# Patient Record
Sex: Female | Born: 1955 | Race: White | Hispanic: No | Smoking: Former smoker
Health system: Southern US, Community
[De-identification: ages and names within clinical notes are randomized; demographics above are authoritative.]

## PROBLEM LIST (undated history)

## (undated) DIAGNOSIS — H04129 Dry eye syndrome of unspecified lacrimal gland: Secondary | ICD-10-CM

## (undated) DIAGNOSIS — F32A Depression, unspecified: Secondary | ICD-10-CM

## (undated) DIAGNOSIS — M5432 Sciatica, left side: Secondary | ICD-10-CM

## (undated) DIAGNOSIS — I10 Essential (primary) hypertension: Secondary | ICD-10-CM

## (undated) DIAGNOSIS — R55 Syncope and collapse: Secondary | ICD-10-CM

## (undated) DIAGNOSIS — Z973 Presence of spectacles and contact lenses: Secondary | ICD-10-CM

## (undated) DIAGNOSIS — R350 Frequency of micturition: Secondary | ICD-10-CM

## (undated) DIAGNOSIS — Z9889 Other specified postprocedural states: Secondary | ICD-10-CM

## (undated) DIAGNOSIS — C439 Malignant melanoma of skin, unspecified: Secondary | ICD-10-CM

## (undated) DIAGNOSIS — I350 Nonrheumatic aortic (valve) stenosis: Secondary | ICD-10-CM

## (undated) DIAGNOSIS — Z8489 Family history of other specified conditions: Secondary | ICD-10-CM

## (undated) DIAGNOSIS — G5601 Carpal tunnel syndrome, right upper limb: Secondary | ICD-10-CM

## (undated) DIAGNOSIS — E039 Hypothyroidism, unspecified: Secondary | ICD-10-CM

## (undated) DIAGNOSIS — J449 Chronic obstructive pulmonary disease, unspecified: Secondary | ICD-10-CM

## (undated) DIAGNOSIS — G5603 Carpal tunnel syndrome, bilateral upper limbs: Secondary | ICD-10-CM

## (undated) DIAGNOSIS — F419 Anxiety disorder, unspecified: Secondary | ICD-10-CM

## (undated) DIAGNOSIS — B009 Herpesviral infection, unspecified: Secondary | ICD-10-CM

## (undated) DIAGNOSIS — R112 Nausea with vomiting, unspecified: Secondary | ICD-10-CM

## (undated) DIAGNOSIS — R011 Cardiac murmur, unspecified: Secondary | ICD-10-CM

## (undated) DIAGNOSIS — M1990 Unspecified osteoarthritis, unspecified site: Secondary | ICD-10-CM

## (undated) DIAGNOSIS — Z972 Presence of dental prosthetic device (complete) (partial): Secondary | ICD-10-CM

## (undated) DIAGNOSIS — F329 Major depressive disorder, single episode, unspecified: Secondary | ICD-10-CM

## (undated) DIAGNOSIS — C4491 Basal cell carcinoma of skin, unspecified: Secondary | ICD-10-CM

## (undated) DIAGNOSIS — K219 Gastro-esophageal reflux disease without esophagitis: Secondary | ICD-10-CM

## (undated) DIAGNOSIS — D069 Carcinoma in situ of cervix, unspecified: Secondary | ICD-10-CM

## (undated) DIAGNOSIS — M65311 Trigger thumb, right thumb: Secondary | ICD-10-CM

## (undated) DIAGNOSIS — I351 Nonrheumatic aortic (valve) insufficiency: Secondary | ICD-10-CM

## (undated) DIAGNOSIS — S12100A Unspecified displaced fracture of second cervical vertebra, initial encounter for closed fracture: Secondary | ICD-10-CM

## (undated) DIAGNOSIS — I639 Cerebral infarction, unspecified: Secondary | ICD-10-CM

## (undated) DIAGNOSIS — M797 Fibromyalgia: Secondary | ICD-10-CM

## (undated) HISTORY — DX: Depression, unspecified: F32.A

## (undated) HISTORY — DX: Cerebral infarction, unspecified: I63.9

## (undated) HISTORY — DX: Fibromyalgia: M79.7

## (undated) HISTORY — PX: TUBAL LIGATION: SHX77

## (undated) HISTORY — PX: CARPAL TUNNEL RELEASE: SHX101

## (undated) HISTORY — DX: Nonrheumatic aortic (valve) stenosis: I35.0

## (undated) HISTORY — DX: Herpesviral infection, unspecified: B00.9

## (undated) HISTORY — PX: SKIN SURGERY: SHX2413

## (undated) HISTORY — DX: Nonrheumatic aortic (valve) insufficiency: I35.1

## (undated) HISTORY — PX: OTHER SURGICAL HISTORY: SHX169

## (undated) HISTORY — DX: Major depressive disorder, single episode, unspecified: F32.9

## (undated) HISTORY — PX: MOUTH SURGERY: SHX715

## (undated) HISTORY — DX: Basal cell carcinoma of skin, unspecified: C44.91

## (undated) HISTORY — PX: FEMUR FRACTURE SURGERY: SHX633

## (undated) HISTORY — PX: WISDOM TOOTH EXTRACTION: SHX21

---

## 1982-11-25 DIAGNOSIS — I639 Cerebral infarction, unspecified: Secondary | ICD-10-CM

## 1982-11-25 DIAGNOSIS — S060XAA Concussion with loss of consciousness status unknown, initial encounter: Secondary | ICD-10-CM

## 1982-11-25 DIAGNOSIS — S329XXA Fracture of unspecified parts of lumbosacral spine and pelvis, initial encounter for closed fracture: Secondary | ICD-10-CM

## 1982-11-25 DIAGNOSIS — S12100A Unspecified displaced fracture of second cervical vertebra, initial encounter for closed fracture: Secondary | ICD-10-CM

## 1982-11-25 DIAGNOSIS — S060X9A Concussion with loss of consciousness of unspecified duration, initial encounter: Secondary | ICD-10-CM

## 1982-11-25 HISTORY — PX: MOUTH SURGERY: SHX715

## 1982-11-25 HISTORY — PX: FEMUR FRACTURE SURGERY: SHX633

## 1982-11-25 HISTORY — DX: Concussion with loss of consciousness of unspecified duration, initial encounter: S06.0X9A

## 1982-11-25 HISTORY — DX: Unspecified displaced fracture of second cervical vertebra, initial encounter for closed fracture: S12.100A

## 1982-11-25 HISTORY — DX: Cerebral infarction, unspecified: I63.9

## 1982-11-25 HISTORY — DX: Concussion with loss of consciousness status unknown, initial encounter: S06.0XAA

## 1982-11-25 HISTORY — DX: Fracture of unspecified parts of lumbosacral spine and pelvis, initial encounter for closed fracture: S32.9XXA

## 1982-11-25 HISTORY — PX: NECK SURGERY: SHX720

## 1998-10-13 ENCOUNTER — Other Ambulatory Visit: Admission: RE | Admit: 1998-10-13 | Discharge: 1998-10-13 | Payer: Self-pay | Admitting: Obstetrics and Gynecology

## 1998-10-16 ENCOUNTER — Other Ambulatory Visit: Admission: RE | Admit: 1998-10-16 | Discharge: 1998-10-16 | Payer: Self-pay | Admitting: Obstetrics and Gynecology

## 1998-11-02 ENCOUNTER — Encounter: Payer: Self-pay | Admitting: Obstetrics and Gynecology

## 1998-11-02 ENCOUNTER — Ambulatory Visit (HOSPITAL_COMMUNITY): Admission: RE | Admit: 1998-11-02 | Discharge: 1998-11-02 | Payer: Self-pay | Admitting: Obstetrics and Gynecology

## 1999-04-12 ENCOUNTER — Other Ambulatory Visit: Admission: RE | Admit: 1999-04-12 | Discharge: 1999-04-12 | Payer: Self-pay | Admitting: Obstetrics and Gynecology

## 1999-12-12 ENCOUNTER — Ambulatory Visit (HOSPITAL_COMMUNITY): Admission: RE | Admit: 1999-12-12 | Discharge: 1999-12-12 | Payer: Self-pay | Admitting: Obstetrics and Gynecology

## 1999-12-12 ENCOUNTER — Encounter: Payer: Self-pay | Admitting: Obstetrics and Gynecology

## 2000-05-30 ENCOUNTER — Ambulatory Visit (HOSPITAL_COMMUNITY): Admission: RE | Admit: 2000-05-30 | Discharge: 2000-05-30 | Payer: Self-pay | Admitting: Obstetrics and Gynecology

## 2000-12-16 ENCOUNTER — Encounter: Admission: RE | Admit: 2000-12-16 | Discharge: 2000-12-16 | Payer: Self-pay | Admitting: Obstetrics & Gynecology

## 2000-12-16 ENCOUNTER — Encounter: Payer: Self-pay | Admitting: Obstetrics & Gynecology

## 2000-12-22 ENCOUNTER — Other Ambulatory Visit: Admission: RE | Admit: 2000-12-22 | Discharge: 2000-12-22 | Payer: Self-pay | Admitting: Obstetrics & Gynecology

## 2001-05-05 ENCOUNTER — Other Ambulatory Visit: Admission: RE | Admit: 2001-05-05 | Discharge: 2001-05-05 | Payer: Self-pay | Admitting: Obstetrics & Gynecology

## 2001-08-27 ENCOUNTER — Encounter: Payer: Self-pay | Admitting: Occupational Medicine

## 2001-08-27 ENCOUNTER — Encounter: Admission: RE | Admit: 2001-08-27 | Discharge: 2001-08-27 | Payer: Self-pay | Admitting: Occupational Medicine

## 2001-10-27 ENCOUNTER — Other Ambulatory Visit: Admission: RE | Admit: 2001-10-27 | Discharge: 2001-10-27 | Payer: Self-pay | Admitting: Obstetrics & Gynecology

## 2001-11-02 ENCOUNTER — Encounter: Payer: Self-pay | Admitting: Family Medicine

## 2001-11-02 ENCOUNTER — Ambulatory Visit (HOSPITAL_COMMUNITY): Admission: RE | Admit: 2001-11-02 | Discharge: 2001-11-02 | Payer: Self-pay | Admitting: Family Medicine

## 2002-01-01 ENCOUNTER — Encounter: Admission: RE | Admit: 2002-01-01 | Discharge: 2002-01-01 | Payer: Self-pay | Admitting: Family Medicine

## 2002-01-01 ENCOUNTER — Encounter: Payer: Self-pay | Admitting: Family Medicine

## 2002-03-18 ENCOUNTER — Encounter: Payer: Self-pay | Admitting: Obstetrics & Gynecology

## 2002-03-18 ENCOUNTER — Encounter: Admission: RE | Admit: 2002-03-18 | Discharge: 2002-03-18 | Payer: Self-pay | Admitting: Obstetrics & Gynecology

## 2002-08-03 ENCOUNTER — Other Ambulatory Visit: Admission: RE | Admit: 2002-08-03 | Discharge: 2002-08-03 | Payer: Self-pay | Admitting: Obstetrics & Gynecology

## 2002-11-15 ENCOUNTER — Encounter: Payer: Self-pay | Admitting: Family Medicine

## 2002-11-15 ENCOUNTER — Encounter: Admission: RE | Admit: 2002-11-15 | Discharge: 2002-11-15 | Payer: Self-pay | Admitting: Family Medicine

## 2003-05-02 ENCOUNTER — Encounter: Payer: Self-pay | Admitting: Obstetrics & Gynecology

## 2003-05-02 ENCOUNTER — Encounter: Admission: RE | Admit: 2003-05-02 | Discharge: 2003-05-02 | Payer: Self-pay | Admitting: Obstetrics & Gynecology

## 2003-05-16 ENCOUNTER — Other Ambulatory Visit: Admission: RE | Admit: 2003-05-16 | Discharge: 2003-05-16 | Payer: Self-pay | Admitting: Obstetrics & Gynecology

## 2003-11-29 ENCOUNTER — Other Ambulatory Visit: Admission: RE | Admit: 2003-11-29 | Discharge: 2003-11-29 | Payer: Self-pay | Admitting: Obstetrics & Gynecology

## 2005-02-01 ENCOUNTER — Other Ambulatory Visit: Admission: RE | Admit: 2005-02-01 | Discharge: 2005-02-01 | Payer: Self-pay | Admitting: Obstetrics & Gynecology

## 2008-08-03 ENCOUNTER — Other Ambulatory Visit: Admission: RE | Admit: 2008-08-03 | Discharge: 2008-08-03 | Payer: Self-pay | Admitting: Obstetrics & Gynecology

## 2008-08-31 ENCOUNTER — Encounter: Admission: RE | Admit: 2008-08-31 | Discharge: 2008-08-31 | Payer: Self-pay | Admitting: Obstetrics & Gynecology

## 2009-11-06 ENCOUNTER — Ambulatory Visit (HOSPITAL_COMMUNITY): Admission: RE | Admit: 2009-11-06 | Discharge: 2009-11-06 | Payer: Self-pay | Admitting: Obstetrics & Gynecology

## 2010-01-05 ENCOUNTER — Ambulatory Visit: Payer: Self-pay | Admitting: Vascular Surgery

## 2010-11-25 HISTORY — PX: CARPAL TUNNEL RELEASE: SHX101

## 2010-12-16 ENCOUNTER — Encounter: Payer: Self-pay | Admitting: Obstetrics & Gynecology

## 2011-01-01 ENCOUNTER — Emergency Department: Payer: Self-pay | Admitting: Unknown Physician Specialty

## 2011-03-01 ENCOUNTER — Other Ambulatory Visit: Payer: Self-pay | Admitting: Obstetrics & Gynecology

## 2011-03-01 DIAGNOSIS — Z139 Encounter for screening, unspecified: Secondary | ICD-10-CM

## 2011-03-07 ENCOUNTER — Ambulatory Visit (HOSPITAL_COMMUNITY)
Admission: RE | Admit: 2011-03-07 | Discharge: 2011-03-07 | Disposition: A | Discharge status: Home / Self Care | Payer: Medicare Other | Source: Ambulatory Visit | Attending: Obstetrics & Gynecology | Admitting: Obstetrics & Gynecology

## 2011-03-07 DIAGNOSIS — Z1231 Encounter for screening mammogram for malignant neoplasm of breast: Secondary | ICD-10-CM | POA: Insufficient documentation

## 2011-03-07 DIAGNOSIS — Z139 Encounter for screening, unspecified: Secondary | ICD-10-CM

## 2011-04-09 NOTE — Procedures (Signed)
CAROTID DUPLEX EXAM   INDICATION:  Right carotid bruit.   HISTORY:  Diabetes:  No.  Cardiac:  No.  Hypertension:  No.  Smoking:  Yes.  Previous Surgery:  No.  CV History:  CVA in 1984.  Amaurosis Fugax No, Paresthesias No, Hemiparesis No                                       RIGHT             LEFT  Brachial systolic pressure:         132               138  Brachial Doppler waveforms:         WNL               WNL  Vertebral direction of flow:        Antegrade         Antegrade  DUPLEX VELOCITIES (cm/sec)  CCA peak systolic                   128               128  ECA peak systolic                   77                120  ICA peak systolic                   117               137  ICA end diastolic                   34                46  PLAQUE MORPHOLOGY:                  Heterogeneous     Heterogeneous  PLAQUE AMOUNT:                      Mild              Mild  PLAQUE LOCATION:                    ICA               ICA   IMPRESSION:  1. There appears to be 20%-39% stenosis at the right internal carotid      artery.  2. There appears to be 40%-59% stenosis at the left internal carotid      artery.  3. Antegrade flow in bilateral vertebrals.   ___________________________________________  Larina Earthly, M.D.   CB/MEDQ  D:  01/05/2010  T:  01/06/2010  Job:  [id number]

## 2011-04-12 NOTE — Op Note (Signed)
[facility]  Patient:    Jessica Ramsey, GERO                       MRN: [id number] Proc. Date: 05/30/00 Adm. Date:  [id number] Attending:  Jenean Lindau                           Operative Report  PREOPERATIVE DIAGNOSIS:  Desires permanent sterilization.  POSTOPERATIVE DIAGNOSIS:  Desires permanent sterilization, plus pelvic adhesions.  OPERATION:  Bilateral laparoscopic tubal cauterization with lysis of adhesions.  SURGEON:  Laqueta Linden, M.D.  ANESTHESIA:  General endotracheal.  ESTIMATED BLOOD LOSS:  Less than 10 cc.  SPECIMENS:  None.  COMPLICATIONS:  None.  INDICATIONS FOR OPERATION:  Jessica Ramsey is a 55 year old gravida 1, para 1 female on progesterone only contraceptive who recently had an IUD removed for heavy and crampy menses.  She desires tubal sterilization for permanent contraception.  She was then counseled as to the risks, benefits, alternatives and complications of the procedure and agrees to proceed.  She understands the procedure is considered to be permanent and irreversible.  She also understands there is an approximately 1 in 200 failure rate with an increased risk of ectopic pregnancy.  She has given full consent.  DESCRIPTION OF OPERATION:  The patient was taken to the operating room.  After proper identification, she was placed on the operating table in the supine position.  After the induction of general endotracheal anesthesia, she was placed in the stirrups and the abdomen, perineum and vagina were prepped and draped in the routine sterile fashion.  The uterus is anterior, mobile and normal size.  A halter tenaculum was placed on the anterior lip of the cervix. The bladder was emptied with a red rubber catheter preoperatively.  Attention was then turned abdominally.  A 2 cm infraumbilical incision was made.  The Veress needle was inserted into the peritoneal cavity. Intraperitoneal placement was confirmed by  hanging drop and saline instillation tests.  Three liters of CO2 were then infused to create a pneumoperitoneum.  The Veress needle was then removed.  The 10-11 disposable trocar was then inserted into the peritoneal cavity without difficulty. Incision of the laparoscope with attached video and continuous CO2 infusion was then done.  Systematic exploration of the upper abdomen revealed a liver edge with a descended gallbladder.  The appendix was visualized and totally normal in appearance.  In the pelvis there was noted to be dense adhesions of omentum to the anterior abdominal wall in the midline and to the anterior fundus of the uterus just above the bladder reflection (The patient had a prior cesarean section).  This actually limited uterine mobility and adequate visualization of the fallopian tubes.  For this reason, the monopolar cautery was attached with appropriate patient grounding and the monopolar cautery scissors were then used to cauterize and cut these adhesions to the edge of the abdominal wall and also to the uterine fundus.  Care was taken to leave a small amount of omentum on the fundus, such that the bladder flap was not disturbed. In order to facilitate this dissection, a 5 mm port was placed suprapubically where a Hasson suction irrigator was placed. There was a small amount of bleeding from the omental edge after the adhesions was freed up. This allowed much improved mobility of the uterus such that both fallopian tubes could be visualized.  Tubal sterilization was then performed using  the Clevenger forceps in a routine manner.  Tube was identified, grasped 2-3 cm from the cornua and successive distal cauterizations were performed until the AMP meter read zero.  There was a total cauterized length of 2-3 cm bilaterally and cautery was noted to extend into the medial salpinges on both sides of the tube.  An identical procedure was carried out on the opposite side.  At  this point, copious irrigation was performed.  The uterine and abdominal sites of the adhesiolysis were hemostatic.  The omental portion that had been freed up from the adhesions was located and had a small adherent clot with no bleeding at all. Several hundred cc of lactated ringers were left in the peritoneal cavity in an effort to decrease likelihood of recurrent adhesion formation.  Both tubal cautery sites were hemostatic.  There was no active bleeding noted within the pelvis.  The suprapubic port was removed and noted to be hemostatic.  The pneumoperitoneum was allowed to escape. Laparoscope was then withdrawn under direct vision with no obvious injury or active bleeding identified.  The incisions were closed with subcuticular sutures of 4-0 Dexon.  The incisions were injected with a total of 11 cc of 0.25% plain Marcaine.  Steri-Strips and pressure dressings were applied.  The patient was stable and transferred to the recovery room.  She will be observed and discharged per anesthesia.  She is to follow up in the office in 4-6 weeks time. She is to use backup contraception for the next month.  She has Darvocet and hydrocodone at home and can take Advil or Aleve as needed.  She is to call prior to her follow-up visit for excessive pain, fever, bleeding, nausea, vomiting or other concerns. DD:  05/30/00 TD:  05/30/00 Job: [id number]

## 2011-07-07 ENCOUNTER — Emergency Department: Payer: Self-pay | Admitting: Emergency Medicine

## 2011-12-18 ENCOUNTER — Other Ambulatory Visit: Payer: Self-pay | Admitting: Neurosurgery

## 2011-12-18 DIAGNOSIS — M25511 Pain in right shoulder: Secondary | ICD-10-CM

## 2011-12-27 ENCOUNTER — Other Ambulatory Visit: Payer: Medicare Other

## 2011-12-28 ENCOUNTER — Ambulatory Visit
Admission: RE | Admit: 2011-12-28 | Discharge: 2011-12-28 | Disposition: A | Discharge status: Home / Self Care | Payer: Medicare Other | Source: Ambulatory Visit | Attending: Neurosurgery | Admitting: Neurosurgery

## 2011-12-28 DIAGNOSIS — M25511 Pain in right shoulder: Secondary | ICD-10-CM

## 2012-06-10 ENCOUNTER — Other Ambulatory Visit: Payer: Self-pay | Admitting: Obstetrics & Gynecology

## 2012-06-10 DIAGNOSIS — Z139 Encounter for screening, unspecified: Secondary | ICD-10-CM

## 2012-06-30 ENCOUNTER — Ambulatory Visit (HOSPITAL_COMMUNITY): Payer: Medicare Other

## 2012-07-06 ENCOUNTER — Ambulatory Visit (HOSPITAL_COMMUNITY)
Admission: RE | Admit: 2012-07-06 | Discharge: 2012-07-06 | Disposition: A | Discharge status: Home / Self Care | Payer: Medicare Other | Source: Ambulatory Visit | Attending: Obstetrics & Gynecology | Admitting: Obstetrics & Gynecology

## 2012-07-06 DIAGNOSIS — Z1231 Encounter for screening mammogram for malignant neoplasm of breast: Secondary | ICD-10-CM | POA: Insufficient documentation

## 2012-07-06 DIAGNOSIS — Z139 Encounter for screening, unspecified: Secondary | ICD-10-CM

## 2012-07-26 DIAGNOSIS — C4491 Basal cell carcinoma of skin, unspecified: Secondary | ICD-10-CM

## 2012-07-26 HISTORY — DX: Basal cell carcinoma of skin, unspecified: C44.91

## 2012-09-03 ENCOUNTER — Emergency Department (HOSPITAL_COMMUNITY)
Admission: EM | Admit: 2012-09-03 | Discharge: 2012-09-03 | Disposition: A | Discharge status: Home / Self Care | Payer: Medicare Other | Attending: Emergency Medicine | Admitting: Emergency Medicine

## 2012-09-03 ENCOUNTER — Emergency Department (HOSPITAL_COMMUNITY): Payer: Medicare Other

## 2012-09-03 ENCOUNTER — Encounter (HOSPITAL_COMMUNITY): Payer: Self-pay | Admitting: *Deleted

## 2012-09-03 DIAGNOSIS — Z79899 Other long term (current) drug therapy: Secondary | ICD-10-CM | POA: Insufficient documentation

## 2012-09-03 DIAGNOSIS — Z96649 Presence of unspecified artificial hip joint: Secondary | ICD-10-CM | POA: Insufficient documentation

## 2012-09-03 DIAGNOSIS — Z888 Allergy status to other drugs, medicaments and biological substances status: Secondary | ICD-10-CM | POA: Insufficient documentation

## 2012-09-03 DIAGNOSIS — F17200 Nicotine dependence, unspecified, uncomplicated: Secondary | ICD-10-CM | POA: Insufficient documentation

## 2012-09-03 DIAGNOSIS — M542 Cervicalgia: Secondary | ICD-10-CM | POA: Insufficient documentation

## 2012-09-03 DIAGNOSIS — Z880 Allergy status to penicillin: Secondary | ICD-10-CM | POA: Insufficient documentation

## 2012-09-03 DIAGNOSIS — S161XXA Strain of muscle, fascia and tendon at neck level, initial encounter: Secondary | ICD-10-CM

## 2012-09-03 MED ORDER — HYDROCODONE-ACETAMINOPHEN 5-325 MG PO TABS: 1.0000 | ORAL_TABLET | Freq: Four times a day (QID) | ORAL | Status: DC | PRN

## 2012-09-03 MED ORDER — HYDROCODONE-ACETAMINOPHEN 5-325 MG PO TABS
1.0000 | ORAL_TABLET | Freq: Once | ORAL | Status: DC
  Filled 2012-09-03: qty 1

## 2012-09-03 MED ORDER — CYCLOBENZAPRINE HCL 10 MG PO TABS: 10.0000 mg | ORAL_TABLET | Freq: Three times a day (TID) | ORAL | Status: DC | PRN
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
## 2012-09-03 NOTE — ED Provider Notes (Signed)
Medical screening examination/treatment/procedure(s) were performed by non-physician practitioner and as supervising physician I was immediately available for consultation/collaboration.  Koralee Wedeking R. Elvina Bosch, MD 09/03/12 1615 

## 2013-03-23 ENCOUNTER — Encounter: Payer: Self-pay | Admitting: Obstetrics & Gynecology

## 2013-03-24 ENCOUNTER — Ambulatory Visit: Payer: Self-pay | Admitting: Obstetrics & Gynecology

## 2013-03-24 ENCOUNTER — Encounter: Payer: Self-pay | Admitting: Obstetrics & Gynecology

## 2013-03-24 ENCOUNTER — Ambulatory Visit (INDEPENDENT_AMBULATORY_CARE_PROVIDER_SITE_OTHER): Payer: Medicare Other | Admitting: Obstetrics & Gynecology

## 2013-03-24 VITALS — BP 142/80 | HR 72 | Resp 16

## 2013-03-24 DIAGNOSIS — R6889 Other general symptoms and signs: Secondary | ICD-10-CM

## 2013-03-24 DIAGNOSIS — IMO0002 Reserved for concepts with insufficient information to code with codable children: Secondary | ICD-10-CM

## 2013-03-24 DIAGNOSIS — R87619 Unspecified abnormal cytological findings in specimens from cervix uteri: Secondary | ICD-10-CM

## 2013-03-24 NOTE — Progress Notes (Signed)
56 yrs DWF G1P1001here for repeat pap smear due to ASCUS and AGUS Pap smear.  HR HPV was negative.  Colpo was done 09/08/13 showing chronic inflammation.  ECC showed atrphic endometrial glands.  No evidence of dysplasia or malignancy was seen. Denies problems or vaginal symptoms or STD concerns.  LMP:  2003    Contraception: sterilization  General appearance: Healthy female    Exam:Pelvic exam: VULVA: normal appearing vulva with no masses, tenderness or lesions, VAGINA: normal appearing vagina with normal color and discharge, no lesions, atrophic, CERVIX: normal appearing cervix without discharge or lesions, UTERUS: uterus is normal size, shape, consistency and nontender. Pap smear obtained.  Assessment:  ASCUS and AGUS Pap with neg HR HPV  Plan: Pap obtained.  Will notify patient of results and follow-up when results are finalized.  Marland Kitchen

## 2013-03-24 NOTE — Patient Instructions (Signed)
We will call with your results and recommended follow-up.

## 2013-03-26 ENCOUNTER — Ambulatory Visit: Payer: Self-pay | Admitting: Obstetrics & Gynecology

## 2013-07-22 ENCOUNTER — Telehealth: Payer: Self-pay | Admitting: Obstetrics & Gynecology

## 2013-07-22 ENCOUNTER — Other Ambulatory Visit: Payer: Self-pay | Admitting: Obstetrics & Gynecology

## 2013-07-22 ENCOUNTER — Other Ambulatory Visit (HOSPITAL_COMMUNITY): Payer: Self-pay | Admitting: Emergency Medicine

## 2013-07-22 DIAGNOSIS — Z139 Encounter for screening, unspecified: Secondary | ICD-10-CM

## 2013-07-22 DIAGNOSIS — Z780 Asymptomatic menopausal state: Secondary | ICD-10-CM

## 2013-07-22 NOTE — Telephone Encounter (Signed)
Patient needs a order for a Bone Density Test  Before she sees Dr. Hyacinth Meeker in Nov . Per Dr. Hyacinth Meeker.   [facility] [phone]

## 2013-07-23 NOTE — Telephone Encounter (Signed)
Order entered

## 2013-07-23 NOTE — Telephone Encounter (Addendum)
Spoke with tech at [employer] [employer]. They are a part of [facility] and are on Epic EMR, so order for BMD can be sent electronically. OK for BMD? Chart on your desk.

## 2013-08-31 ENCOUNTER — Ambulatory Visit (HOSPITAL_COMMUNITY)
Admission: RE | Admit: 2013-08-31 | Discharge: 2013-08-31 | Disposition: A | Discharge status: Home / Self Care | Payer: Medicare Other | Source: Ambulatory Visit | Attending: Obstetrics & Gynecology | Admitting: Obstetrics & Gynecology

## 2013-08-31 DIAGNOSIS — Z1231 Encounter for screening mammogram for malignant neoplasm of breast: Secondary | ICD-10-CM | POA: Insufficient documentation

## 2013-08-31 DIAGNOSIS — Z780 Asymptomatic menopausal state: Secondary | ICD-10-CM | POA: Insufficient documentation

## 2013-08-31 DIAGNOSIS — Z139 Encounter for screening, unspecified: Secondary | ICD-10-CM

## 2013-09-02 ENCOUNTER — Telehealth: Payer: Self-pay

## 2013-09-02 NOTE — Telephone Encounter (Signed)
Lmtcb//kn 

## 2013-09-02 NOTE — Telephone Encounter (Signed)
Message copied by Elisha Headland on Thu Sep 02, 2013  3:31 PM ------      Message from: Jerene Bears      Created: Wed Sep 01, 2013 10:32 AM       Inform BMD totally normal.  Repeat 3-5 yrs. ------

## 2013-09-07 NOTE — Telephone Encounter (Signed)
Patient notified of BMD results. 

## 2013-09-10 ENCOUNTER — Other Ambulatory Visit: Payer: Self-pay | Admitting: Orthopedic Surgery

## 2013-09-10 DIAGNOSIS — F17200 Nicotine dependence, unspecified, uncomplicated: Secondary | ICD-10-CM

## 2013-09-10 DIAGNOSIS — R079 Chest pain, unspecified: Secondary | ICD-10-CM

## 2013-09-10 DIAGNOSIS — M5030 Other cervical disc degeneration, unspecified cervical region: Secondary | ICD-10-CM

## 2013-09-20 ENCOUNTER — Ambulatory Visit
Admission: RE | Admit: 2013-09-20 | Discharge: 2013-09-20 | Disposition: A | Discharge status: Home / Self Care | Payer: Medicare Other | Source: Ambulatory Visit | Attending: Orthopedic Surgery | Admitting: Orthopedic Surgery

## 2013-09-20 DIAGNOSIS — R079 Chest pain, unspecified: Secondary | ICD-10-CM

## 2013-09-20 DIAGNOSIS — F17200 Nicotine dependence, unspecified, uncomplicated: Secondary | ICD-10-CM

## 2013-09-20 DIAGNOSIS — M5030 Other cervical disc degeneration, unspecified cervical region: Secondary | ICD-10-CM

## 2013-09-30 ENCOUNTER — Ambulatory Visit (INDEPENDENT_AMBULATORY_CARE_PROVIDER_SITE_OTHER): Payer: Medicare Other | Admitting: Obstetrics & Gynecology

## 2013-09-30 ENCOUNTER — Encounter: Payer: Self-pay | Admitting: Obstetrics & Gynecology

## 2013-09-30 VITALS — BP 146/72 | HR 64 | Resp 16 | Ht 61.25 in | Wt 125.0 lb

## 2013-09-30 DIAGNOSIS — IMO0002 Reserved for concepts with insufficient information to code with codable children: Secondary | ICD-10-CM

## 2013-09-30 DIAGNOSIS — Z124 Encounter for screening for malignant neoplasm of cervix: Secondary | ICD-10-CM

## 2013-09-30 DIAGNOSIS — R87619 Unspecified abnormal cytological findings in specimens from cervix uteri: Secondary | ICD-10-CM

## 2013-09-30 DIAGNOSIS — Z01419 Encounter for gynecological examination (general) (routine) without abnormal findings: Secondary | ICD-10-CM
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
## 2013-09-30 NOTE — Patient Instructions (Addendum)

## 2013-10-04 LAB — IPS PAP TEST WITH HPV

## 2013-10-07 ENCOUNTER — Telehealth: Payer: Self-pay

## 2013-10-07 NOTE — Telephone Encounter (Signed)
lmtcb

## 2013-10-07 NOTE — Telephone Encounter (Signed)
Message copied by Elisha Headland on Thu Oct 07, 2013  2:40 PM ------      Message from: Jerene Bears      Created: Wed Oct 06, 2013  5:43 AM       Inform pap neg and HR HPV neg.  H/O AGUS pap with negative w/u last year.  Repeat Pap and HR HPV 1 year.  Out of recall.  Enter 08 recall for one year.   Thanks. ------

## 2013-10-08 NOTE — Telephone Encounter (Signed)
Pt returning call. Says its ok to leave a detailed message on vm if need to.

## 2013-10-08 NOTE — Telephone Encounter (Signed)
Patient notified of all results. 

## 2013-10-12 NOTE — Telephone Encounter (Signed)
Patient notified

## 2014-08-02 ENCOUNTER — Other Ambulatory Visit: Payer: Self-pay

## 2014-08-02 DIAGNOSIS — Z1231 Encounter for screening mammogram for malignant neoplasm of breast: Secondary | ICD-10-CM

## 2014-09-26 ENCOUNTER — Encounter: Payer: Self-pay | Admitting: Obstetrics & Gynecology

## 2014-10-04 ENCOUNTER — Ambulatory Visit
Admission: RE | Admit: 2014-10-04 | Discharge: 2014-10-04 | Disposition: A | Discharge status: Home / Self Care | Payer: Medicare HMO | Source: Ambulatory Visit

## 2014-10-04 ENCOUNTER — Encounter (INDEPENDENT_AMBULATORY_CARE_PROVIDER_SITE_OTHER): Payer: Self-pay

## 2014-10-04 DIAGNOSIS — Z1231 Encounter for screening mammogram for malignant neoplasm of breast: Secondary | ICD-10-CM

## 2014-11-01 ENCOUNTER — Ambulatory Visit: Payer: Medicare Other | Admitting: Obstetrics & Gynecology

## 2014-11-30 ENCOUNTER — Telehealth: Payer: Self-pay | Admitting: *Deleted

## 2014-11-30 NOTE — Telephone Encounter (Signed)
08 Recall for:  Previous history of AGUS and ASCUS pap 08/10/12  Prior history of: 09/30/13, Negative with neg HR HPV, repeat in one year 03/24/13, negative with neg HR HPV, repeat in 6 months 09/08/12, colpo showing chronic inflammation. ECC showed atrophic endometrial glands  Pt cancelled 11/01/14 AEX with Dr. Sabra Heck.  Please call patient to schedule AEX appt.

## 2014-11-30 NOTE — Telephone Encounter (Signed)
Left Message To Call Back  

## 2014-12-02 NOTE — Telephone Encounter (Signed)
Called patient she is completely booked for Doctor's appointment for this month, scheduled her for AEX 01/12/14 with Dr. Sabra Heck.

## 2014-12-02 NOTE — Telephone Encounter (Signed)
Pt has AEX on 01/12/15.  Recall changed to 01/22/15.  Routing encounter to provider for final review.  Closing encounter.

## 2015-01-12 ENCOUNTER — Encounter: Payer: Self-pay | Admitting: Obstetrics & Gynecology

## 2015-01-12 ENCOUNTER — Ambulatory Visit (INDEPENDENT_AMBULATORY_CARE_PROVIDER_SITE_OTHER): Payer: Commercial Managed Care - HMO | Admitting: Obstetrics & Gynecology

## 2015-01-12 VITALS — BP 158/68 | HR 68 | Resp 12 | Ht 59.75 in | Wt 117.2 lb

## 2015-01-12 DIAGNOSIS — Z01419 Encounter for gynecological examination (general) (routine) without abnormal findings: Secondary | ICD-10-CM | POA: Diagnosis not present

## 2015-01-12 DIAGNOSIS — E038 Other specified hypothyroidism: Secondary | ICD-10-CM | POA: Diagnosis not present

## 2015-01-12 DIAGNOSIS — Z124 Encounter for screening for malignant neoplasm of cervix: Secondary | ICD-10-CM | POA: Diagnosis not present

## 2015-01-12 DIAGNOSIS — R87619 Unspecified abnormal cytological findings in specimens from cervix uteri: Secondary | ICD-10-CM | POA: Diagnosis not present

## 2015-01-12 LAB — TSH: TSH: 1.609 u[IU]/mL (ref 0.350–4.500)

## 2015-01-12 MED ORDER — ACYCLOVIR 400 MG PO TABS: 400.0000 mg | ORAL_TABLET | ORAL | Status: DC | PRN
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
## 2015-01-13 LAB — IPS PAP TEST WITH REFLEX TO HPV

## 2015-06-26 DIAGNOSIS — M797 Fibromyalgia: Secondary | ICD-10-CM

## 2015-06-26 HISTORY — DX: Fibromyalgia: M79.7

## 2015-09-29 ENCOUNTER — Other Ambulatory Visit: Payer: Self-pay

## 2015-09-29 DIAGNOSIS — Z1231 Encounter for screening mammogram for malignant neoplasm of breast: Secondary | ICD-10-CM

## 2015-10-16 ENCOUNTER — Ambulatory Visit
Admission: RE | Admit: 2015-10-16 | Discharge: 2015-10-16 | Disposition: A | Discharge status: Home / Self Care | Payer: Medicare HMO | Source: Ambulatory Visit

## 2015-10-16 DIAGNOSIS — Z1231 Encounter for screening mammogram for malignant neoplasm of breast: Secondary | ICD-10-CM

## 2015-12-12 ENCOUNTER — Emergency Department (HOSPITAL_COMMUNITY)
Admission: EM | Admit: 2015-12-12 | Discharge: 2015-12-13 | Disposition: A | Discharge status: Home / Self Care | Payer: Medicare HMO | Attending: Emergency Medicine | Admitting: Emergency Medicine

## 2015-12-12 ENCOUNTER — Encounter (HOSPITAL_COMMUNITY): Payer: Self-pay | Admitting: Emergency Medicine

## 2015-12-12 DIAGNOSIS — S8992XA Unspecified injury of left lower leg, initial encounter: Secondary | ICD-10-CM | POA: Diagnosis not present

## 2015-12-12 DIAGNOSIS — Y998 Other external cause status: Secondary | ICD-10-CM | POA: Diagnosis not present

## 2015-12-12 DIAGNOSIS — X501XXA Overexertion from prolonged static or awkward postures, initial encounter: Secondary | ICD-10-CM | POA: Diagnosis not present

## 2015-12-12 DIAGNOSIS — Y9289 Other specified places as the place of occurrence of the external cause: Secondary | ICD-10-CM | POA: Diagnosis not present

## 2015-12-12 DIAGNOSIS — M25562 Pain in left knee: Secondary | ICD-10-CM

## 2015-12-12 DIAGNOSIS — Z79899 Other long term (current) drug therapy: Secondary | ICD-10-CM | POA: Insufficient documentation

## 2015-12-12 DIAGNOSIS — Z8619 Personal history of other infectious and parasitic diseases: Secondary | ICD-10-CM | POA: Insufficient documentation

## 2015-12-12 DIAGNOSIS — Z85828 Personal history of other malignant neoplasm of skin: Secondary | ICD-10-CM | POA: Insufficient documentation

## 2015-12-12 DIAGNOSIS — Y9389 Activity, other specified: Secondary | ICD-10-CM | POA: Insufficient documentation

## 2015-12-12 DIAGNOSIS — Z880 Allergy status to penicillin: Secondary | ICD-10-CM | POA: Insufficient documentation

## 2015-12-12 DIAGNOSIS — Z8673 Personal history of transient ischemic attack (TIA), and cerebral infarction without residual deficits: Secondary | ICD-10-CM | POA: Insufficient documentation

## 2015-12-12 DIAGNOSIS — F17210 Nicotine dependence, cigarettes, uncomplicated: Secondary | ICD-10-CM | POA: Insufficient documentation

## 2015-12-12 DIAGNOSIS — F329 Major depressive disorder, single episode, unspecified: Secondary | ICD-10-CM | POA: Insufficient documentation

## 2015-12-12 DIAGNOSIS — M1712 Unilateral primary osteoarthritis, left knee: Secondary | ICD-10-CM | POA: Diagnosis not present

## 2015-12-12 MED ORDER — DEXAMETHASONE 4 MG PO TABS: 4.0000 mg | ORAL_TABLET | Freq: Two times a day (BID) | ORAL | Status: DC

## 2015-12-12 MED ORDER — DEXAMETHASONE SODIUM PHOSPHATE 4 MG/ML IJ SOLN
8.0000 mg | Freq: Once | INTRAMUSCULAR | Status: AC
  Administered 2015-12-12: 8 mg via INTRAMUSCULAR
  Filled 2015-12-12: qty 2

## 2015-12-12 MED ORDER — METHOCARBAMOL 500 MG PO TABS
1000.0000 mg | ORAL_TABLET | Freq: Once | ORAL | Status: AC
  Administered 2015-12-12: 1000 mg via ORAL
  Filled 2015-12-12: qty 2

## 2015-12-12 MED ORDER — HYDROCODONE-ACETAMINOPHEN 5-325 MG PO TABS
2.0000 | ORAL_TABLET | Freq: Once | ORAL | Status: AC
  Administered 2015-12-12: 1 via ORAL
  Filled 2015-12-12: qty 2

## 2015-12-12 MED ORDER — PROMETHAZINE HCL 12.5 MG PO TABS
25.0000 mg | ORAL_TABLET | Freq: Once | ORAL | Status: AC
  Administered 2015-12-12: 25 mg via ORAL
  Filled 2015-12-12: qty 2

## 2015-12-12 MED ORDER — CARISOPRODOL 350 MG PO TABS: ORAL_TABLET | ORAL | Status: DC

## 2015-12-12 NOTE — ED Notes (Signed)
Patient only wanted to take 1 norco, other norco pill returned to pyxis.
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
## 2015-12-12 NOTE — Discharge Instructions (Signed)
Heating pad to your left lower extremity may be helpful. Please continue your current pain medications. Add Soma and Decadron, until seen by Dr. Deatra Ina, or member of his team. Please use the knee immobilizer for the next 3 days, and then began resuming knee exercises. Heat Therapy Heat therapy can help ease sore, stiff, injured, and tight muscles and joints. Heat relaxes your muscles, which may help ease your pain. Heat therapy should only be used on old, pre-existing, or long-lasting (chronic) injuries. Do not use heat therapy unless told by your doctor. HOW TO USE HEAT THERAPY There are several different kinds of heat therapy, including:  Moist heat pack.  Warm water bath.  Hot water bottle.  Electric heating pad.  Heated gel pack.  Heated wrap.  Electric heating pad. GENERAL HEAT THERAPY RECOMMENDATIONS   Do not sleep while using heat therapy. Only use heat therapy while you are awake.  Your skin may turn pink while using heat therapy. Do not use heat therapy if your skin turns red.  Do not use heat therapy if you have new pain.  High heat or long exposure to heat can cause burns. Be careful when using heat therapy to avoid burning your skin.  Do not use heat therapy on areas of your skin that are already irritated, such as with a rash or sunburn. GET HELP IF:   You have blisters, redness, swelling (puffiness), or numbness.  You have new pain.  Your pain is worse. MAKE SURE YOU:  Understand these instructions.  Will watch your condition.  Will get help right away if you are not doing well or get worse.   This information is not intended to replace advice given to you by your health care provider. Make sure you discuss any questions you have with your health care provider.   Document Released: 02/03/2012 Document Revised: 12/02/2014 Document Reviewed: 01/04/2014 Elsevier Interactive Patient Education 2016 Elsevier Inc.  Osteoarthritis Osteoarthritis is a disease  that causes soreness and inflammation of a joint. It occurs when the cartilage at the affected joint wears down. Cartilage acts as a cushion, covering the ends of bones where they meet to form a joint. Osteoarthritis is the most common form of arthritis. It often occurs in older people. The joints affected most often by this condition include those in the:  Ends of the fingers.  Thumbs.  Neck.  Lower back.  Knees.  Hips. CAUSES  Over time, the cartilage that covers the ends of bones begins to wear away. This causes bone to rub on bone, producing pain and stiffness in the affected joints.  RISK FACTORS Certain factors can increase your chances of having osteoarthritis, including:  Older age.  Excessive body weight.  Overuse of joints.  Previous joint injury. SIGNS AND SYMPTOMS   Pain, swelling, and stiffness in the joint.  Over time, the joint may lose its normal shape.  Small deposits of bone (osteophytes) may grow on the edges of the joint.  Bits of bone or cartilage can break off and float inside the joint space. This may cause more pain and damage. DIAGNOSIS  Your health care provider will do a physical exam and ask about your symptoms. Various tests may be ordered, such as:  X-rays of the affected joint.  Blood tests to rule out other types of arthritis. Additional tests may be used to diagnose your condition. TREATMENT  Goals of treatment are to control pain and improve joint function. Treatment plans may include:  A prescribed  exercise program that allows for rest and joint relief.  A weight control plan.  Pain relief techniques, such as:  Properly applied heat and cold.  Electric pulses delivered to nerve endings under the skin (transcutaneous electrical nerve stimulation [TENS]).  Massage.  Certain nutritional supplements.  Medicines to control pain, such as:  Acetaminophen.  Nonsteroidal anti-inflammatory drugs (NSAIDs), such as  naproxen.  Narcotic or central-acting agents, such as tramadol.  Corticosteroids. These can be given orally or as an injection.  Surgery to reposition the bones and relieve pain (osteotomy) or to remove loose pieces of bone and cartilage. Joint replacement may be needed in advanced states of osteoarthritis. HOME CARE INSTRUCTIONS   Take medicines only as directed by your health care provider.  Maintain a healthy weight. Follow your health care provider's instructions for weight control. This may include dietary instructions.  Exercise as directed. Your health care provider can recommend specific types of exercise. These may include:  Strengthening exercises. These are done to strengthen the muscles that support joints affected by arthritis. They can be performed with weights or with exercise bands to add resistance.  Aerobic activities. These are exercises, such as brisk walking or low-impact aerobics, that get your heart pumping.  Range-of-motion activities. These keep your joints limber.  Balance and agility exercises. These help you maintain daily living skills.  Rest your affected joints as directed by your health care provider.  Keep all follow-up visits as directed by your health care provider. SEEK MEDICAL CARE IF:   Your skin turns red.  You develop a rash in addition to your joint pain.  You have worsening joint pain.  You have a fever along with joint or muscle aches. SEEK IMMEDIATE MEDICAL CARE IF:  You have a significant loss of weight or appetite.  You have night sweats. [place] of Arthritis and Musculoskeletal and Skin Diseases: www.niams.SouthExposed.es  [employer] on Aging: http://kim-miller.com/  American College of Rheumatology: www.rheumatology.org   This information is not intended to replace advice given to you by your health care provider. Make sure you discuss any questions you have with your health care provider.    Document Released: 11/11/2005 Document Revised: 12/02/2014 Document Reviewed: 07/19/2013 Elsevier Interactive Patient Education [employer].

## 2016-01-15 ENCOUNTER — Telehealth: Payer: Self-pay | Admitting: Obstetrics & Gynecology

## 2016-01-15 ENCOUNTER — Ambulatory Visit: Payer: Commercial Managed Care - HMO | Admitting: Obstetrics & Gynecology

## 2016-01-15 NOTE — Telephone Encounter (Signed)
Patient arrived to 1:00pm aex appointment and canceled this appointment. Patient did not wish to reschedule. Staff message sent to [facility].

## 2016-01-24 DIAGNOSIS — I10 Essential (primary) hypertension: Secondary | ICD-10-CM | POA: Insufficient documentation

## 2016-01-24 DIAGNOSIS — G549 Nerve root and plexus disorder, unspecified: Secondary | ICD-10-CM | POA: Insufficient documentation

## 2016-01-24 DIAGNOSIS — A609 Anogenital herpesviral infection, unspecified: Secondary | ICD-10-CM | POA: Insufficient documentation

## 2016-01-24 DIAGNOSIS — M48061 Spinal stenosis, lumbar region without neurogenic claudication: Secondary | ICD-10-CM | POA: Insufficient documentation

## 2016-01-24 DIAGNOSIS — F329 Major depressive disorder, single episode, unspecified: Secondary | ICD-10-CM | POA: Insufficient documentation

## 2016-01-24 DIAGNOSIS — K219 Gastro-esophageal reflux disease without esophagitis: Secondary | ICD-10-CM | POA: Insufficient documentation

## 2016-01-24 DIAGNOSIS — E039 Hypothyroidism, unspecified: Secondary | ICD-10-CM | POA: Insufficient documentation

## 2016-01-24 DIAGNOSIS — M1990 Unspecified osteoarthritis, unspecified site: Secondary | ICD-10-CM | POA: Insufficient documentation

## 2016-01-24 DIAGNOSIS — F419 Anxiety disorder, unspecified: Secondary | ICD-10-CM | POA: Insufficient documentation

## 2016-01-24 DIAGNOSIS — M797 Fibromyalgia: Secondary | ICD-10-CM | POA: Insufficient documentation

## 2016-01-24 DIAGNOSIS — R52 Pain, unspecified: Secondary | ICD-10-CM | POA: Insufficient documentation

## 2016-01-24 DIAGNOSIS — B009 Herpesviral infection, unspecified: Secondary | ICD-10-CM | POA: Insufficient documentation

## 2016-01-24 DIAGNOSIS — F32A Depression, unspecified: Secondary | ICD-10-CM | POA: Insufficient documentation

## 2016-01-24 HISTORY — PX: KNEE ARTHROSCOPY: SUR90

## 2016-02-05 ENCOUNTER — Encounter: Payer: Self-pay | Admitting: Obstetrics & Gynecology

## 2016-02-05 ENCOUNTER — Ambulatory Visit (INDEPENDENT_AMBULATORY_CARE_PROVIDER_SITE_OTHER): Payer: Medicare HMO | Admitting: Obstetrics & Gynecology

## 2016-02-05 VITALS — BP 110/58 | HR 72 | Resp 16 | Ht 60.0 in | Wt 110.8 lb

## 2016-02-05 DIAGNOSIS — Z205 Contact with and (suspected) exposure to viral hepatitis: Secondary | ICD-10-CM

## 2016-02-05 DIAGNOSIS — R87619 Unspecified abnormal cytological findings in specimens from cervix uteri: Secondary | ICD-10-CM | POA: Diagnosis not present

## 2016-02-05 DIAGNOSIS — Z01419 Encounter for gynecological examination (general) (routine) without abnormal findings: Secondary | ICD-10-CM | POA: Diagnosis not present

## 2016-02-05 LAB — POCT URINALYSIS DIPSTICK
Bilirubin, UA: NEGATIVE
Blood, UA: NEGATIVE
COLOR UA: NEGATIVE
Clarity, UA: NEGATIVE
Glucose, UA: NEGATIVE
KETONES UA: NEGATIVE
LEUKOCYTES UA: NEGATIVE
NITRITE UA: NEGATIVE
PH UA: 5
PROTEIN UA: NEGATIVE
UROBILINOGEN UA: NEGATIVE

## 2016-02-05 MED ORDER — ACYCLOVIR 400 MG PO TABS: 400.0000 mg | ORAL_TABLET | Freq: Three times a day (TID) | ORAL | Status: DC
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
## 2016-02-06 LAB — HEPATITIS C ANTIBODY: HCV Ab: NEGATIVE

## 2016-02-07 LAB — IPS PAP SMEAR ONLY

## 2016-03-19 ENCOUNTER — Ambulatory Visit: Payer: Commercial Managed Care - HMO | Admitting: Obstetrics & Gynecology

## 2016-09-13 ENCOUNTER — Other Ambulatory Visit: Payer: Self-pay | Admitting: Obstetrics & Gynecology

## 2016-09-13 DIAGNOSIS — Z1231 Encounter for screening mammogram for malignant neoplasm of breast: Secondary | ICD-10-CM

## 2016-10-28 ENCOUNTER — Ambulatory Visit (HOSPITAL_COMMUNITY)
Admission: RE | Admit: 2016-10-28 | Discharge: 2016-10-28 | Disposition: A | Discharge status: Home / Self Care | Payer: Medicare HMO | Source: Ambulatory Visit | Attending: Obstetrics & Gynecology | Admitting: Obstetrics & Gynecology

## 2016-10-28 DIAGNOSIS — Z1231 Encounter for screening mammogram for malignant neoplasm of breast: Secondary | ICD-10-CM

## 2016-11-27 DIAGNOSIS — J3089 Other allergic rhinitis: Secondary | ICD-10-CM | POA: Diagnosis not present

## 2016-12-18 ENCOUNTER — Encounter (HOSPITAL_COMMUNITY): Payer: Self-pay | Admitting: *Deleted

## 2016-12-25 DIAGNOSIS — M791 Myalgia: Secondary | ICD-10-CM | POA: Diagnosis not present

## 2016-12-25 DIAGNOSIS — M2550 Pain in unspecified joint: Secondary | ICD-10-CM | POA: Diagnosis not present

## 2016-12-25 DIAGNOSIS — M3500 Sicca syndrome, unspecified: Secondary | ICD-10-CM | POA: Diagnosis not present

## 2016-12-25 DIAGNOSIS — M797 Fibromyalgia: Secondary | ICD-10-CM | POA: Diagnosis not present

## 2017-01-01 ENCOUNTER — Encounter (HOSPITAL_COMMUNITY): Payer: Self-pay

## 2017-01-01 ENCOUNTER — Other Ambulatory Visit (HOSPITAL_COMMUNITY): Payer: Self-pay | Admitting: Emergency Medicine

## 2017-01-01 NOTE — Progress Notes (Signed)
EKG 12-10-16 chart  CXR 09-01-16 chart LOV 11-27-16 Ezzie Dural PA-C chart

## 2017-01-01 NOTE — Patient Instructions (Signed)
Jessica Ramsey  01/01/2017   Your procedure is scheduled on: 01-08-17  Report to [facility]  Entrance take [facility]  elevators to 3rd floor to  [place] at 630AM.  Call this number if you have problems the morning of surgery [phone]   Remember: ONLY 1 PERSON MAY GO WITH YOU TO SHORT STAY TO GET  READY MORNING OF [place].  Do not eat food or drink liquids :After Midnight.     Take these medicines the morning of surgery with A SIP OF WATER: TYLENOL, FLEXERIL, RESTASIS, DULOXETINE(CYMBALTA), FLONASE              You may not have any metal on your body including hair pins and              piercings  Do not wear jewelry, make-up, lotions, powders or perfumes, deodorant             Do not wear nail polish.  Do not shave  48 hours prior to surgery.              Men may shave face and neck.   Do not bring valuables to the hospital. [place].  Contacts, dentures or bridgework may not be worn into surgery.  Leave suitcase in the car. After surgery it may be brought to your room.               Please read over the following fact sheets you were given: _____________________________________________________________________             [facility] - Preparing for Surgery Before surgery, you can play an important role.  Because skin is not sterile, your skin needs to be as free of germs as possible.  You can reduce the number of germs on your skin by washing with CHG (chlorahexidine gluconate) soap before surgery.  CHG is an antiseptic cleaner which kills germs and bonds with the skin to continue killing germs even after washing. Please DO NOT use if you have an allergy to CHG or antibacterial soaps.  If your skin becomes reddened/irritated stop using the CHG and inform your nurse when you arrive at Short Stay. Do not shave (including legs and underarms) for at least 48 hours prior to the first CHG  shower.  You may shave your face/neck. Please follow these instructions carefully:  1.  Shower with CHG Soap the night before surgery and the  morning of Surgery.  2.  If you choose to wash your hair, wash your hair first as usual with your  normal  shampoo.  3.  After you shampoo, rinse your hair and body thoroughly to remove the  shampoo.                           4.  Use CHG as you would any other liquid soap.  You can apply chg directly  to the skin and wash                       Gently with a scrungie or clean washcloth.  5.  Apply the CHG Soap to your body ONLY FROM THE NECK DOWN.   Do not use on face/ open  Wound or open sores. Avoid contact with eyes, ears mouth and genitals (private parts).                       Wash face,  Genitals (private parts) with your normal soap.             6.  Wash thoroughly, paying special attention to the area where your surgery  will be performed.  7.  Thoroughly rinse your body with warm water from the neck down.  8.  DO NOT shower/wash with your normal soap after using and rinsing off  the CHG Soap.                9.  Pat yourself dry with a clean towel.            10.  Wear clean pajamas.            11.  Place clean sheets on your bed the night of your first shower and do not  sleep with pets. Day of Surgery : Do not apply any lotions/deodorants the morning of surgery.  Please wear clean clothes to the hospital/surgery center.  FAILURE TO FOLLOW THESE INSTRUCTIONS MAY RESULT IN THE CANCELLATION OF YOUR SURGERY PATIENT SIGNATURE_________________________________  NURSE SIGNATURE__________________________________  ________________________________________________________________________   Adam Phenix  An incentive spirometer is a tool that can help keep your lungs clear and active. This tool measures how well you are filling your lungs with each breath. Taking long deep breaths may help reverse or decrease the chance  of developing breathing (pulmonary) problems (especially infection) following:  A long period of time when you are unable to move or be active. BEFORE THE PROCEDURE   If the spirometer includes an indicator to show your best effort, your nurse or respiratory therapist will set it to a desired goal.  If possible, sit up straight or lean slightly forward. Try not to slouch.  Hold the incentive spirometer in an upright position. INSTRUCTIONS FOR USE  1. Sit on the edge of your bed if possible, or sit up as far as you can in bed or on a chair. 2. Hold the incentive spirometer in an upright position. 3. Breathe out normally. 4. Place the mouthpiece in your mouth and seal your lips tightly around it. 5. Breathe in slowly and as deeply as possible, raising the piston or the ball toward the top of the column. 6. Hold your breath for 3-5 seconds or for as long as possible. Allow the piston or ball to fall to the bottom of the column. 7. Remove the mouthpiece from your mouth and breathe out normally. 8. Rest for a few seconds and repeat Steps 1 through 7 at least 10 times every 1-2 hours when you are awake. Take your time and take a few normal breaths between deep breaths. 9. The spirometer may include an indicator to show your best effort. Use the indicator as a goal to work toward during each repetition. 10. After each set of 10 deep breaths, practice coughing to be sure your lungs are clear. If you have an incision (the cut made at the time of surgery), support your incision when coughing by placing a pillow or rolled up towels firmly against it. Once you are able to get out of bed, walk around indoors and cough well. You may stop using the incentive spirometer when instructed by your caregiver.  RISKS AND COMPLICATIONS  Take your time so you do not get  dizzy or light-headed.  If you are in pain, you may need to take or ask for pain medication before doing incentive spirometry. It is harder to  take a deep breath if you are having pain. AFTER USE  Rest and breathe slowly and easily.  It can be helpful to keep track of a log of your progress. Your caregiver can provide you with a simple table to help with this. If you are using the spirometer at home, follow these instructions: [place] IF:   You are having difficultly using the spirometer.  You have trouble using the spirometer as often as instructed.  Your pain medication is not giving enough relief while using the spirometer.  You develop fever of 100.5 F (38.1 C) or higher. SEEK IMMEDIATE MEDICAL CARE IF:   You cough up bloody sputum that had not been present before.  You develop fever of 102 F (38.9 C) or greater.  You develop worsening pain at or near the incision site. MAKE SURE YOU:   Understand these instructions.  Will watch your condition.  Will get help right away if you are not doing well or get worse. Document Released: 03/24/2007 Document Revised: 02/03/2012 Document Reviewed: 05/25/2007 ExitCare Patient Information 2014 ExitCare, [place].   ________________________________________________________________________  WHAT IS A BLOOD TRANSFUSION? Blood Transfusion Information  A transfusion is the replacement of blood or some of its parts. Blood is made up of multiple cells which provide different functions.  Red blood cells carry oxygen and are used for blood loss replacement.  White blood cells fight against infection.  Platelets control bleeding.  Plasma helps clot blood.  Other blood products are available for specialized needs, such as hemophilia or other clotting disorders. BEFORE THE TRANSFUSION  Who gives blood for transfusions?   Healthy volunteers who are fully evaluated to make sure their blood is safe. This is blood bank blood. Transfusion therapy is the safest it has ever been in the practice of medicine. Before blood is taken from a donor, a complete history is taken to  make sure that person has no history of diseases nor engages in risky social behavior (examples are intravenous drug use or sexual activity with multiple partners). The donor's travel history is screened to minimize risk of transmitting infections, such as malaria. The donated blood is tested for signs of infectious diseases, such as HIV and hepatitis. The blood is then tested to be sure it is compatible with you in order to minimize the chance of a transfusion reaction. If you or a relative donates blood, this is often done in anticipation of surgery and is not appropriate for emergency situations. It takes many days to process the donated blood. RISKS AND COMPLICATIONS Although transfusion therapy is very safe and saves many lives, the main dangers of transfusion include:   Getting an infectious disease.  Developing a transfusion reaction. This is an allergic reaction to something in the blood you were given. Every precaution is taken to prevent this. The decision to have a blood transfusion has been considered carefully by your caregiver before blood is given. Blood is not given unless the benefits outweigh the risks. AFTER THE TRANSFUSION  Right after receiving a blood transfusion, you will usually feel much better and more energetic. This is especially true if your red blood cells have gotten low (anemic). The transfusion raises the level of the red blood cells which carry oxygen, and this usually causes an energy increase.  The nurse administering the transfusion will  monitor you carefully for complications. HOME CARE INSTRUCTIONS  No special instructions are needed after a transfusion. You may find your energy is better. Speak with your caregiver about any limitations on activity for underlying diseases you may have. SEEK MEDICAL CARE IF:   Your condition is not improving after your transfusion.  You develop redness or irritation at the intravenous (IV) site. SEEK IMMEDIATE MEDICAL CARE  IF:  Any of the following symptoms occur over the next 12 hours:  Shaking chills.  You have a temperature by mouth above 102 F (38.9 C), not controlled by medicine.  Chest, back, or muscle pain.  People around you feel you are not acting correctly or are confused.  Shortness of breath or difficulty breathing.  Dizziness and fainting.  You get a rash or develop hives.  You have a decrease in urine output.  Your urine turns a dark color or changes to pink, red, or brown. Any of the following symptoms occur over the next 10 days:  You have a temperature by mouth above 102 F (38.9 C), not controlled by medicine.  Shortness of breath.  Weakness after normal activity.  The white part of the eye turns yellow (jaundice).  You have a decrease in the amount of urine or are urinating less often.  Your urine turns a dark color or changes to pink, red, or brown. Document Released: 11/08/2000 Document Revised: 02/03/2012 Document Reviewed: 06/27/2008 [facility] Patient Information 2014 [place], [place].  _______________________________________________________________________
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
## 2017-01-03 ENCOUNTER — Encounter (HOSPITAL_COMMUNITY): Payer: Self-pay

## 2017-01-03 ENCOUNTER — Encounter (HOSPITAL_COMMUNITY)
Admission: RE | Admit: 2017-01-03 | Discharge: 2017-01-03 | Disposition: A | Discharge status: Home / Self Care | Payer: PPO | Source: Ambulatory Visit | Attending: Orthopedic Surgery | Admitting: Orthopedic Surgery

## 2017-01-03 DIAGNOSIS — M1712 Unilateral primary osteoarthritis, left knee: Secondary | ICD-10-CM | POA: Diagnosis not present

## 2017-01-03 DIAGNOSIS — Z01818 Encounter for other preprocedural examination: Secondary | ICD-10-CM | POA: Diagnosis not present

## 2017-01-03 HISTORY — DX: Sciatica, left side: M54.32

## 2017-01-03 HISTORY — DX: Gastro-esophageal reflux disease without esophagitis: K21.9

## 2017-01-03 HISTORY — DX: Essential (primary) hypertension: I10

## 2017-01-03 HISTORY — DX: Unspecified osteoarthritis, unspecified site: M19.90

## 2017-01-03 HISTORY — DX: Anxiety disorder, unspecified: F41.9

## 2017-01-03 HISTORY — DX: Hypothyroidism, unspecified: E03.9

## 2017-01-03 HISTORY — DX: Carpal tunnel syndrome, bilateral upper limbs: G56.03

## 2017-01-03 LAB — COMPREHENSIVE METABOLIC PANEL
ALT: 21 U/L (ref 14–54)
AST: 21 U/L (ref 15–41)
Albumin: 4.2 g/dL (ref 3.5–5.0)
Alkaline Phosphatase: 58 U/L (ref 38–126)
Anion gap: 8 (ref 5–15)
BUN: 17 mg/dL (ref 6–20)
CO2: 29 mmol/L (ref 22–32)
Calcium: 9.7 mg/dL (ref 8.9–10.3)
Chloride: 103 mmol/L (ref 101–111)
Creatinine, Ser: 0.85 mg/dL (ref 0.44–1.00)
GFR calc Af Amer: >60 mL/min (ref >60)
GFR calc non Af Amer: >60 mL/min (ref >60)
Glucose, Bld: 92 mg/dL (ref 65–99)
Potassium: 4.2 mmol/L (ref 3.5–5.1)
Sodium: 140 mmol/L (ref 135–145)
Total Bilirubin: 0.6 mg/dL (ref 0.3–1.2)
Total Protein: 7.8 g/dL (ref 6.5–8.1)

## 2017-01-03 LAB — CBC WITH DIFFERENTIAL/PLATELET
Basophils Absolute: 0 10*3/uL (ref 0.0–0.1)
Basophils Relative: 0 %
Eosinophils Absolute: 0.4 10*3/uL (ref 0.0–0.7)
Eosinophils Relative: 4 %
HCT: 40.1 % (ref 36.0–46.0)
Hemoglobin: 13.3 g/dL (ref 12.0–15.0)
Lymphocytes Relative: 32 %
Lymphs Abs: 3.5 10*3/uL (ref 0.7–4.0)
MCH: 31.2 pg (ref 26.0–34.0)
MCHC: 33.2 g/dL (ref 30.0–36.0)
MCV: 94.1 fL (ref 78.0–100.0)
Monocytes Absolute: 0.5 10*3/uL (ref 0.1–1.0)
Monocytes Relative: 4 %
Neutro Abs: 6.6 10*3/uL (ref 1.7–7.7)
Neutrophils Relative %: 60 %
Platelets: 264 10*3/uL (ref 150–400)
RBC: 4.26 MIL/uL (ref 3.87–5.11)
RDW: 13.1 % (ref 11.5–15.5)
WBC: 10.9 10*3/uL — ABNORMAL HIGH (ref 4.0–10.5)

## 2017-01-03 LAB — URINALYSIS, ROUTINE W REFLEX MICROSCOPIC
Bilirubin Urine: NEGATIVE
Glucose, UA: NEGATIVE mg/dL
Hgb urine dipstick: NEGATIVE
Ketones, ur: NEGATIVE mg/dL
Leukocytes, UA: NEGATIVE
Nitrite: NEGATIVE
Protein, ur: NEGATIVE mg/dL
Specific Gravity, Urine: 1.009 (ref 1.005–1.030)
pH: 7 (ref 5.0–8.0)

## 2017-01-03 LAB — SURGICAL PCR SCREEN
MRSA, PCR: NEGATIVE
STAPHYLOCOCCUS AUREUS: NEGATIVE

## 2017-01-03 LAB — ABO/RH: ABO/RH(D): A POS

## 2017-01-03 LAB — PROTIME-INR
INR: 0.92
Prothrombin Time: 12.4 seconds (ref 11.4–15.2)

## 2017-01-03 LAB — APTT: aPTT: 29 seconds (ref 24–36)

## 2017-01-07 MED ORDER — BUPIVACAINE LIPOSOME 1.3 % IJ SUSP
20.0000 mL | Freq: Once | INTRAMUSCULAR | Status: DC
  Filled 2017-01-07: qty 20

## 2017-01-07 MED ORDER — TRANEXAMIC ACID 1000 MG/10ML IV SOLN
1000.0000 mg | INTRAVENOUS | Status: AC
  Administered 2017-01-08: 1000 mg via INTRAVENOUS
  Filled 2017-01-07: qty 1100

## 2017-01-08 ENCOUNTER — Encounter (HOSPITAL_COMMUNITY)
Admission: RE | Disposition: A | Discharge status: Home Health | Payer: Self-pay | Source: Ambulatory Visit | Attending: Orthopedic Surgery

## 2017-01-08 ENCOUNTER — Inpatient Hospital Stay (HOSPITAL_COMMUNITY)
Admission: RE | Admit: 2017-01-08 | Discharge: 2017-01-10 | DRG: 470 | Disposition: A | Discharge status: Home Health | Payer: PPO | Source: Ambulatory Visit | Attending: Orthopedic Surgery | Admitting: Orthopedic Surgery

## 2017-01-08 ENCOUNTER — Inpatient Hospital Stay (HOSPITAL_COMMUNITY): Payer: PPO | Admitting: Anesthesiology

## 2017-01-08 ENCOUNTER — Encounter (HOSPITAL_COMMUNITY): Payer: Self-pay | Admitting: *Deleted

## 2017-01-08 DIAGNOSIS — Z79899 Other long term (current) drug therapy: Secondary | ICD-10-CM

## 2017-01-08 DIAGNOSIS — M797 Fibromyalgia: Secondary | ICD-10-CM | POA: Diagnosis not present

## 2017-01-08 DIAGNOSIS — F419 Anxiety disorder, unspecified: Secondary | ICD-10-CM | POA: Diagnosis present

## 2017-01-08 DIAGNOSIS — M1712 Unilateral primary osteoarthritis, left knee: Principal | ICD-10-CM | POA: Diagnosis present

## 2017-01-08 DIAGNOSIS — F418 Other specified anxiety disorders: Secondary | ICD-10-CM | POA: Diagnosis not present

## 2017-01-08 DIAGNOSIS — E039 Hypothyroidism, unspecified: Secondary | ICD-10-CM | POA: Diagnosis not present

## 2017-01-08 DIAGNOSIS — F17210 Nicotine dependence, cigarettes, uncomplicated: Secondary | ICD-10-CM | POA: Diagnosis present

## 2017-01-08 DIAGNOSIS — Z880 Allergy status to penicillin: Secondary | ICD-10-CM | POA: Diagnosis not present

## 2017-01-08 DIAGNOSIS — Z881 Allergy status to other antibiotic agents status: Secondary | ICD-10-CM

## 2017-01-08 DIAGNOSIS — F329 Major depressive disorder, single episode, unspecified: Secondary | ICD-10-CM | POA: Diagnosis present

## 2017-01-08 DIAGNOSIS — Z888 Allergy status to other drugs, medicaments and biological substances status: Secondary | ICD-10-CM

## 2017-01-08 DIAGNOSIS — Z96652 Presence of left artificial knee joint: Secondary | ICD-10-CM

## 2017-01-08 DIAGNOSIS — Z885 Allergy status to narcotic agent status: Secondary | ICD-10-CM

## 2017-01-08 DIAGNOSIS — I10 Essential (primary) hypertension: Secondary | ICD-10-CM | POA: Diagnosis not present

## 2017-01-08 DIAGNOSIS — Z85828 Personal history of other malignant neoplasm of skin: Secondary | ICD-10-CM | POA: Diagnosis not present

## 2017-01-08 DIAGNOSIS — K219 Gastro-esophageal reflux disease without esophagitis: Secondary | ICD-10-CM | POA: Diagnosis present

## 2017-01-08 DIAGNOSIS — G8918 Other acute postprocedural pain: Secondary | ICD-10-CM | POA: Diagnosis not present

## 2017-01-08 HISTORY — PX: TOTAL KNEE ARTHROPLASTY: SHX125

## 2017-01-08 LAB — TYPE AND SCREEN
ABO/RH(D): A POS
Antibody Screen: NEGATIVE

## 2017-01-08 SURGERY — ARTHROPLASTY, KNEE, TOTAL
Anesthesia: Monitor Anesthesia Care | Site: Knee | Laterality: Left

## 2017-01-08 MED ORDER — POLYETHYLENE GLYCOL 3350 17 G PO PACK
17.0000 g | PACK | Freq: Every day | ORAL | Status: DC | PRN
  Administered 2017-01-09 (×2): 17 g via ORAL
  Filled 2017-01-08 (×2): qty 1

## 2017-01-08 MED ORDER — PROPOFOL 500 MG/50ML IV EMUL
INTRAVENOUS | Status: DC | PRN
  Administered 2017-01-08: 25 ug/kg/min via INTRAVENOUS

## 2017-01-08 MED ORDER — RIVAROXABAN 10 MG PO TABS
10.0000 mg | ORAL_TABLET | Freq: Every day | ORAL | Status: DC
  Administered 2017-01-09 – 2017-01-10 (×2): 10 mg via ORAL
  Filled 2017-01-08 (×2): qty 1

## 2017-01-08 MED ORDER — MIDAZOLAM HCL 2 MG/2ML IJ SOLN
INTRAMUSCULAR | Status: AC
  Filled 2017-01-08: qty 2

## 2017-01-08 MED ORDER — PROPOFOL 10 MG/ML IV BOLUS
INTRAVENOUS | Status: DC | PRN
  Administered 2017-01-08 (×2): 10 mg via INTRAVENOUS

## 2017-01-08 MED ORDER — HYDROMORPHONE HCL 2 MG PO TABS
2.0000 mg | ORAL_TABLET | ORAL | Status: DC | PRN
  Administered 2017-01-08 – 2017-01-09 (×7): 2 mg via ORAL
  Filled 2017-01-08 (×7): qty 1

## 2017-01-08 MED ORDER — DULOXETINE HCL 30 MG PO CPEP
30.0000 mg | ORAL_CAPSULE | Freq: Every day | ORAL | Status: DC
  Administered 2017-01-09 – 2017-01-10 (×2): 30 mg via ORAL
  Filled 2017-01-08 (×2): qty 1

## 2017-01-08 MED ORDER — DEXAMETHASONE SODIUM PHOSPHATE 4 MG/ML IJ SOLN
INTRAMUSCULAR | Status: DC | PRN
  Administered 2017-01-08: 5 mg via INTRAVENOUS

## 2017-01-08 MED ORDER — ONDANSETRON HCL 4 MG PO TABS: 4.0000 mg | ORAL_TABLET | Freq: Four times a day (QID) | ORAL | Status: DC | PRN

## 2017-01-08 MED ORDER — LISINOPRIL 20 MG PO TABS
20.0000 mg | ORAL_TABLET | Freq: Every day | ORAL | Status: DC
  Administered 2017-01-08 – 2017-01-09 (×2): 20 mg via ORAL
  Filled 2017-01-08 (×2): qty 1

## 2017-01-08 MED ORDER — ACETAMINOPHEN 325 MG PO TABS
650.0000 mg | ORAL_TABLET | Freq: Four times a day (QID) | ORAL | Status: DC | PRN
  Administered 2017-01-09 – 2017-01-10 (×4): 650 mg via ORAL
  Filled 2017-01-08 (×4): qty 2

## 2017-01-08 MED ORDER — BUPIVACAINE HCL (PF) 0.25 % IJ SOLN
INTRAMUSCULAR | Status: AC
  Filled 2017-01-08: qty 30

## 2017-01-08 MED ORDER — PHENYLEPHRINE HCL 10 MG/ML IJ SOLN
INTRAMUSCULAR | Status: AC
  Filled 2017-01-08: qty 1

## 2017-01-08 MED ORDER — CYCLOSPORINE 0.05 % OP EMUL
1.0000 [drp] | Freq: Two times a day (BID) | OPHTHALMIC | Status: DC
  Administered 2017-01-08 – 2017-01-10 (×4): 1 [drp] via OPHTHALMIC
  Filled 2017-01-08 (×4): qty 1

## 2017-01-08 MED ORDER — METHOCARBAMOL 500 MG PO TABS
500.0000 mg | ORAL_TABLET | Freq: Four times a day (QID) | ORAL | Status: DC | PRN
  Administered 2017-01-08 – 2017-01-10 (×6): 500 mg via ORAL
  Filled 2017-01-08 (×6): qty 1

## 2017-01-08 MED ORDER — FERROUS SULFATE 325 (65 FE) MG PO TABS
325.0000 mg | ORAL_TABLET | Freq: Three times a day (TID) | ORAL | Status: DC
  Administered 2017-01-08 – 2017-01-10 (×5): 325 mg via ORAL
  Filled 2017-01-08 (×5): qty 1

## 2017-01-08 MED ORDER — BUPIVACAINE LIPOSOME 1.3 % IJ SUSP
INTRAMUSCULAR | Status: DC | PRN
  Administered 2017-01-08: 20 mL

## 2017-01-08 MED ORDER — BUPIVACAINE IN DEXTROSE 0.75-8.25 % IT SOLN
INTRATHECAL | Status: DC | PRN
  Administered 2017-01-08: 2 mL via INTRATHECAL

## 2017-01-08 MED ORDER — LACTATED RINGERS IV SOLN
INTRAVENOUS | Status: DC
  Administered 2017-01-08 (×2): via INTRAVENOUS

## 2017-01-08 MED ORDER — VANCOMYCIN HCL IN DEXTROSE 1-5 GM/200ML-% IV SOLN
1000.0000 mg | INTRAVENOUS | Status: AC
  Administered 2017-01-08: 1000 mg via INTRAVENOUS
  Filled 2017-01-08: qty 200

## 2017-01-08 MED ORDER — AMITRIPTYLINE HCL 25 MG PO TABS
25.0000 mg | ORAL_TABLET | Freq: Every day | ORAL | Status: DC
  Administered 2017-01-08 – 2017-01-09 (×2): 25 mg via ORAL
  Filled 2017-01-08 (×2): qty 1

## 2017-01-08 MED ORDER — CHLORHEXIDINE GLUCONATE 4 % EX LIQD: 60.0000 mL | Freq: Once | CUTANEOUS | Status: DC

## 2017-01-08 MED ORDER — MENTHOL 3 MG MT LOZG: 1.0000 | LOZENGE | OROMUCOSAL | Status: DC | PRN

## 2017-01-08 MED ORDER — SODIUM CHLORIDE 0.9 % IJ SOLN
INTRAMUSCULAR | Status: DC | PRN
  Administered 2017-01-08: 20 mL

## 2017-01-08 MED ORDER — SODIUM CHLORIDE 0.9 % IJ SOLN
INTRAMUSCULAR | Status: AC
  Filled 2017-01-08: qty 50

## 2017-01-08 MED ORDER — NICOTINE 14 MG/24HR TD PT24
14.0000 mg | MEDICATED_PATCH | Freq: Every day | TRANSDERMAL | Status: DC
  Administered 2017-01-08 – 2017-01-09 (×2): 14 mg via TRANSDERMAL
  Filled 2017-01-08 (×3): qty 1

## 2017-01-08 MED ORDER — LIDOCAINE HCL (CARDIAC) 20 MG/ML IV SOLN
INTRAVENOUS | Status: DC | PRN
  Administered 2017-01-08: 60 mg via INTRAVENOUS

## 2017-01-08 MED ORDER — FENTANYL CITRATE (PF) 100 MCG/2ML IJ SOLN: 25.0000 ug | INTRAMUSCULAR | Status: DC | PRN

## 2017-01-08 MED ORDER — ONDANSETRON HCL 4 MG/2ML IJ SOLN
INTRAMUSCULAR | Status: DC | PRN
  Administered 2017-01-08: 4 mg via INTRAVENOUS

## 2017-01-08 MED ORDER — METHOCARBAMOL 1000 MG/10ML IJ SOLN
500.0000 mg | Freq: Four times a day (QID) | INTRAVENOUS | Status: DC | PRN
  Administered 2017-01-08: 13:00:00 500 mg via INTRAVENOUS
  Filled 2017-01-08: qty 5
  Filled 2017-01-08: qty 550

## 2017-01-08 MED ORDER — EPHEDRINE 5 MG/ML INJ
INTRAVENOUS | Status: AC
  Filled 2017-01-08: qty 10

## 2017-01-08 MED ORDER — ONDANSETRON HCL 4 MG/2ML IJ SOLN
INTRAMUSCULAR | Status: AC
  Filled 2017-01-08: qty 2

## 2017-01-08 MED ORDER — FENTANYL CITRATE (PF) 100 MCG/2ML IJ SOLN
INTRAMUSCULAR | Status: DC | PRN
  Administered 2017-01-08: 50 ug via INTRAVENOUS
  Administered 2017-01-08 (×2): 25 ug via INTRAVENOUS

## 2017-01-08 MED ORDER — ALUM & MAG HYDROXIDE-SIMETH 200-200-20 MG/5ML PO SUSP: 30.0000 mL | ORAL | Status: DC | PRN

## 2017-01-08 MED ORDER — MIDAZOLAM HCL 5 MG/5ML IJ SOLN
INTRAMUSCULAR | Status: DC | PRN
  Administered 2017-01-08: 1 mg via INTRAVENOUS
  Administered 2017-01-08 (×2): 0.5 mg via INTRAVENOUS
  Administered 2017-01-08: 1 mg via INTRAVENOUS
  Administered 2017-01-08 (×2): 0.5 mg via INTRAVENOUS

## 2017-01-08 MED ORDER — LACTATED RINGERS IV SOLN
INTRAVENOUS | Status: DC
  Administered 2017-01-08 – 2017-01-09 (×2): via INTRAVENOUS

## 2017-01-08 MED ORDER — EPHEDRINE SULFATE 50 MG/ML IJ SOLN
INTRAMUSCULAR | Status: DC | PRN
  Administered 2017-01-08 (×4): 10 mg via INTRAVENOUS

## 2017-01-08 MED ORDER — DEXAMETHASONE SODIUM PHOSPHATE 10 MG/ML IJ SOLN
INTRAMUSCULAR | Status: AC
  Filled 2017-01-08: qty 1

## 2017-01-08 MED ORDER — FLUTICASONE PROPIONATE 50 MCG/ACT NA SUSP
1.0000 | Freq: Every day | NASAL | Status: DC | PRN
  Administered 2017-01-08: 18:00:00 1 via NASAL
  Filled 2017-01-08 (×2): qty 16

## 2017-01-08 MED ORDER — SODIUM CHLORIDE 0.9 % IR SOLN
Status: DC | PRN
  Administered 2017-01-08: 500 mL

## 2017-01-08 MED ORDER — LIDOCAINE 2% (20 MG/ML) 5 ML SYRINGE
INTRAMUSCULAR | Status: AC
  Filled 2017-01-08: qty 5

## 2017-01-08 MED ORDER — BISACODYL 5 MG PO TBEC: 5.0000 mg | DELAYED_RELEASE_TABLET | Freq: Every day | ORAL | Status: DC | PRN

## 2017-01-08 MED ORDER — FLEET ENEMA 7-19 GM/118ML RE ENEM: 1.0000 | ENEMA | Freq: Once | RECTAL | Status: DC | PRN

## 2017-01-08 MED ORDER — DOCUSATE SODIUM 100 MG PO CAPS
100.0000 mg | ORAL_CAPSULE | Freq: Two times a day (BID) | ORAL | Status: DC
  Administered 2017-01-08 – 2017-01-10 (×4): 100 mg via ORAL
  Filled 2017-01-08 (×5): qty 1

## 2017-01-08 MED ORDER — PROPOFOL 10 MG/ML IV BOLUS
INTRAVENOUS | Status: AC
  Filled 2017-01-08: qty 40

## 2017-01-08 MED ORDER — BUPIVACAINE LIPOSOME 1.3 % IJ SUSP
INTRAMUSCULAR | Status: AC
  Filled 2017-01-08: qty 20

## 2017-01-08 MED ORDER — ONDANSETRON HCL 4 MG/2ML IJ SOLN
4.0000 mg | Freq: Four times a day (QID) | INTRAMUSCULAR | Status: DC | PRN
  Administered 2017-01-09: 05:00:00 4 mg via INTRAVENOUS
  Filled 2017-01-08: qty 2

## 2017-01-08 MED ORDER — FENTANYL CITRATE (PF) 100 MCG/2ML IJ SOLN
INTRAMUSCULAR | Status: AC
  Filled 2017-01-08: qty 2

## 2017-01-08 MED ORDER — HYDROMORPHONE HCL 1 MG/ML IJ SOLN
1.0000 mg | INTRAMUSCULAR | Status: DC | PRN
  Administered 2017-01-09: 15:00:00 0.5 mg via INTRAVENOUS
  Filled 2017-01-08: qty 1

## 2017-01-08 MED ORDER — ACETAMINOPHEN 650 MG RE SUPP: 650.0000 mg | Freq: Four times a day (QID) | RECTAL | Status: DC | PRN

## 2017-01-08 MED ORDER — PHENYLEPHRINE HCL 10 MG/ML IJ SOLN
INTRAMUSCULAR | Status: DC | PRN
  Administered 2017-01-08 (×2): 100 ug via INTRAVENOUS

## 2017-01-08 MED ORDER — VANCOMYCIN HCL IN DEXTROSE 1-5 GM/200ML-% IV SOLN
1000.0000 mg | Freq: Two times a day (BID) | INTRAVENOUS | Status: AC
  Administered 2017-01-08: 20:00:00 1000 mg via INTRAVENOUS
  Filled 2017-01-08: qty 200

## 2017-01-08 MED ORDER — PHENOL 1.4 % MT LIQD
1.0000 | OROMUCOSAL | Status: DC | PRN
  Filled 2017-01-08: qty 177

## 2017-01-08 MED ORDER — SODIUM CHLORIDE 0.9 % IR SOLN
Status: AC
  Filled 2017-01-08 (×2): qty 500000

## 2017-01-08 MED ORDER — GLYCOPYRROLATE 0.2 MG/ML IJ SOLN: INTRAMUSCULAR | Status: DC | PRN

## 2017-01-08 MED ORDER — ONDANSETRON HCL 4 MG/2ML IJ SOLN: 4.0000 mg | Freq: Once | INTRAMUSCULAR | Status: DC | PRN

## 2017-01-08 MED ORDER — MONTELUKAST SODIUM 10 MG PO TABS
10.0000 mg | ORAL_TABLET | Freq: Every day | ORAL | Status: DC
Start: 2017-01-09 — End: 2017-01-10
  Administered 2017-01-09 – 2017-01-10 (×2): 10 mg via ORAL
  Filled 2017-01-08 (×2): qty 1

## 2017-01-08 SURGICAL SUPPLY — 63 items
BAG DECANTER FOR FLEXI CONT (MISCELLANEOUS) IMPLANT
BAG ZIPLOCK 12X15 (MISCELLANEOUS) IMPLANT
BANDAGE ACE 4X5 VEL STRL LF (GAUZE/BANDAGES/DRESSINGS) ×3 IMPLANT
BANDAGE ACE 6X5 VEL STRL LF (GAUZE/BANDAGES/DRESSINGS) ×3 IMPLANT
BLADE SAG 18X100X1.27 (BLADE) ×3 IMPLANT
BLADE SAW SGTL 11.0X1.19X90.0M (BLADE) ×3 IMPLANT
BNDG COHESIVE 4X5 TAN NS LF (GAUZE/BANDAGES/DRESSINGS) ×9 IMPLANT
BONE CEMENT GENTAMICIN (Cement) ×6 IMPLANT
CAP KNEE TOTAL 3 SIGMA ×3 IMPLANT
CEMENT BONE GENTAMICIN 40 (Cement) ×2 IMPLANT
CLOSURE WOUND 1/2 X4 (GAUZE/BANDAGES/DRESSINGS) ×2
CLOTH BEACON ORANGE TIMEOUT ST (SAFETY) ×3 IMPLANT
CUFF TOURN SGL QUICK 34 (TOURNIQUET CUFF) ×2
CUFF TRNQT CYL 34X4X40X1 (TOURNIQUET CUFF) ×1 IMPLANT
DECANTER SPIKE VIAL GLASS SM (MISCELLANEOUS) ×3 IMPLANT
DRAPE INCISE IOBAN 66X45 STRL (DRAPES) IMPLANT
DRAPE U-SHAPE 47X51 STRL (DRAPES) ×3 IMPLANT
DRSG ADAPTIC 3X8 NADH LF (GAUZE/BANDAGES/DRESSINGS) ×3 IMPLANT
DRSG PAD ABDOMINAL 8X10 ST (GAUZE/BANDAGES/DRESSINGS) ×3 IMPLANT
DURAPREP 26ML APPLICATOR (WOUND CARE) ×3 IMPLANT
ELECT REM PT RETURN 9FT ADLT (ELECTROSURGICAL) ×3
ELECTRODE REM PT RTRN 9FT ADLT (ELECTROSURGICAL) ×1 IMPLANT
EVACUATOR 1/8 PVC DRAIN (DRAIN) ×3 IMPLANT
FACESHIELD WRAPAROUND (MASK) ×12 IMPLANT
GAUZE SPONGE 4X4 12PLY STRL (GAUZE/BANDAGES/DRESSINGS) ×3 IMPLANT
GLOVE BIOGEL PI IND STRL 6.5 (GLOVE) ×1 IMPLANT
GLOVE BIOGEL PI IND STRL 8.5 (GLOVE) ×1 IMPLANT
GLOVE BIOGEL PI INDICATOR 6.5 (GLOVE) ×2
GLOVE BIOGEL PI INDICATOR 8.5 (GLOVE) ×2
GLOVE ECLIPSE 8.0 STRL XLNG CF (GLOVE) ×6 IMPLANT
GLOVE SURG SS PI 6.5 STRL IVOR (GLOVE) ×3 IMPLANT
GOWN STRL REUS W/TWL LRG LVL3 (GOWN DISPOSABLE) ×3 IMPLANT
GOWN STRL REUS W/TWL XL LVL3 (GOWN DISPOSABLE) ×3 IMPLANT
HANDPIECE INTERPULSE COAX TIP (DISPOSABLE) ×2
HEMOSTAT SPONGE AVITENE ULTRA (HEMOSTASIS) ×3 IMPLANT
IMMOBILIZER KNEE 20 (SOFTGOODS) ×3
IMMOBILIZER KNEE 20 THIGH 36 (SOFTGOODS) ×1 IMPLANT
MANIFOLD NEPTUNE II (INSTRUMENTS) ×3 IMPLANT
NEEDLE HYPO 21X1.5 SAFETY (NEEDLE) ×3 IMPLANT
NEEDLE HYPO 22GX1.5 SAFETY (NEEDLE) ×3 IMPLANT
NS IRRIG 1000ML POUR BTL (IV SOLUTION) IMPLANT
PACK TOTAL KNEE CUSTOM (KITS) ×3 IMPLANT
PADDING CAST COTTON 6X4 STRL (CAST SUPPLIES) ×3 IMPLANT
PENCIL SMOKE EVAC W/HOLSTER (ELECTROSURGICAL) ×3 IMPLANT
POSITIONER SURGICAL ARM (MISCELLANEOUS) ×3 IMPLANT
SET HNDPC FAN SPRY TIP SCT (DISPOSABLE) ×1 IMPLANT
SET PAD KNEE POSITIONER (MISCELLANEOUS) ×3 IMPLANT
SPONGE LAP 18X18 X RAY DECT (DISPOSABLE) IMPLANT
STRIP CLOSURE SKIN 1/2X4 (GAUZE/BANDAGES/DRESSINGS) ×4 IMPLANT
SUT BONE WAX W31G (SUTURE) IMPLANT
SUT MNCRL AB 4-0 PS2 18 (SUTURE) ×3 IMPLANT
SUT VIC AB 1 CT1 27 (SUTURE) ×6
SUT VIC AB 1 CT1 27XBRD ANTBC (SUTURE) ×3 IMPLANT
SUT VIC AB 2-0 CT1 27 (SUTURE) ×6
SUT VIC AB 2-0 CT1 TAPERPNT 27 (SUTURE) ×3 IMPLANT
SUT VLOC 180 0 24IN GS25 (SUTURE) ×3 IMPLANT
SYR 20CC LL (SYRINGE) ×6 IMPLANT
TOWER CARTRIDGE SMART MIX (DISPOSABLE) ×3 IMPLANT
TRAY FOLEY CATH 14FRSI W/METER (CATHETERS) ×3 IMPLANT
TRAY FOLEY W/METER SILVER 16FR (SET/KITS/TRAYS/PACK) IMPLANT
WATER STERILE IRR 1500ML POUR (IV SOLUTION) ×3 IMPLANT
WRAP KNEE MAXI GEL POST OP (GAUZE/BANDAGES/DRESSINGS) ×3 IMPLANT
YANKAUER SUCT BULB TIP 10FT TU (MISCELLANEOUS) ×3 IMPLANT

## 2017-01-08 NOTE — Anesthesia Postprocedure Evaluation (Signed)
Anesthesia Post Note  Patient: Jessica Ramsey  Procedure(s) Performed: Procedure(s) (LRB): LEFT TOTAL KNEE ARTHROPLASTY (Left)  Patient location during evaluation: PACU Anesthesia Type: Spinal Level of consciousness: oriented and awake and alert Pain management: pain level controlled Vital Signs Assessment: post-procedure vital signs reviewed and stable Respiratory status: spontaneous breathing, respiratory function stable and patient connected to nasal cannula oxygen Cardiovascular status: blood pressure returned to baseline and stable Postop Assessment: no headache, no backache, spinal receding, no signs of nausea or vomiting, patient able to bend at knees and adequate PO intake Anesthetic complications: no       Last Vitals:  Vitals:   01/08/17 1500 01/08/17 1843  BP: (!) 118/56 (!) 122/56  Pulse: 98 (!) 101  Resp: 14 15  Temp: 36.8 C 36.5 C    Last Pain:  Vitals:   01/08/17 2010  TempSrc:   PainSc: [place]

## 2017-01-08 NOTE — Progress Notes (Signed)
Patient stood at bedside and walked to the sink with SBA. No complaints of dizziness and she tolerated activity.

## 2017-01-08 NOTE — Op Note (Signed)
NAMEPRANAYA, Jessica Ramsey              ACCOUNT NO.:  [id number]  MEDICAL RECORD NO.:  [id number]  LOCATION:                                 FACILITY:  PHYSICIAN:  Kipp Brood. Nicki Furlan, M.D.DATE OF BIRTH:  17-Jul-1956  DATE OF PROCEDURE:  01/08/2017 DATE OF DISCHARGE:                              OPERATIVE REPORT   SURGEON:  Kipp Brood. Gladstone Lighter, M.D.  OPERATIVE ASSISTANT:  Ardeen Jourdain, PA.  PREOPERATIVE DIAGNOSIS:  Bone-on-bone primary osteoarthritis of the left knee.  POSTOPERATIVE DIAGNOSIS:  Bone-on-bone primary osteoarthritis of the left knee.  OPERATION:  Left total knee arthroplasty utilizing the DePuy system.  We cemented all 3 components.  Gentamicin was used in the cement.  The sizes used were as follows:  We had a left size 2 posterior cruciate sacrificing type femoral component.  The tibial tray was a size 2.  The insert was a size 2, 10 mm thickness rotating platform polyethylene liner.  The patella was a size 35 with 3 pegs.  DESCRIPTION OF PROCEDURE:  Under spinal anesthesia, routine orthopedic prep and draping of the left lower extremity was carried out.  Prior to surgery, she had an adductor block in the left leg.  Also, I did the appropriate time-out and also marked the appropriate left leg in the holding area before the block.  At that particular time we exsanguinated the leg with an Esmarch after sterile prep and drape was carried out. We then elevated the tourniquet to 325 mmHg.  She had 1 g of vancomycin. The leg then was placed in a DeMayo knee holder and flexed.  Anterior approach to the knee was carried out.  I carried out a median parapatellar incision and reflected patella laterally.  I did lateral medial meniscectomies and excised the anterior and posterior cruciate ligaments.  We then made our initial drill hole in the intercondylar notch.  The guide rod was inserted up the canal.  We removed that and thoroughly irrigated out the canal.  We then removed  11 mm thickness off the distal femur at this time.  Following that we then measured the femur to be a size 2.  We did our anterior and posterior chamfering cuts for a size 2 left femoral component.  We then directed attention to the tibia in the usual fashion.  We removed approximately 6 mm thickness off the tibia.  We had a nice even cut.  We then inserted our lamina spreaders and continued to debride the menisci.  There were no spurs present on the femoral condyle.  We then measured the femur gap to be about a size 10.  Following that, we then continued to cut our keel cut out of the proximal tibia metaphyseal region.  We then did our notch cut out distal femur.  We inserted our trial components and had an excellent fit with a size 10 mm thickness insert.  We then did a resurfacing procedure on the patella for a size 35 patella.  Three drill holes were made in the articular surface of the patella.  We then thoroughly removed all the components, water picked the knee, dried the knee out, cemented all 3 components and simultaneously with  gentamicin and cement. After the cement was hardened, we made sure we removed all loose pieces of cement.  We irrigated out the knee.  We then inspected the knee, made sure there were no other loose pieces of cement.  We then inserted our permanent 10 mm thickness size 2 rotating platform, reduced the knee and had a nice reduction and good stability, and good flexion and extension.  We then irrigated the knee out again with antibiotic solution and then inserted a Hemovac drain and closed in layers over the Hemovac drain.  Sterile dressings were applied.          ______________________________ Kipp Brood Gladstone Lighter, M.D.     RAG/MEDQ  D:  01/08/2017  T:  01/08/2017  Job:  [id number]

## 2017-01-08 NOTE — H&P (Addendum)
CSW consulted for SNF placement. PN reviewed. H&P indicates pt is planning to return home with [facility] services. CSW will review PT recommendations, once available,  and assist with SNF if needed.  Werner Lean LCSW [phone]

## 2017-01-08 NOTE — Transfer of Care (Signed)
Immediate Anesthesia Transfer of Care Note  Patient: Jessica Ramsey  Procedure(s) Performed: Procedure(s) (LRB): LEFT TOTAL KNEE ARTHROPLASTY (Left)  Patient Location: PACU  Anesthesia Type: MAC / SAB  Level of Consciousness: awake, sedated, patient cooperative and responds to stimulation  Airway & Oxygen Therapy: Patient Spontanous Breathing and Patient connected to face mask oxygen  Post-op Assessment: Report given to PACU RN, Post -op Vital signs reviewed and stable and Patient moving all extremities  Post vital signs: Reviewed and stable  Complications: No apparent anesthesia complications

## 2017-01-08 NOTE — Interval H&P Note (Signed)
History and Physical Interval Note:  01/08/2017 8:20 AM  Jessica Ramsey  has presented today for surgery, with the diagnosis of Left knee osteoarthritis   The various methods of treatment have been discussed with the patient and family. After consideration of risks, benefits and other options for treatment, the patient has consented to  Procedure(s) with comments: LEFT TOTAL KNEE ARTHROPLASTY (Left) - requests 2hrs as a surgical intervention .  The patient's history has been reviewed, patient examined, no change in status, stable for surgery.  I have reviewed the patient's chart and labs.  Questions were answered to the patient's satisfaction.     Himani Corona A

## 2017-01-08 NOTE — Transfer of Care (Deleted)
Immediate Anesthesia Transfer of Care Note  Patient: Jessica Ramsey  Procedure(s) Performed: Procedure(s) (LRB): LEFT TOTAL KNEE ARTHROPLASTY (Left)  Patient Location: PACU  Anesthesia Type: General  Level of Consciousness: awake, sedated, patient cooperative and responds to stimulation  Airway & Oxygen Therapy: Patient Spontanous Breathing and Patient connected to face mask oxygen  Post-op Assessment: Report given to PACU RN, Post -op Vital signs reviewed and stable and Patient moving all extremities  Post vital signs: Reviewed and stable  Complications: No apparent anesthesia complications

## 2017-01-08 NOTE — Anesthesia Procedure Notes (Signed)
Procedure Name: MAC Date/Time: 01/08/2017 8:39 AM Performed by: Justice Rocher Pre-anesthesia Checklist: Patient identified, Emergency Drugs available, Suction available, Patient being monitored and Timeout performed Patient Re-evaluated:Patient Re-evaluated prior to inductionOxygen Delivery Method: Simple face mask Preoxygenation: Pre-oxygenation with 100% oxygen Placement Confirmation: positive ETCO2 and breath sounds checked- equal and bilateral

## 2017-01-08 NOTE — Anesthesia Procedure Notes (Signed)
Spinal  Patient location during procedure: OR Start time: 01/08/2017 8:38 AM End time: 01/08/2017 8:40 AM Staffing Anesthesiologist: Catalina Gravel Performed: anesthesiologist  Preanesthetic Checklist Completed: patient identified, surgical consent, pre-op evaluation, timeout performed, IV checked, risks and benefits discussed and monitors and equipment checked Spinal Block Patient position: sitting Prep: site prepped and draped and DuraPrep Patient monitoring: continuous pulse ox and blood pressure Approach: midline Location: L3-4 Needle Needle type: Pencan  Assessment Sensory level: T8 Additional Notes Functioning IV was confirmed and monitors were applied. Sterile prep and drape, including hand hygiene, mask and sterile gloves were used. The patient was positioned and the spine was prepped. The skin was anesthetized with lidocaine.  Free flow of clear CSF was obtained prior to injecting local anesthetic into the CSF.  The spinal needle aspirated freely following injection.  The needle was carefully withdrawn.  The patient tolerated the procedure well. Consent was obtained prior to procedure with all questions answered and concerns addressed. Risks including but not limited to bleeding, infection, nerve damage, paralysis, failed block, inadequate analgesia, allergic reaction, high spinal, itching and headache were discussed and the patient wished to proceed.   Hoy Morn, MD

## 2017-01-08 NOTE — Anesthesia Procedure Notes (Signed)
Procedure Name: MAC Date/Time: 01/08/2017 8:20 AM Performed by: Justice Rocher Pre-anesthesia Checklist: Patient identified, Emergency Drugs available, Suction available, Patient being monitored and Timeout performed Patient Re-evaluated:Patient Re-evaluated prior to inductionOxygen Delivery Method: Nasal cannula Preoxygenation: Pre-oxygenation with 100% oxygen Intubation Type: IV induction Placement Confirmation: breath sounds checked- equal and bilateral

## 2017-01-08 NOTE — Anesthesia Preprocedure Evaluation (Addendum)
Anesthesia Evaluation  Patient identified by MRN, date of birth, ID band Patient awake    Reviewed: Allergy & Precautions, NPO status , Patient's Chart, lab work & pertinent test results  Airway Mallampati: II  TM Distance: >3 FB Neck ROM: Full    Dental  (+) Teeth Intact, Dental Advisory Given, Missing, Partial Lower, Partial Upper, Chipped,    Pulmonary COPD,  COPD inhaler, Current Smoker,    Pulmonary exam normal breath sounds clear to auscultation       Cardiovascular hypertension, Pt. on medications (-) angina(-) CAD, (-) Past MI and (-) CHF Normal cardiovascular exam Rhythm:Regular Rate:Normal     Neuro/Psych PSYCHIATRIC DISORDERS Anxiety Depression CVA, No Residual Symptoms    GI/Hepatic Neg liver ROS, GERD  ,  Endo/Other  Hypothyroidism   Renal/GU negative Renal ROS     Musculoskeletal  (+) Arthritis , Osteoarthritis,  Fibromyalgia -  Abdominal   Peds  Hematology negative hematology ROS (+) Plt 264k   Anesthesia Other Findings Day of surgery medications reviewed with the patient.  Reproductive/Obstetrics                            Anesthesia Physical Anesthesia Plan  ASA: II  Anesthesia Plan: Spinal and MAC   Post-op Pain Management:  Regional for Post-op pain   Induction: Intravenous  Airway Management Planned: Simple Face Mask  Additional Equipment:   Intra-op Plan:   Post-operative Plan:   Informed Consent: I have reviewed the patients History and Physical, chart, labs and discussed the procedure including the risks, benefits and alternatives for the proposed anesthesia with the patient or authorized representative who has indicated his/her understanding and acceptance.   Dental advisory given  Plan Discussed with: CRNA, Anesthesiologist and Surgeon  Anesthesia Plan Comments: (Discussed risks and benefits of and differences between spinal and general. Discussed  risks of spinal including headache, backache, failure, bleeding, infection, and nerve damage. Patient consents to spinal. Questions answered. Coagulation studies and platelet count acceptable.)        Anesthesia Quick Evaluation

## 2017-01-08 NOTE — Anesthesia Procedure Notes (Signed)
Anesthesia Regional Block:  Adductor canal block  Pre-Anesthetic Checklist: ,, timeout performed, Correct Patient, Correct Site, Correct Laterality, Correct Procedure, Correct Position, site marked, Risks and benefits discussed,  Surgical consent,  Pre-op evaluation,  At surgeon's request and post-op pain management  Laterality: Left  Prep: chloraprep       Needles:  Injection technique: Single-shot  Needle Type: Echogenic Needle     Needle Length: 9cm 9 cm Needle Gauge: 21 and 21 G    Additional Needles:  Procedures: ultrasound guided (picture in chart) Adductor canal block Narrative:  Start time: 01/08/2017 8:18 AM End time: 01/08/2017 8:21 AM Injection made incrementally with aspirations every 5 mL.  Performed by: Personally  Anesthesiologist: Catalina Gravel  Additional Notes: No pain on injection. No increased resistance to injection. Injection made in 5cc increments.  Good needle visualization.  Patient tolerated procedure well.

## 2017-01-08 NOTE — Brief Op Note (Signed)
01/08/2017  10:05 AM  PATIENT:  Jessica Ramsey  61 y.o. female  PRE-OPERATIVE DIAGNOSIS:  Left knee Primary  osteoarthritis   POST-OPERATIVE DIAGNOSIS:  Left knee Primary  osteoarthritis   PROCEDURE:  Procedure(s) with comments: LEFT TOTAL KNEE ARTHROPLASTY (Left) - requests 2hrs  SURGEON:  Surgeon(s) and Role:    * Latanya Maudlin, MD - Primary  PHYSICIAN ASSISTANT: Ardeen Jourdain PA  ASSISTANTS: Ardeen Jourdain PA ANESTHESIA:   spinal and Adductor Block  EBL:  Total I/O In: 1300 [I.V.:1300] Out: -   BLOOD ADMINISTERED:none  DRAINS: (One) Hemovact drain(s) in the left Knee with  Suction Open   LOCAL MEDICATIONS USED:  OTHER 20cc of Exparel with 20cc of Normal Saline.  SPECIMEN:  No Specimen  DISPOSITION OF SPECIMEN:  N/A  COUNTS:  YES  TOURNIQUET:  * Missing tourniquet times found for documented tourniquets in log:  [id number] *  DICTATION: .Other Dictation: Dictation Number [id number]  PLAN OF CARE: Admit to inpatient   PATIENT DISPOSITION:  Stable in OR   Delay start of Pharmacological VTE agent (>24hrs) due to surgical blood loss or risk of bleeding: yes

## 2017-01-09 LAB — CBC
HCT: 28.2 % — ABNORMAL LOW (ref 36.0–46.0)
Hemoglobin: 9.6 g/dL — ABNORMAL LOW (ref 12.0–15.0)
MCH: 31.7 pg (ref 26.0–34.0)
MCHC: 34 g/dL (ref 30.0–36.0)
MCV: 93.1 fL (ref 78.0–100.0)
PLATELETS: 186 10*3/uL (ref 150–400)
RBC: 3.03 MIL/uL — ABNORMAL LOW (ref 3.87–5.11)
RDW: 12.7 % (ref 11.5–15.5)
WBC: 13.8 10*3/uL — AB (ref 4.0–10.5)

## 2017-01-09 LAB — BASIC METABOLIC PANEL
ANION GAP: 6 (ref 5–15)
BUN: 11 mg/dL (ref 6–20)
CO2: 27 mmol/L (ref 22–32)
Calcium: 8.6 mg/dL — ABNORMAL LOW (ref 8.9–10.3)
Chloride: 107 mmol/L (ref 101–111)
Creatinine, Ser: 0.73 mg/dL (ref 0.44–1.00)
GFR calc Af Amer: >60 mL/min (ref >60)
Glucose, Bld: 111 mg/dL — ABNORMAL HIGH (ref 65–99)
Potassium: 3.6 mmol/L (ref 3.5–5.1)
SODIUM: 140 mmol/L (ref 135–145)

## 2017-01-09 MED ORDER — HYDROMORPHONE HCL 2 MG PO TABS
2.0000 mg | ORAL_TABLET | ORAL | Status: DC | PRN
  Administered 2017-01-09 – 2017-01-10 (×5): 4 mg via ORAL
  Filled 2017-01-09 (×5): qty 2

## 2017-01-09 MED ORDER — METHOCARBAMOL 500 MG PO TABS: 500.0000 mg | ORAL_TABLET | Freq: Four times a day (QID) | ORAL | 1 refills | Status: DC | PRN

## 2017-01-09 NOTE — Progress Notes (Signed)
Subjective: 1 Day Post-Op Procedure(s) (LRB): LEFT TOTAL KNEE ARTHROPLASTY (Left) Patient reports pain as 3 on 0-10 scale.Doing very well. Hemovac DCd.    Objective: Vital signs in last 24 hours: Temp:  [97.5 F (36.4 C)-99.2 F (37.3 C)] 97.7 F (36.5 C) (02/15 0438) Pulse Rate:  [65-103] 65 (02/15 0438) Resp:  [9-17] 15 (02/15 0438) BP: (92-139)/(43-64) 108/56 (02/15 0438) SpO2:  [69 %-100 %] 96 % (02/15 0438)  Intake/Output from previous day: 02/14 0701 - 02/15 0700 In: 6006.6 [P.O.:2080; I.V.:3671.6; IV Piggyback:255] Out: 3025 [Urine:2975; Drains:30; Blood:20] Intake/Output this shift: No intake/output data recorded.   Recent Labs  01/09/17 0416  HGB 9.6*    Recent Labs  01/09/17 0416  WBC 13.8*  RBC 3.03*  HCT 28.2*  PLT 186    Recent Labs  01/09/17 0416  NA 140  K 3.6  CL 107  CO2 27  BUN 11  CREATININE 0.73  GLUCOSE 111*  CALCIUM 8.6*   No results for input(s): LABPT, INR in the last 72 hours.  Dorsiflexion/Plantar flexion intact No cellulitis present Compartment soft  Assessment/Plan: 1 Day Post-Op Procedure(s) (LRB): LEFT TOTAL KNEE ARTHROPLASTY (Left) Up with therapy  Jessica Ramsey A 01/09/2017, 7:23 AM

## 2017-01-09 NOTE — Evaluation (Signed)
Physical Therapy Evaluation Patient Details Name: Jessica Ramsey MRN: [id number] DOB: [birth year] Today's Date: 01/09/2017   History of Present Illness  Pt s/p L TKR and with hx of CVA and fibromyalgia  Clinical Impression  Pt s/p L TKR and presents with decreased L LE strength/ROM and post op pain limiting functional mobility.  Pt should progress to dc home with HHPT and initial 24/7 assist of family/friends.    Follow Up Recommendations Home health PT    Equipment Recommendations  None recommended by PT    Recommendations for Other Services OT consult     Precautions / Restrictions Precautions Precautions: Knee;Fall Required Braces or Orthoses: Knee Immobilizer - Left Knee Immobilizer - Left: Discontinue once straight leg raise with < 10 degree lag (Pt performed IND SLR this am) Restrictions Weight Bearing Restrictions: No Other Position/Activity Restrictions: WBAt      Mobility  Bed Mobility Overal bed mobility: Needs Assistance Bed Mobility: Supine to Sit     Supine to sit: Min assist     General bed mobility comments: cues for sequence and use of R LE to self assist  Transfers Overall transfer level: Needs assistance Equipment used: Rolling walker (2 wheeled) Transfers: Sit to/from Stand Sit to Stand: Min assist         General transfer comment: cues for LE management and use of UEs to self assist  Ambulation/Gait Ambulation/Gait assistance: Min assist Ambulation Distance (Feet): 50 Feet Assistive device: Rolling walker (2 wheeled) Gait Pattern/deviations: Step-to pattern;Decreased step length - right;Decreased step length - left;Shuffle;Trunk flexed Gait velocity: decr Gait velocity interpretation: Below normal speed for age/gender General Gait Details: cues for sequence, posture and position from [employer]            Wheelchair Mobility    Modified Rankin (Stroke Patients Only)       Balance Overall balance assessment: No apparent  balance deficits (not formally assessed)                                           Pertinent Vitals/Pain Pain Assessment: 0-10 Pain Score: 5  Pain Location: L knee Pain Descriptors / Indicators: Aching;Sore Pain Intervention(s): Limited activity within patient's tolerance;Monitored during session;Premedicated before session;Ice applied    Home Living Family/patient expects to be discharged to:: Private residence Living Arrangements: Alone Available Help at Discharge: Family;Friend(s) Type of Home: Apartment Home Access: Level entry;Elevator     Home Layout: One level Home Equipment: Walker - 4 wheels Additional Comments: Pt used stool into high bed    Prior Function Level of Independence: Independent               Hand Dominance        Extremity/Trunk Assessment   Upper Extremity Assessment Upper Extremity Assessment: Overall WFL for tasks assessed    Lower Extremity Assessment Lower Extremity Assessment: LLE deficits/detail LLE Deficits / Details: 3/5 quads with IND SLR and AAROM at knee -10-  40       Communication   Communication: No difficulties  Cognition Arousal/Alertness: Awake/alert Behavior During Therapy: WFL for tasks assessed/performed Overall Cognitive Status: Within Functional Limits for tasks assessed                      General Comments      Exercises Total Joint Exercises Ankle Circles/Pumps: AROM;Both;15 reps;Supine Quad Sets: AROM;Both;10 reps;Supine Heel  Slides: AAROM;Left;10 reps;Supine Straight Leg Raises: AAROM;AROM;Left;15 reps;Supine   Assessment/Plan    PT Assessment Patient needs continued PT services  PT Problem List Decreased strength;Decreased range of motion;Decreased activity tolerance;Decreased mobility;Decreased knowledge of use of DME;Pain          PT Treatment Interventions DME instruction;Gait training;Stair training;Functional mobility training;Therapeutic activities;Therapeutic  exercise;Patient/family education    PT Goals (Current goals can be found in the Care Plan section)  Acute Rehab PT Goals Patient Stated Goal: Regain IND PT Goal Formulation: With patient Time For Goal Achievement: 01/12/17 Potential to Achieve Goals: Good    Frequency 7X/week   Barriers to discharge        Co-evaluation               End of Session Equipment Utilized During Treatment: Gait belt Activity Tolerance: Patient tolerated treatment well Patient left: in chair;with call bell/phone within reach Nurse Communication: Mobility status         Time: 0820-0850 PT Time Calculation (min) (ACUTE ONLY): 30 min   Charges:   PT Evaluation $PT Eval Low Complexity: 1 Procedure PT Treatments $Therapeutic Exercise: 8-22 mins   PT G Codes:        Fidelis Loth 02-05-17, 9:34 AM

## 2017-01-09 NOTE — Evaluation (Signed)
Occupational Therapy Evaluation Patient Details Name: Jessica Ramsey MRN: [id number] DOB: Jan 15, 1956 Today's Date: 01/09/2017    History of Present Illness Pt s/p L TKR and with hx of CVA and fibromyalgia   Clinical Impression   Pt is s/p TKA resulting in the deficits listed below (see OT Problem List). Pt will benefit from skilled OT to increase their safety and independence with ADL and functional mobility for ADL to facilitate discharge to venue listed below.        Follow Up Recommendations  No OT follow up    Equipment Recommendations  None recommended by OT       Precautions / Restrictions Precautions Precautions: Knee;Fall Required Braces or Orthoses: Knee Immobilizer - Left Knee Immobilizer - Left: Discontinue once straight leg raise with < 10 degree lag (Pt performed IND SLR this am) Restrictions Weight Bearing Restrictions: No Other Position/Activity Restrictions: WBAt      Mobility Bed Mobility Overal bed mobility: Needs Assistance Bed Mobility: Sit to Supine     Supine to sit: Min assist Sit to supine: Min assist   General bed mobility comments: cues for sequence and use of R LE to self assist  Transfers Overall transfer level: Needs assistance Equipment used: Rolling walker (2 wheeled) Transfers: Sit to/from Stand Sit to Stand: Min assist         General transfer comment: cues for LE management and use of UEs to self assist    Balance Overall balance assessment: No apparent balance deficits (not formally assessed)                                          ADL Overall ADL's : Needs assistance/impaired Eating/Feeding: Set up;Sitting   Grooming: Set up;Sitting   Upper Body Bathing: Set up;Sitting   Lower Body Bathing: Moderate assistance;Sit to/from stand;Cueing for safety;Cueing for sequencing;Cueing for compensatory techniques   Upper Body Dressing : Set up;Sitting   Lower Body Dressing: Moderate assistance;Sit  to/from stand;Cueing for safety;Cueing for sequencing;Cueing for compensatory techniques   Toilet Transfer: Minimal assistance;RW;Ambulation;Cueing for sequencing;Cueing for safety   Toileting- Clothing Manipulation and Hygiene: Minimal assistance;Sit to/from stand;Cueing for safety;Cueing for sequencing         General ADL Comments: Pts pain increased and she wanted to go back to bed.                 Pertinent Vitals/Pain Pain Assessment: 0-10 Pain Score: 6  Pain Location: L knee Pain Descriptors / Indicators: Aching;Sore Pain Intervention(s): Monitored during session;Repositioned;Ice applied;Patient requesting pain meds-RN notified     Hand Dominance     Extremity/Trunk Assessment Upper Extremity Assessment Upper Extremity Assessment: Overall WFL for tasks assessed   Lower Extremity Assessment Lower Extremity Assessment: LLE deficits/detail LLE Deficits / Details: 3/5 quads with IND SLR and AAROM at knee -10-  40       Communication Communication Communication: No difficulties   Cognition Arousal/Alertness: Awake/alert Behavior During Therapy: WFL for tasks assessed/performed Overall Cognitive Status: Within Functional Limits for tasks assessed                                Home Living Family/patient expects to be discharged to:: Private residence Living Arrangements: Alone Available Help at Discharge: Family;Friend(s) Type of Home: Apartment Home Access: Level entry;Elevator     Home Layout:  One level               Home Equipment: Walker - 4 wheels   Additional Comments: Pt used stool into high bed      Prior Functioning/Environment Level of Independence: Independent                 OT Problem List: Decreased strength;Decreased activity tolerance;Pain   OT Treatment/Interventions: Self-care/ADL training;Patient/family education;DME and/or AE instruction    OT Goals(Current goals can be found in the care plan section) Acute  Rehab OT Goals Patient Stated Goal: Regain IND Time For Goal Achievement: 01/16/17 Potential to Achieve Goals: Good  OT Frequency: Min 2X/week   Barriers to D/C:               End of Session Equipment Utilized During Treatment: Rolling walker Nurse Communication: Mobility status  Activity Tolerance: Patient tolerated treatment well Patient left: in bed;with call bell/phone within reach   Time: 0945-1002 OT Time Calculation (min): 17 min Charges:  OT General Charges $OT Visit: 1 Procedure OT Evaluation $OT Eval Moderate Complexity: 1 Procedure G-Codes:    Payton Mccallum D 01-24-17, 11:36 AM

## 2017-01-09 NOTE — Progress Notes (Signed)
Physical Therapy Treatment Patient Details Name: Jessica Ramsey MRN: [id number] DOB: 08/06/1956 Today's Date: January 26, 2017    History of Present Illness Pt s/p L TKR and with hx of CVA and fibromyalgia    PT Comments    Pt initially agreeable to ambulate and requested using bathroom first.  After using bathroom, pt reported too much pain to tolerate hallway ambulation and requested back to bed.   Applied ice packs from freezer.  Pt reports RN checking with MD about adjusting pain meds.   Follow Up Recommendations  Home health PT     Equipment Recommendations  None recommended by PT    Recommendations for Other Services       Precautions / Restrictions Precautions Precautions: Knee;Fall Restrictions Other Position/Activity Restrictions: WBAT    Mobility  Bed Mobility Overal bed mobility: Needs Assistance Bed Mobility: Supine to Sit;Sit to Supine     Supine to sit: Min assist Sit to supine: Min assist   General bed mobility comments: assist for L LE due to increased pain  Transfers Overall transfer level: Needs assistance Equipment used: Rolling walker (2 wheeled) Transfers: Sit to/from Stand Sit to Stand: Min assist         General transfer comment: pt able to recall safe technique from previous session, assist to rise  Ambulation/Gait Ambulation/Gait assistance: Min guard Ambulation Distance (Feet): 15 Feet Assistive device: Rolling walker (2 wheeled) Gait Pattern/deviations: Step-to pattern;Decreased stance time - left;Antalgic Gait velocity: decr   General Gait Details: cues for sequence, posture and position from [employer]            Wheelchair Mobility    Modified Rankin (Stroke Patients Only)       Balance                                    Cognition Arousal/Alertness: Awake/alert Behavior During Therapy: WFL for tasks assessed/performed Overall Cognitive Status: Within Functional Limits for tasks assessed                       Exercises      General Comments        Pertinent Vitals/Pain Pain Assessment: 0-10 Pain Score: 10-Worst pain ever Pain Location: L knee Pain Descriptors / Indicators: Aching;Sore Pain Intervention(s): Limited activity within patient's tolerance;Premedicated before session;Monitored during session;Repositioned;Ice applied    Home Living                      Prior Function            PT Goals (current goals can now be found in the care plan section) Progress towards PT goals: Progressing toward goals    Frequency    7X/week      PT Plan Current plan remains appropriate    Co-evaluation             End of Session Equipment Utilized During Treatment: Gait belt Activity Tolerance: Patient limited by pain Patient left: in bed;with call bell/phone within reach     Time: 1447-1455 PT Time Calculation (min) (ACUTE ONLY): 8 min  Charges:  $Gait Training: 8-22 mins                    G Codes:      Marcella Charlson,KATHrine E Jan 26, 2017, 4:01 PM Carmelia Bake, PT, DPT 2017-01-26 Pager: [phone]

## 2017-01-09 NOTE — Care Management Note (Signed)
Case Management Note  Patient Details  Name: Jessica Ramsey MRN: [id number] Date of Birth: [birth year]  Subjective/Objective:                  LEFT TOTAL KNEE ARTHROPLASTY (Left) Action/Plan: Discharge planning Expected Discharge Date:  01/10/17               Expected Discharge Plan:  [place]  In-House Referral:     Discharge planning Services  CM Consult  Post Acute Care Choice:  Home Health Choice offered to:  Patient  DME Arranged:  N/A DME Agency:  NA  HH Arranged:  PT [place] Agency:  [facility] (formerly [facility])  Status of Service:  Completed, signed off  If discussed at [employer], dates discussed:    Additional Comments: CM met with pt in room to offer choice of home health agency. Pt chooses [facility] to render HHPT. Referral called to [facility] rep, Tim. Pt states she has all DME needed at home. NO other CM needs were communicated. Dellie Catholic, RN 01/09/2017, 1:36 PM

## 2017-01-10 LAB — BASIC METABOLIC PANEL
ANION GAP: 8 (ref 5–15)
BUN: 9 mg/dL (ref 6–20)
CALCIUM: 8.6 mg/dL — AB (ref 8.9–10.3)
CO2: 29 mmol/L (ref 22–32)
Chloride: 95 mmol/L — ABNORMAL LOW (ref 101–111)
Creatinine, Ser: 0.81 mg/dL (ref 0.44–1.00)
GLUCOSE: 145 mg/dL — AB (ref 65–99)
Potassium: 3.7 mmol/L (ref 3.5–5.1)
Sodium: 132 mmol/L — ABNORMAL LOW (ref 135–145)

## 2017-01-10 LAB — CBC
HEMATOCRIT: 30.8 % — AB (ref 36.0–46.0)
Hemoglobin: 10.4 g/dL — ABNORMAL LOW (ref 12.0–15.0)
MCH: 31.1 pg (ref 26.0–34.0)
MCHC: 33.8 g/dL (ref 30.0–36.0)
MCV: 92.2 fL (ref 78.0–100.0)
PLATELETS: 202 10*3/uL (ref 150–400)
RBC: 3.34 MIL/uL — ABNORMAL LOW (ref 3.87–5.11)
RDW: 12.8 % (ref 11.5–15.5)
WBC: 13.2 10*3/uL — AB (ref 4.0–10.5)

## 2017-01-10 MED ORDER — HYDROMORPHONE HCL 2 MG PO TABS: 2.0000 mg | ORAL_TABLET | ORAL | 0 refills | Status: DC | PRN

## 2017-01-10 MED ORDER — ASPIRIN EC 325 MG PO TBEC: 325.0000 mg | DELAYED_RELEASE_TABLET | Freq: Two times a day (BID) | ORAL | 0 refills | Status: AC

## 2017-01-10 NOTE — Progress Notes (Signed)
Subjective: 2 Days Post-Op Procedure(s) (LRB): LEFT TOTAL KNEE ARTHROPLASTY (Left) Patient reports pain as 3 on 0-10 scale. Doing very well. Wound looks fine.  Objective: Vital signs in last 24 hours: Temp:  [97.9 F (36.6 C)-99.6 F (37.6 C)] 99.6 F (37.6 C) (02/16 0845) Pulse Rate:  [65-102] 102 (02/16 0845) Resp:  [14-16] 15 (02/16 0845) BP: (104-161)/(51-75) 161/61 (02/16 0845) SpO2:  [99 %-100 %] 99 % (02/16 0845)  Intake/Output from previous day: 02/15 0701 - 02/16 0700 In: 2300 [P.O.:1300; I.V.:1000] Out: 675 [Urine:675] Intake/Output this shift: Total I/O In: 120 [P.O.:120] Out: 350 [Urine:350]   Recent Labs  01/09/17 0416 01/10/17 0414  HGB 9.6* 10.4*    Recent Labs  01/09/17 0416 01/10/17 0414  WBC 13.8* 13.2*  RBC 3.03* 3.34*  HCT 28.2* 30.8*  PLT 186 202    Recent Labs  01/09/17 0416 01/10/17 0414  NA 140 132*  K 3.6 3.7  CL 107 95*  CO2 27 29  BUN 11 9  CREATININE 0.73 0.81  GLUCOSE 111* 145*  CALCIUM 8.6* 8.6*   No results for input(s): LABPT, INR in the last 72 hours.  Neurologically intact Dorsiflexion/Plantar flexion intact No cellulitis present  Assessment/Plan: 2 Days Post-Op Procedure(s) (LRB): LEFT TOTAL KNEE ARTHROPLASTY (Left) Up with therapy Discharge home with home health  Raelle Chambers A 01/10/2017, 9:19 AM

## 2017-01-10 NOTE — Progress Notes (Signed)
Occupational Therapy Treatment Patient Details Name: Jessica Ramsey MRN: [id number] DOB: 1956-06-28 Today's Date: 01/10/2017    History of present illness Pt s/p L TKR and with hx of CVA and fibromyalgia   OT comments  Pt much improved this day  Follow Up Recommendations  No OT follow up    Equipment Recommendations  None recommended by OT       Precautions / Restrictions Precautions Precautions: Knee;Fall Restrictions Weight Bearing Restrictions: No Other Position/Activity Restrictions: WBAT       Mobility Bed Mobility               General bed mobility comments: pt in chair  Transfers Overall transfer level: Needs assistance Equipment used: Rolling walker (2 wheeled) Transfers: Sit to/from [employer] Transfers Sit to Stand: Supervision Stand pivot transfers: Supervision            Balance Overall balance assessment: No apparent balance deficits (not formally assessed)                                 ADL Overall ADL's : Needs assistance/impaired                 Upper Body Dressing : Set up;Sitting   Lower Body Dressing: Min guard;Sit to/from stand;Cueing for safety;Cueing for sequencing   Toilet Transfer: RW;Ambulation;Cueing for sequencing;Cueing for safety;Supervision/safety   Toileting- Clothing Manipulation and Hygiene: Sit to/from stand;Cueing for safety;Cueing for sequencing;Supervision/safety     Tub/Shower Transfer Details (indicate cue type and reason): educated pt on safe technique Functional mobility during ADLs: Supervision/safety General ADL Comments: pt much improved this day!                Cognition   Behavior During Therapy: WFL for tasks assessed/performed Overall Cognitive Status: Within Functional Limits for tasks assessed                       Extremity/Trunk Assessment                          Pertinent Vitals/ Pain       Pain Assessment: 0-10 Pain Score: 5  Pain  Location: L knee Pain Descriptors / Indicators: Aching;Sore Pain Intervention(s): Monitored during session         Frequency  Min 2X/week        Progress Toward Goals  OT Goals(current goals can now be found in the care plan section)  Progress towards OT goals: Progressing toward goals     Plan Discharge plan remains appropriate       End of Session Equipment Utilized During Treatment: Rolling walker   Activity Tolerance Patient tolerated treatment well   Patient Left in bed;with call bell/phone within reach   Nurse Communication Mobility status        Time: [id number] OT Time Calculation (min): 16 min  Charges: OT General Charges $OT Visit: 1 Procedure OT Treatments $Self Care/Home Management : 8-22 mins  Chip Canepa D 01/10/2017, 11:36 AM

## 2017-01-10 NOTE — Progress Notes (Signed)
Physical Therapy Treatment Patient Details Name: Jessica Ramsey MRN: [id number] DOB: 1956/06/07 Today's Date: 01/10/2017    History of Present Illness Pt s/p L TKR and with hx of CVA and fibromyalgia    PT Comments    POD # 2  Assisted with amb in hallway then to bathroom then performed all supine TKR TE's following HEP handout.  Instructed on proper tech and freq as well as use of ICE.   Follow Up Recommendations  Home health PT     Equipment Recommendations  None recommended by PT    Recommendations for Other Services       Precautions / Restrictions Precautions Precautions: Knee;Fall Precaution Comments: instructed on KI use  Required Braces or Orthoses: Knee Immobilizer - Left Knee Immobilizer - Left: Discontinue once straight leg raise with < 10 degree lag Restrictions Weight Bearing Restrictions: No Other Position/Activity Restrictions: WBAT    Mobility  Bed Mobility               General bed mobility comments: OOB in recllner  Transfers Overall transfer level: Needs assistance Equipment used: Rolling walker (2 wheeled) Transfers: Sit to/from Stand Sit to Stand: Modified independent (Device/Increase time) Stand pivot transfers: Supervision       General transfer comment: pt able to recall safe technique from previous session, assist to rise  Ambulation/Gait Ambulation/Gait assistance: Min guard;Supervision Ambulation Distance (Feet): 45 Feet Assistive device: Rolling walker (2 wheeled) Gait Pattern/deviations: Step-to pattern;Decreased stance time - left;Antalgic     General Gait Details: cues for sequence, posture and position from RW   Stairs            Wheelchair Mobility    Modified Rankin (Stroke Patients Only)       Balance Overall balance assessment: No apparent balance deficits (not formally assessed)                                  Cognition Arousal/Alertness: Awake/alert Behavior During Therapy: WFL  for tasks assessed/performed Overall Cognitive Status: Within Functional Limits for tasks assessed                      Exercises   Total Knee Replacement TE's 10 reps B LE ankle pumps 10 reps towel squeezes 10 reps knee presses 10 reps heel slides  10 reps SAQ's 10 reps SLR's 10 reps ABD Followed by ICE     General Comments        Pertinent Vitals/Pain Pain Assessment: 0-10 Pain Score: 6  Pain Location: L knee Pain Descriptors / Indicators: Aching;Sore Pain Intervention(s): Monitored during session;Repositioned;Ice applied    Home Living                      Prior Function            PT Goals (current goals can now be found in the care plan section) Progress towards PT goals: Progressing toward goals    Frequency    7X/week      PT Plan Current plan remains appropriate    Co-evaluation             End of Session Equipment Utilized During Treatment: Gait belt Activity Tolerance: Patient tolerated treatment well Patient left: with call bell/phone within reach;in chair     Time: [id number] PT Time Calculation (min) (ACUTE ONLY): 40 min  Charges:  $Gait Training: 8-22 mins $Therapeutic Exercise:  8-22 mins $Therapeutic Activity: 8-22 mins                    G Codes:      Rica Koyanagi  PTA [facility]  Acute  Rehab Pager      [phone]

## 2017-01-12 DIAGNOSIS — I10 Essential (primary) hypertension: Secondary | ICD-10-CM | POA: Diagnosis not present

## 2017-01-12 DIAGNOSIS — G549 Nerve root and plexus disorder, unspecified: Secondary | ICD-10-CM | POA: Diagnosis not present

## 2017-01-12 DIAGNOSIS — Z471 Aftercare following joint replacement surgery: Secondary | ICD-10-CM | POA: Diagnosis not present

## 2017-01-12 DIAGNOSIS — Z79891 Long term (current) use of opiate analgesic: Secondary | ICD-10-CM | POA: Diagnosis not present

## 2017-01-12 DIAGNOSIS — F17210 Nicotine dependence, cigarettes, uncomplicated: Secondary | ICD-10-CM | POA: Diagnosis not present

## 2017-01-12 DIAGNOSIS — F419 Anxiety disorder, unspecified: Secondary | ICD-10-CM | POA: Diagnosis not present

## 2017-01-12 DIAGNOSIS — Z96652 Presence of left artificial knee joint: Secondary | ICD-10-CM | POA: Diagnosis not present

## 2017-01-12 DIAGNOSIS — M797 Fibromyalgia: Secondary | ICD-10-CM | POA: Diagnosis not present

## 2017-01-12 DIAGNOSIS — F329 Major depressive disorder, single episode, unspecified: Secondary | ICD-10-CM | POA: Diagnosis not present

## 2017-01-12 DIAGNOSIS — Z7982 Long term (current) use of aspirin: Secondary | ICD-10-CM | POA: Diagnosis not present

## 2017-01-12 DIAGNOSIS — Z8673 Personal history of transient ischemic attack (TIA), and cerebral infarction without residual deficits: Secondary | ICD-10-CM | POA: Diagnosis not present
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
## 2017-01-20 DIAGNOSIS — Z471 Aftercare following joint replacement surgery: Secondary | ICD-10-CM | POA: Diagnosis not present

## 2017-01-20 DIAGNOSIS — I10 Essential (primary) hypertension: Secondary | ICD-10-CM | POA: Diagnosis not present

## 2017-01-20 DIAGNOSIS — Z8673 Personal history of transient ischemic attack (TIA), and cerebral infarction without residual deficits: Secondary | ICD-10-CM | POA: Diagnosis not present

## 2017-01-20 DIAGNOSIS — Z79891 Long term (current) use of opiate analgesic: Secondary | ICD-10-CM | POA: Diagnosis not present

## 2017-01-20 DIAGNOSIS — F329 Major depressive disorder, single episode, unspecified: Secondary | ICD-10-CM | POA: Diagnosis not present

## 2017-01-20 DIAGNOSIS — Z96652 Presence of left artificial knee joint: Secondary | ICD-10-CM | POA: Diagnosis not present

## 2017-01-20 DIAGNOSIS — M797 Fibromyalgia: Secondary | ICD-10-CM | POA: Diagnosis not present

## 2017-01-20 DIAGNOSIS — Z7982 Long term (current) use of aspirin: Secondary | ICD-10-CM | POA: Diagnosis not present

## 2017-01-20 DIAGNOSIS — F419 Anxiety disorder, unspecified: Secondary | ICD-10-CM | POA: Diagnosis not present

## 2017-01-20 DIAGNOSIS — G549 Nerve root and plexus disorder, unspecified: Secondary | ICD-10-CM | POA: Diagnosis not present

## 2017-01-20 DIAGNOSIS — F17210 Nicotine dependence, cigarettes, uncomplicated: Secondary | ICD-10-CM | POA: Diagnosis not present

## 2017-01-21 DIAGNOSIS — M1712 Unilateral primary osteoarthritis, left knee: Secondary | ICD-10-CM | POA: Diagnosis not present

## 2017-01-27 ENCOUNTER — Ambulatory Visit: Payer: PPO | Attending: Orthopedic Surgery | Admitting: Physical Therapy

## 2017-01-27 DIAGNOSIS — R600 Localized edema: Secondary | ICD-10-CM | POA: Insufficient documentation

## 2017-01-27 DIAGNOSIS — M25662 Stiffness of left knee, not elsewhere classified: Secondary | ICD-10-CM | POA: Diagnosis not present

## 2017-01-27 DIAGNOSIS — M25562 Pain in left knee: Secondary | ICD-10-CM | POA: Diagnosis not present
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
## 2017-01-29 ENCOUNTER — Ambulatory Visit: Payer: PPO | Admitting: Physical Therapy

## 2017-01-29 ENCOUNTER — Encounter: Payer: Self-pay | Admitting: Physical Therapy

## 2017-01-29 DIAGNOSIS — R600 Localized edema: Secondary | ICD-10-CM

## 2017-01-29 DIAGNOSIS — M25662 Stiffness of left knee, not elsewhere classified: Secondary | ICD-10-CM

## 2017-01-29 DIAGNOSIS — M25562 Pain in left knee: Secondary | ICD-10-CM | POA: Diagnosis not present
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
## 2017-01-29 NOTE — Patient Instructions (Addendum)
Strengthening: Straight Leg Raise (Phase 1)    Tighten muscles on front of left thigh, then lift leg _5___ inches from surface, keeping knee locked.  Repeat _10___ times per set. Do __2__ sets per session. Do __2-3__ sessions per day.  http://orth.exer.us/614   Copyright  VHI. All rights reserved.  Strengthening: Terminal Knee Extension (Supine)    With left knee over bolster, straighten knee by tightening muscles on top of thigh. Keep bottom of knee on bolster. Hold for 5 seconds. Repeat __10__ times per set. Do __2__ sets per session. Do _2-3___ sessions per day.  http://orth.exer.us/626   Copyright  VHI. All rights reserved.  Stretching: Hamstring (Standing)    Place left foot on stool. Slowly lean forward, keeping back straight, until stretch is felt in back of thigh. Hold __30__ seconds. Repeat _3___ times per set. Do ____ sets per session. Do __2-3__ sessions per day.  http://orth.exer.us/658   Copyright  VHI. All rights reserved.

## 2017-01-30 DIAGNOSIS — M545 Low back pain: Secondary | ICD-10-CM | POA: Diagnosis not present

## 2017-01-30 DIAGNOSIS — S129XXA Fracture of neck, unspecified, initial encounter: Secondary | ICD-10-CM | POA: Diagnosis not present

## 2017-02-05 ENCOUNTER — Ambulatory Visit: Payer: PPO | Admitting: Physical Therapy

## 2017-02-05 ENCOUNTER — Encounter: Payer: Self-pay | Admitting: Physical Therapy

## 2017-02-05 DIAGNOSIS — M25562 Pain in left knee: Secondary | ICD-10-CM | POA: Diagnosis not present

## 2017-02-05 DIAGNOSIS — R600 Localized edema: Secondary | ICD-10-CM

## 2017-02-05 DIAGNOSIS — M25662 Stiffness of left knee, not elsewhere classified: Secondary | ICD-10-CM
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
## 2017-02-05 NOTE — Patient Instructions (Addendum)
Strengthening: Terminal Knee Extension (Supine)    With left knee over bolster, straighten knee by tightening muscles on top of thigh. Keep bottom of knee on bolster. Repeat __10__ times per set. Do __2__ sets per session. Do __2-3__ sessions per day.  http://orth.exer.us/626   Copyright  VHI. All rights reserved.

## 2017-02-12 ENCOUNTER — Encounter: Payer: Self-pay | Admitting: Physical Therapy

## 2017-02-12 ENCOUNTER — Ambulatory Visit: Payer: PPO | Admitting: Physical Therapy

## 2017-02-12 DIAGNOSIS — M25662 Stiffness of left knee, not elsewhere classified: Secondary | ICD-10-CM

## 2017-02-12 DIAGNOSIS — M25562 Pain in left knee: Secondary | ICD-10-CM | POA: Diagnosis not present

## 2017-02-12 DIAGNOSIS — R600 Localized edema: Secondary | ICD-10-CM
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
## 2017-02-13 DIAGNOSIS — M25552 Pain in left hip: Secondary | ICD-10-CM | POA: Diagnosis not present

## 2017-02-19 ENCOUNTER — Ambulatory Visit: Payer: PPO | Admitting: Physical Therapy

## 2017-02-19 ENCOUNTER — Encounter: Payer: Self-pay | Admitting: Physical Therapy

## 2017-02-19 DIAGNOSIS — M25562 Pain in left knee: Secondary | ICD-10-CM | POA: Diagnosis not present

## 2017-02-19 DIAGNOSIS — M25662 Stiffness of left knee, not elsewhere classified: Secondary | ICD-10-CM

## 2017-02-19 DIAGNOSIS — R600 Localized edema: Secondary | ICD-10-CM
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
## 2017-02-19 NOTE — Patient Instructions (Addendum)
Heel Raise: Bilateral (Standing)    Rise on balls of feet. Repeat _10___ times per set. Do __2__ sets per session. Do __2-3__ sessions per day.  http://orth.exer.us/38   Copyright  VHI. All rights reserved.  Toe Raise (Standing)    Rock back on heels. Repeat __10__ times per set. Do __2__ sets per session. Do _2-3___ sessions per day.  http://orth.exer.us/42   Copyright  VHI. All rights reserved.  Strengthening: Straight Leg Raise (Phase 1)    Tighten muscles on front of right thigh, then lift leg __6__ inches from surface, keeping knee locked.  Repeat __10__ times per set. Do __2-3__ sets per session. Do __2-3__ sessions per day.  http://orth.exer.us/614   Copyright  VHI. All rights reserved.  Strengthening: Knee Flexion (Standing)    With support, bend right knee as far as possible. Repeat _10___ times per set. Do __2__ sets per session. Do _2-3___ sessions per day.  http://orth.exer.us/628   Copyright  VHI. All rights reserved.  Straight Leg Raise: With External Leg Rotation    Lie on back with right leg straight, opposite leg bent. Rotate straight leg out with toes pointed to the side and lift __6__ inches. Repeat __10__ times per set. Do _2-3___ sets per session. Do _2-3___ sessions per day.  http://orth.exer.us/728   Copyright  VHI. All rights reserved.  Functional Quadriceps: Sit to Stand    Sit on edge of chair, feet flat on floor. Stand upright, extending knees fully. Repeat __10__ times per set. Do __2__ sets per session. Do __2-3__ sessions per day.  http://orth.exer.us/734   Copyright  VHI. All rights reserved.  Knee Extension (Sitting)    Sit up straight and straighten your left knee fully, lower slowly. NO RESISTANCE! Hold for 5 seconds when fully straight. Repeat __10__ times per set. Do _2___ sets per session. Do __2-3__ sessions per day.  http://orth.exer.us/732   Copyright  VHI. All rights reserved.  Strengthening: Hip  Abduction - Resisted    While standing at counter, bring your left hip straight out to the side but slowly. NO TUBING OR RESISTANCE! Repeat _10___ times per set. Do _2___ sets per session. Do __2-3__ sessions per day.  http://orth.exer.us/634   Copyright  VHI. All rights reserved.  Knee Extension Mobilization: Towel Prop    With rolled towel under right ankle, let your knee relax into extension or press back of knee into table. Hold _5___ minutes. Repeat _10___ times per set. Do _2___ sets per session. Do __2-3__ sessions per day.  http://orth.exer.us/720   Copyright  VHI. All rights reserved.

## 2017-03-05 ENCOUNTER — Ambulatory Visit: Payer: PPO | Attending: Orthopedic Surgery | Admitting: Physical Therapy

## 2017-03-05 DIAGNOSIS — M25662 Stiffness of left knee, not elsewhere classified: Secondary | ICD-10-CM

## 2017-03-05 DIAGNOSIS — R600 Localized edema: Secondary | ICD-10-CM | POA: Insufficient documentation

## 2017-03-05 DIAGNOSIS — M25562 Pain in left knee: Secondary | ICD-10-CM | POA: Diagnosis not present
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
## 2017-03-12 DIAGNOSIS — L821 Other seborrheic keratosis: Secondary | ICD-10-CM | POA: Diagnosis not present

## 2017-03-12 DIAGNOSIS — Z85828 Personal history of other malignant neoplasm of skin: Secondary | ICD-10-CM | POA: Diagnosis not present

## 2017-03-12 DIAGNOSIS — L570 Actinic keratosis: Secondary | ICD-10-CM | POA: Diagnosis not present

## 2017-03-12 DIAGNOSIS — L814 Other melanin hyperpigmentation: Secondary | ICD-10-CM | POA: Diagnosis not present

## 2017-03-12 DIAGNOSIS — D1801 Hemangioma of skin and subcutaneous tissue: Secondary | ICD-10-CM | POA: Diagnosis not present

## 2017-03-17 DIAGNOSIS — M1712 Unilateral primary osteoarthritis, left knee: Secondary | ICD-10-CM | POA: Diagnosis not present

## 2017-03-19 ENCOUNTER — Encounter: Payer: PPO | Admitting: Physical Therapy

## 2017-03-26 DIAGNOSIS — E039 Hypothyroidism, unspecified: Secondary | ICD-10-CM | POA: Diagnosis not present

## 2017-03-26 DIAGNOSIS — M797 Fibromyalgia: Secondary | ICD-10-CM | POA: Diagnosis not present

## 2017-03-26 DIAGNOSIS — I10 Essential (primary) hypertension: Secondary | ICD-10-CM | POA: Diagnosis not present

## 2017-04-04 ENCOUNTER — Other Ambulatory Visit: Payer: Self-pay | Admitting: Obstetrics & Gynecology

## 2017-04-04 NOTE — Telephone Encounter (Signed)
Patient calling to check status of refill request electronically sent by pharmacy.

## 2017-04-04 NOTE — Telephone Encounter (Signed)
Medication refill request: Acyclovir Last AEX:  02/05/16 SM Next AEX: 05/23/17 SM Last MMG (if hormonal medication request): 10/28/16 Laurine Blazer Penn Refill authorized: 02/05/16 #15 2R. Please advise. Thank you.

## 2017-04-28 DIAGNOSIS — I10 Essential (primary) hypertension: Secondary | ICD-10-CM | POA: Diagnosis not present

## 2017-04-28 DIAGNOSIS — M797 Fibromyalgia: Secondary | ICD-10-CM | POA: Diagnosis not present

## 2017-04-28 DIAGNOSIS — Z9109 Other allergy status, other than to drugs and biological substances: Secondary | ICD-10-CM | POA: Diagnosis not present

## 2017-05-08 DIAGNOSIS — M1990 Unspecified osteoarthritis, unspecified site: Secondary | ICD-10-CM | POA: Diagnosis not present

## 2017-05-08 DIAGNOSIS — M791 Myalgia: Secondary | ICD-10-CM | POA: Diagnosis not present

## 2017-05-08 DIAGNOSIS — M797 Fibromyalgia: Secondary | ICD-10-CM | POA: Diagnosis not present

## 2017-05-23 ENCOUNTER — Ambulatory Visit (INDEPENDENT_AMBULATORY_CARE_PROVIDER_SITE_OTHER): Payer: PPO | Admitting: Obstetrics & Gynecology

## 2017-05-23 ENCOUNTER — Other Ambulatory Visit (HOSPITAL_COMMUNITY)
Admission: RE | Admit: 2017-05-23 | Discharge: 2017-05-23 | Disposition: A | Discharge status: Home / Self Care | Payer: PPO | Source: Ambulatory Visit | Attending: Obstetrics & Gynecology | Admitting: Obstetrics & Gynecology

## 2017-05-23 ENCOUNTER — Encounter: Payer: Self-pay | Admitting: Obstetrics & Gynecology

## 2017-05-23 VITALS — BP 132/66 | HR 82 | Resp 14 | Ht 59.25 in | Wt 110.0 lb

## 2017-05-23 DIAGNOSIS — Z124 Encounter for screening for malignant neoplasm of cervix: Secondary | ICD-10-CM | POA: Insufficient documentation

## 2017-05-23 DIAGNOSIS — Z23 Encounter for immunization: Secondary | ICD-10-CM | POA: Diagnosis not present

## 2017-05-23 DIAGNOSIS — Z01419 Encounter for gynecological examination (general) (routine) without abnormal findings: Secondary | ICD-10-CM

## 2017-05-23 DIAGNOSIS — Z1211 Encounter for screening for malignant neoplasm of colon: Secondary | ICD-10-CM | POA: Diagnosis not present

## 2017-05-23 DIAGNOSIS — R87619 Unspecified abnormal cytological findings in specimens from cervix uteri: Secondary | ICD-10-CM | POA: Diagnosis not present

## 2017-05-23 MED ORDER — ACYCLOVIR 400 MG PO TABS: ORAL_TABLET | ORAL | 2 refills | Status: DC
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
## 2017-05-29 LAB — CYTOLOGY - PAP: HPV (WINDOPATH): DETECTED — AB

## 2017-06-02 ENCOUNTER — Telehealth: Payer: Self-pay

## 2017-06-02 DIAGNOSIS — B977 Papillomavirus as the cause of diseases classified elsewhere: Secondary | ICD-10-CM

## 2017-06-02 DIAGNOSIS — R87612 Low grade squamous intraepithelial lesion on cytologic smear of cervix (LGSIL): Secondary | ICD-10-CM

## 2017-06-02 NOTE — Telephone Encounter (Signed)
-----   Message from Megan Salon, MD sent at 05/30/2017  1:12 PM EDT ----- Please let pt know pap is abnormal.  Needs colposcopy.  Thanks.

## 2017-06-02 NOTE — Telephone Encounter (Signed)
Spoke with Jessica Ramsey. Advised of results as seen below from [place]. Jessica Ramsey verbalizes understanding. Jessica Ramsey is postmenopausal. Colposcopy scheduled for 06/16/2017 at 3:30 pm with Dr.Miller.  Instructions given. Motrin 800 mg po x , one hour before appointment with food. Make sure to eat a meal before appointment and drink plenty of fluids. Jessica Ramsey agreeable and verbalized understanding of all instructions. Order placed.  Routing to provider for final review. Jessica Ramsey agreeable to disposition. Will close encounter.

## 2017-06-06 ENCOUNTER — Telehealth: Payer: Self-pay | Admitting: Obstetrics & Gynecology

## 2017-06-06 DIAGNOSIS — S129XXA Fracture of neck, unspecified, initial encounter: Secondary | ICD-10-CM | POA: Diagnosis not present

## 2017-06-06 DIAGNOSIS — M67919 Unspecified disorder of synovium and tendon, unspecified shoulder: Secondary | ICD-10-CM | POA: Diagnosis not present

## 2017-06-06 DIAGNOSIS — M545 Low back pain: Secondary | ICD-10-CM | POA: Diagnosis not present

## 2017-06-06 DIAGNOSIS — S39012A Strain of muscle, fascia and tendon of lower back, initial encounter: Secondary | ICD-10-CM | POA: Diagnosis not present

## 2017-06-06 NOTE — Telephone Encounter (Signed)
Spoke with patient. She is aware and agreeable to appointment information.  Routing to provider. Will close encounter.

## 2017-06-06 NOTE — Telephone Encounter (Signed)
Left voicemail regarding referral appointment. The information is listed below. Should the patient need to cancel or reschedule this appointment, please advise them to call the office they've been referred to in order to reschedule.  [facility]  Phone: [phone]  Dr Collene Mares 06-12-17 @ 1:30pm. Please arrive 15 minutes early and bring your insurance card, photo id and list of medications.

## 2017-06-12 DIAGNOSIS — D509 Iron deficiency anemia, unspecified: Secondary | ICD-10-CM | POA: Diagnosis not present

## 2017-06-12 DIAGNOSIS — Z800 Family history of malignant neoplasm of digestive organs: Secondary | ICD-10-CM | POA: Diagnosis not present

## 2017-06-12 DIAGNOSIS — K219 Gastro-esophageal reflux disease without esophagitis: Secondary | ICD-10-CM | POA: Diagnosis not present

## 2017-06-12 DIAGNOSIS — Z1211 Encounter for screening for malignant neoplasm of colon: Secondary | ICD-10-CM | POA: Diagnosis not present

## 2017-06-12 DIAGNOSIS — K5904 Chronic idiopathic constipation: Secondary | ICD-10-CM | POA: Diagnosis not present

## 2017-06-16 ENCOUNTER — Ambulatory Visit (INDEPENDENT_AMBULATORY_CARE_PROVIDER_SITE_OTHER): Payer: PPO | Admitting: Obstetrics & Gynecology

## 2017-06-16 ENCOUNTER — Encounter: Payer: Self-pay | Admitting: Obstetrics & Gynecology

## 2017-06-16 DIAGNOSIS — R87612 Low grade squamous intraepithelial lesion on cytologic smear of cervix (LGSIL): Secondary | ICD-10-CM

## 2017-06-16 DIAGNOSIS — N870 Mild cervical dysplasia: Secondary | ICD-10-CM | POA: Diagnosis not present

## 2017-06-16 DIAGNOSIS — B977 Papillomavirus as the cause of diseases classified elsewhere: Secondary | ICD-10-CM

## 2017-06-16 MED ORDER — VALACYCLOVIR HCL 500 MG PO TABS: 500.0000 mg | ORAL_TABLET | Freq: Every day | ORAL | 12 refills | Status: DC

## 2017-06-16 NOTE — Progress Notes (Signed)
61 y.o. G1P1 Divorced WF here for colposcopy due LGSIL pap with +HR HPV.  Pt has remote history of abnormal pap smears that was treated with some sort of cream many years ago. Pt cannot recall name and no medication name I mention seems familiar to her.    Did have a new sexual partner this past year.  Patient's last menstrual period was 11/25/2001 (exact date).          Sexually active: Yes.    The current method of family planning is post menopausal status.     Patient has been counseled about results and procedure.  Risks and benefits have bene reviewed including immediate and/or delayed bleeding, infection, cervical scaring from procedure, possibility of needing additional follow up as well as treatment.  rare risks of missing a lesion discussed as well.  All questions answered.  Pt ready to proceed.  BP 122/64 (BP Location: Right Arm, Patient Position: Sitting, Cuff Size: Normal)   Pulse 84   Resp 14   Ht 5' (1.524 m)   Wt 112 lb (50.8 kg)   LMP 11/25/2001 (Exact Date)   BMI 21.87 kg/m   Physical Exam  Constitutional: She appears well-developed and well-nourished.  Genitourinary: Vagina normal. There is no rash, tenderness, lesion or injury on the right labia. There is no rash, tenderness, lesion or injury on the left labia.    Lymphadenopathy:       Right: No inguinal adenopathy present.       Left: No inguinal adenopathy present.   Speculum placed.  3% acetic acid applied to cervix for >45 seconds.  Cervix visualized with both 7.5X and 15X magnification.  Transition zone is deep within endocervical canal and difficult to visualized.   Green filter also used.  Lugols solution was used.  Findings:  No AWE.  Biopsy:  none.  ECC:  was performed.  Monsel's was not needed.  Excellent hemostasis was present.  Pt tolerated procedure well and all instruments were removed.  Findings noted above on picture of cervix.  Assessment:  LGSIL pap smear with +HR HPV Remote hx of abnormal pap  smears  Plan:  Pathology results will be called to patient and follow-up planned pending results.

## 2017-06-16 NOTE — Patient Instructions (Signed)
Valtrex 500mg  daily for suppression.  If you have an outbreak, take twice daily for three days.  Do this for

## 2017-06-20 ENCOUNTER — Telehealth: Payer: Self-pay | Admitting: Obstetrics & Gynecology

## 2017-06-20 MED ORDER — VALACYCLOVIR HCL 500 MG PO TABS: 500.0000 mg | ORAL_TABLET | Freq: Every day | ORAL | 3 refills | Status: DC

## 2017-06-20 NOTE — Telephone Encounter (Signed)
Patient requesting 90 day supply of Valtrex be called into her mail order pharmacy.

## 2017-06-20 NOTE — Telephone Encounter (Signed)
Spoke with patient, request Valtrex RX to go to [facility] for 90 day supply. Advised patient will call [facility] to cancel current RX and place new order -f/u with pharmacy for filling.  New RX placed for Valtrex #90/3RF.  Call to [facility], spoke with Suanne Marker, cancelled RX for Valtrex.  Routing to provider for final review. Patient is agreeable to disposition. Will close encounter.

## 2017-06-23 ENCOUNTER — Telehealth: Payer: Self-pay | Admitting: *Deleted

## 2017-06-23 NOTE — Telephone Encounter (Signed)
Notes recorded by Burnice Logan, RN on 06/23/2017 at 1:24 PM EDT Left message to call Sharee Pimple at [phone].  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 Needs to proceed with LEEP. Ok to schedule.

## 2017-07-01 NOTE — Telephone Encounter (Signed)
Spoke with patient, advised as seen below per Dr. Sabra Heck. Patient scheduled for LEEP on 07/17/17 at 10am with Dr. Sabra Heck. Brief explanation of procedure provided, questions answered. Contraception, postmenopausal. Advised patient to take Motrin 800 mg with food and water one hour before procedure. Recommended patient have someone accompany her to appointment. Patient verbalizes understanding and is agreeable.  Routing to provider for final review. Patient is agreeable to disposition. Will close encounter.  Cc: Theresia Lo

## 2017-07-01 NOTE — Telephone Encounter (Signed)
Left message to call Akelia Husted at [phone].  

## 2017-07-04 DIAGNOSIS — Z111 Encounter for screening for respiratory tuberculosis: Secondary | ICD-10-CM | POA: Diagnosis not present

## 2017-07-16 ENCOUNTER — Other Ambulatory Visit: Payer: Self-pay | Admitting: *Deleted

## 2017-07-16 DIAGNOSIS — R87612 Low grade squamous intraepithelial lesion on cytologic smear of cervix (LGSIL): Secondary | ICD-10-CM

## 2017-07-16 DIAGNOSIS — B977 Papillomavirus as the cause of diseases classified elsewhere: Secondary | ICD-10-CM

## 2017-07-17 ENCOUNTER — Encounter: Payer: Self-pay | Admitting: Obstetrics & Gynecology

## 2017-07-17 ENCOUNTER — Ambulatory Visit (INDEPENDENT_AMBULATORY_CARE_PROVIDER_SITE_OTHER): Payer: PPO | Admitting: Obstetrics & Gynecology

## 2017-07-17 VITALS — BP 108/52 | HR 82 | Resp 14 | Ht 59.25 in | Wt 114.0 lb

## 2017-07-17 DIAGNOSIS — B977 Papillomavirus as the cause of diseases classified elsewhere: Secondary | ICD-10-CM

## 2017-07-17 DIAGNOSIS — R87612 Low grade squamous intraepithelial lesion on cytologic smear of cervix (LGSIL): Secondary | ICD-10-CM

## 2017-07-17 DIAGNOSIS — N870 Mild cervical dysplasia: Secondary | ICD-10-CM | POA: Diagnosis not present

## 2017-07-17 NOTE — Patient Instructions (Signed)
LEEP Post-procedure Instructions . Cramping is common.  You may take Ibuprofen, Aleve, or Tylenol for the cramping.  This should resolve within the next two to three days.   . You may have bright red spotting or blackish discharge for several days after your procedure.  The discharge occurs because of a topical solution used to stop bleeding at the biopsy site(s).  The dark discharge will lighten and then turn clear before completely resolving.  You will need to wear a mini pad during this time. . . Refrain from putting anything in the vagina for four weeks.. . You need to call the office if you have any pelvic pain, fever, heavy bleeding, foul smelling vaginal discharge, or if you are concerned. . Shower or bathe as normal . You will be notified within one week of your biopsy results or we will discuss your results at your follow-up appointment if needed. . You will need to return for surveillance Pap smear(s) as advised by your physician.

## 2017-07-17 NOTE — Progress Notes (Signed)
61 y.o. 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  .      Patient's last menstrual period was 11/25/2001 (exact date).          Sexually active: Yes.    The current method of family planning is post menopausal status.     Pre-procedure vitals: Blood pressure (!) 108/52, pulse 82, resp. rate 14, height 4' 11.25" (1.505 m), weight 114 lb (51.7 kg), last menstrual period 11/25/2001.   Procedure explained and patient's questions were invited and answered.   Consent form signed.  Pre-procedure medication:  About 1 hour before procedure, 800mg  x 1.  Procedure Set-up: Grounding pad located right thigh.  Cautery settings: 50 cut/50 coagulation.  Suction applied to coated speculum.  Procedure:  Speculum placed with good visualization of the cervix.  Colposcopy performed showing:  no visible lesions.  Cervix anesthetized using 2% Xylocaine with 1:100,000units Epinephrine.  Lot:  [id number].  Exp:  06/198 cc's used.  Entire transition zone excised with 10 x 12 loop in 4 passes.  Main portion removed with additional portion at 3 and 9 o'clock positions with deep specimen.  Specimen(s) placed on cork and labeled for pathology.  Hemostasis obtained with ball cautery and Monsel's solution.  EBL:  Minimal  Complications:  none  Patient tolerated procedure well and left the office in satisfactory condition.   Plan:  Pathology will be called to pt.  Follow up results will be called to pt.

## 2017-07-21 ENCOUNTER — Telehealth: Payer: Self-pay

## 2017-07-21 NOTE — Telephone Encounter (Signed)
Spoke with patient. Advised of results as seen below. Verbalizes understanding. Has 1 month follow up scheduled for 08/29/2017 at 11:15 am with Dr.Miller. 6 month follow up for pap scheduled for 01/26/2018 at 2 pm with Dr.Miller. 06 recall placed.

## 2017-07-21 NOTE — Telephone Encounter (Signed)
-----   Message from Megan Salon, MD sent at 07/18/2017  1:50 PM EDT ----- Please let pt know her pathology showed low grade findings only.  They could not clearly say margins were clear.  Will plan to repeat pap in six months.  Ok to go ahead and schedule.  06 recall.  She should also have a recheck in one month.  Thanks.

## 2017-08-01 DIAGNOSIS — D127 Benign neoplasm of rectosigmoid junction: Secondary | ICD-10-CM | POA: Diagnosis not present

## 2017-08-01 DIAGNOSIS — K635 Polyp of colon: Secondary | ICD-10-CM | POA: Diagnosis not present

## 2017-08-01 DIAGNOSIS — K621 Rectal polyp: Secondary | ICD-10-CM | POA: Diagnosis not present

## 2017-08-01 DIAGNOSIS — K5730 Diverticulosis of large intestine without perforation or abscess without bleeding: Secondary | ICD-10-CM | POA: Diagnosis not present

## 2017-08-01 DIAGNOSIS — Z1211 Encounter for screening for malignant neoplasm of colon: Secondary | ICD-10-CM | POA: Diagnosis not present

## 2017-08-01 HISTORY — PX: COLONOSCOPY: SHX174

## 2017-08-07 DIAGNOSIS — Z23 Encounter for immunization: Secondary | ICD-10-CM | POA: Diagnosis not present

## 2017-08-29 ENCOUNTER — Ambulatory Visit (INDEPENDENT_AMBULATORY_CARE_PROVIDER_SITE_OTHER): Payer: PPO | Admitting: Obstetrics & Gynecology

## 2017-08-29 ENCOUNTER — Encounter: Payer: Self-pay | Admitting: Obstetrics & Gynecology

## 2017-08-29 VITALS — BP 110/66 | HR 84 | Resp 16 | Ht 59.25 in | Wt 113.0 lb

## 2017-08-29 DIAGNOSIS — N870 Mild cervical dysplasia: Secondary | ICD-10-CM
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
## 2017-09-05 DIAGNOSIS — M797 Fibromyalgia: Secondary | ICD-10-CM | POA: Diagnosis not present

## 2017-09-05 DIAGNOSIS — M791 Myalgia, unspecified site: Secondary | ICD-10-CM | POA: Diagnosis not present

## 2017-09-05 DIAGNOSIS — M79641 Pain in right hand: Secondary | ICD-10-CM | POA: Diagnosis not present

## 2017-09-05 DIAGNOSIS — M19042 Primary osteoarthritis, left hand: Secondary | ICD-10-CM | POA: Diagnosis not present

## 2017-09-05 DIAGNOSIS — M19041 Primary osteoarthritis, right hand: Secondary | ICD-10-CM | POA: Diagnosis not present

## 2017-09-05 DIAGNOSIS — M79642 Pain in left hand: Secondary | ICD-10-CM | POA: Diagnosis not present

## 2017-09-05 DIAGNOSIS — M79643 Pain in unspecified hand: Secondary | ICD-10-CM | POA: Diagnosis not present

## 2017-09-05 DIAGNOSIS — M1990 Unspecified osteoarthritis, unspecified site: Secondary | ICD-10-CM | POA: Diagnosis not present

## 2017-09-29 DIAGNOSIS — Z87311 Personal history of (healed) other pathological fracture: Secondary | ICD-10-CM | POA: Diagnosis not present

## 2017-09-29 DIAGNOSIS — M4312 Spondylolisthesis, cervical region: Secondary | ICD-10-CM | POA: Diagnosis not present

## 2017-09-29 DIAGNOSIS — M545 Low back pain: Secondary | ICD-10-CM | POA: Diagnosis not present

## 2017-09-29 DIAGNOSIS — M47812 Spondylosis without myelopathy or radiculopathy, cervical region: Secondary | ICD-10-CM | POA: Diagnosis not present

## 2017-09-29 DIAGNOSIS — M542 Cervicalgia: Secondary | ICD-10-CM | POA: Diagnosis not present

## 2017-09-29 DIAGNOSIS — M5430 Sciatica, unspecified side: Secondary | ICD-10-CM | POA: Diagnosis not present

## 2017-09-29 DIAGNOSIS — M4802 Spinal stenosis, cervical region: Secondary | ICD-10-CM | POA: Diagnosis not present

## 2017-10-02 ENCOUNTER — Other Ambulatory Visit: Payer: Self-pay | Admitting: Obstetrics & Gynecology

## 2017-10-02 DIAGNOSIS — Z1231 Encounter for screening mammogram for malignant neoplasm of breast: Secondary | ICD-10-CM

## 2017-10-06 DIAGNOSIS — M542 Cervicalgia: Secondary | ICD-10-CM | POA: Diagnosis not present

## 2017-10-06 DIAGNOSIS — M797 Fibromyalgia: Secondary | ICD-10-CM | POA: Diagnosis not present

## 2017-10-06 DIAGNOSIS — M25531 Pain in right wrist: Secondary | ICD-10-CM | POA: Diagnosis not present

## 2017-10-06 DIAGNOSIS — M25541 Pain in joints of right hand: Secondary | ICD-10-CM | POA: Diagnosis not present

## 2017-10-06 DIAGNOSIS — M6281 Muscle weakness (generalized): Secondary | ICD-10-CM | POA: Diagnosis not present

## 2017-10-06 DIAGNOSIS — M25532 Pain in left wrist: Secondary | ICD-10-CM | POA: Diagnosis not present

## 2017-10-06 DIAGNOSIS — M79643 Pain in unspecified hand: Secondary | ICD-10-CM | POA: Diagnosis not present

## 2017-10-06 DIAGNOSIS — M25542 Pain in joints of left hand: Secondary | ICD-10-CM | POA: Diagnosis not present

## 2017-10-06 DIAGNOSIS — M150 Primary generalized (osteo)arthritis: Secondary | ICD-10-CM | POA: Diagnosis not present

## 2017-10-06 DIAGNOSIS — M25641 Stiffness of right hand, not elsewhere classified: Secondary | ICD-10-CM | POA: Diagnosis not present

## 2017-10-06 DIAGNOSIS — M25642 Stiffness of left hand, not elsewhere classified: Secondary | ICD-10-CM | POA: Diagnosis not present

## 2017-10-06 DIAGNOSIS — I10 Essential (primary) hypertension: Secondary | ICD-10-CM | POA: Diagnosis not present

## 2017-10-06 DIAGNOSIS — M1990 Unspecified osteoarthritis, unspecified site: Secondary | ICD-10-CM | POA: Diagnosis not present

## 2017-10-06 DIAGNOSIS — M791 Myalgia, unspecified site: Secondary | ICD-10-CM | POA: Diagnosis not present

## 2017-10-22 DIAGNOSIS — M4712 Other spondylosis with myelopathy, cervical region: Secondary | ICD-10-CM | POA: Diagnosis not present

## 2017-10-22 DIAGNOSIS — M47812 Spondylosis without myelopathy or radiculopathy, cervical region: Secondary | ICD-10-CM | POA: Diagnosis not present

## 2017-10-22 DIAGNOSIS — M4802 Spinal stenosis, cervical region: Secondary | ICD-10-CM | POA: Diagnosis not present

## 2017-10-22 DIAGNOSIS — Z8781 Personal history of (healed) traumatic fracture: Secondary | ICD-10-CM | POA: Diagnosis not present

## 2017-10-22 DIAGNOSIS — M4322 Fusion of spine, cervical region: Secondary | ICD-10-CM | POA: Diagnosis not present

## 2017-10-22 DIAGNOSIS — M4312 Spondylolisthesis, cervical region: Secondary | ICD-10-CM | POA: Diagnosis not present

## 2017-10-27 DIAGNOSIS — M25641 Stiffness of right hand, not elsewhere classified: Secondary | ICD-10-CM | POA: Diagnosis not present

## 2017-10-27 DIAGNOSIS — M25642 Stiffness of left hand, not elsewhere classified: Secondary | ICD-10-CM | POA: Diagnosis not present

## 2017-10-27 DIAGNOSIS — M6281 Muscle weakness (generalized): Secondary | ICD-10-CM | POA: Diagnosis not present

## 2017-10-27 DIAGNOSIS — M25532 Pain in left wrist: Secondary | ICD-10-CM | POA: Diagnosis not present

## 2017-10-27 DIAGNOSIS — M25531 Pain in right wrist: Secondary | ICD-10-CM | POA: Diagnosis not present

## 2017-10-27 DIAGNOSIS — I10 Essential (primary) hypertension: Secondary | ICD-10-CM | POA: Diagnosis not present

## 2017-10-27 DIAGNOSIS — M150 Primary generalized (osteo)arthritis: Secondary | ICD-10-CM | POA: Diagnosis not present

## 2017-10-27 DIAGNOSIS — M25541 Pain in joints of right hand: Secondary | ICD-10-CM | POA: Diagnosis not present

## 2017-10-27 DIAGNOSIS — M25542 Pain in joints of left hand: Secondary | ICD-10-CM | POA: Diagnosis not present

## 2017-10-31 ENCOUNTER — Ambulatory Visit (HOSPITAL_COMMUNITY): Payer: PPO

## 2017-11-07 ENCOUNTER — Ambulatory Visit (HOSPITAL_COMMUNITY)
Admission: RE | Admit: 2017-11-07 | Discharge: 2017-11-07 | Disposition: A | Discharge status: Home / Self Care | Payer: PPO | Source: Ambulatory Visit | Attending: Obstetrics & Gynecology | Admitting: Obstetrics & Gynecology

## 2017-11-07 DIAGNOSIS — Z1231 Encounter for screening mammogram for malignant neoplasm of breast: Secondary | ICD-10-CM

## 2017-11-27 DIAGNOSIS — M4712 Other spondylosis with myelopathy, cervical region: Secondary | ICD-10-CM | POA: Diagnosis not present

## 2017-11-27 DIAGNOSIS — M438X2 Other specified deforming dorsopathies, cervical region: Secondary | ICD-10-CM | POA: Diagnosis not present

## 2017-11-27 DIAGNOSIS — M4312 Spondylolisthesis, cervical region: Secondary | ICD-10-CM | POA: Diagnosis not present

## 2017-12-26 HISTORY — PX: CERVICAL FUSION: SHX112

## 2017-12-29 DIAGNOSIS — R918 Other nonspecific abnormal finding of lung field: Secondary | ICD-10-CM | POA: Diagnosis not present

## 2017-12-31 DIAGNOSIS — F419 Anxiety disorder, unspecified: Secondary | ICD-10-CM | POA: Diagnosis not present

## 2017-12-31 DIAGNOSIS — Z881 Allergy status to other antibiotic agents status: Secondary | ICD-10-CM | POA: Diagnosis not present

## 2017-12-31 DIAGNOSIS — M797 Fibromyalgia: Secondary | ICD-10-CM | POA: Diagnosis not present

## 2017-12-31 DIAGNOSIS — I10 Essential (primary) hypertension: Secondary | ICD-10-CM | POA: Diagnosis not present

## 2017-12-31 DIAGNOSIS — Z886 Allergy status to analgesic agent status: Secondary | ICD-10-CM | POA: Diagnosis not present

## 2017-12-31 DIAGNOSIS — Z87891 Personal history of nicotine dependence: Secondary | ICD-10-CM | POA: Diagnosis not present

## 2017-12-31 DIAGNOSIS — M4322 Fusion of spine, cervical region: Secondary | ICD-10-CM | POA: Diagnosis not present

## 2017-12-31 DIAGNOSIS — M4712 Other spondylosis with myelopathy, cervical region: Secondary | ICD-10-CM | POA: Diagnosis not present

## 2017-12-31 DIAGNOSIS — Z79899 Other long term (current) drug therapy: Secondary | ICD-10-CM | POA: Diagnosis not present

## 2017-12-31 DIAGNOSIS — Z8249 Family history of ischemic heart disease and other diseases of the circulatory system: Secondary | ICD-10-CM | POA: Diagnosis not present

## 2017-12-31 DIAGNOSIS — M5090 Cervical disc disorder, unspecified, unspecified cervical region: Secondary | ICD-10-CM | POA: Diagnosis not present

## 2017-12-31 DIAGNOSIS — M4312 Spondylolisthesis, cervical region: Secondary | ICD-10-CM | POA: Diagnosis not present

## 2017-12-31 DIAGNOSIS — Z888 Allergy status to other drugs, medicaments and biological substances status: Secondary | ICD-10-CM | POA: Diagnosis not present

## 2017-12-31 DIAGNOSIS — Z880 Allergy status to penicillin: Secondary | ICD-10-CM | POA: Diagnosis not present

## 2017-12-31 DIAGNOSIS — J449 Chronic obstructive pulmonary disease, unspecified: Secondary | ICD-10-CM | POA: Diagnosis not present

## 2018-01-08 DIAGNOSIS — M4712 Other spondylosis with myelopathy, cervical region: Secondary | ICD-10-CM | POA: Diagnosis not present

## 2018-01-08 DIAGNOSIS — Z981 Arthrodesis status: Secondary | ICD-10-CM | POA: Diagnosis not present

## 2018-01-15 ENCOUNTER — Other Ambulatory Visit: Payer: Self-pay

## 2018-01-15 ENCOUNTER — Encounter (HOSPITAL_COMMUNITY): Payer: Self-pay | Admitting: *Deleted

## 2018-01-15 ENCOUNTER — Emergency Department (HOSPITAL_COMMUNITY)
Admission: EM | Admit: 2018-01-15 | Discharge: 2018-01-15 | Disposition: A | Discharge status: Home / Self Care | Payer: PPO | Attending: Emergency Medicine | Admitting: Emergency Medicine

## 2018-01-15 DIAGNOSIS — R404 Transient alteration of awareness: Secondary | ICD-10-CM | POA: Diagnosis not present

## 2018-01-15 DIAGNOSIS — Z79899 Other long term (current) drug therapy: Secondary | ICD-10-CM | POA: Diagnosis not present

## 2018-01-15 DIAGNOSIS — R42 Dizziness and giddiness: Secondary | ICD-10-CM | POA: Diagnosis not present

## 2018-01-15 DIAGNOSIS — E039 Hypothyroidism, unspecified: Secondary | ICD-10-CM | POA: Diagnosis not present

## 2018-01-15 DIAGNOSIS — I10 Essential (primary) hypertension: Secondary | ICD-10-CM | POA: Insufficient documentation

## 2018-01-15 DIAGNOSIS — Z96652 Presence of left artificial knee joint: Secondary | ICD-10-CM | POA: Insufficient documentation

## 2018-01-15 DIAGNOSIS — E860 Dehydration: Secondary | ICD-10-CM | POA: Insufficient documentation

## 2018-01-15 DIAGNOSIS — F17210 Nicotine dependence, cigarettes, uncomplicated: Secondary | ICD-10-CM | POA: Insufficient documentation

## 2018-01-15 DIAGNOSIS — R55 Syncope and collapse: Secondary | ICD-10-CM | POA: Diagnosis not present

## 2018-01-15 LAB — BASIC METABOLIC PANEL
Anion gap: 8 (ref 5–15)
BUN: 14 mg/dL (ref 6–20)
CO2: 25 mmol/L (ref 22–32)
Calcium: 8.2 mg/dL — ABNORMAL LOW (ref 8.9–10.3)
Chloride: 110 mmol/L (ref 101–111)
Creatinine, Ser: 1 mg/dL (ref 0.44–1.00)
GFR calc Af Amer: >60 mL/min (ref >60)
GFR calc non Af Amer: 60 mL/min — ABNORMAL LOW (ref >60)
Glucose, Bld: 103 mg/dL — ABNORMAL HIGH (ref 65–99)
Potassium: 5.1 mmol/L (ref 3.5–5.1)
Sodium: 143 mmol/L (ref 135–145)

## 2018-01-15 LAB — URINALYSIS, ROUTINE W REFLEX MICROSCOPIC
Bilirubin Urine: NEGATIVE
Glucose, UA: NEGATIVE mg/dL
Hgb urine dipstick: NEGATIVE
Ketones, ur: 5 mg/dL — AB
Leukocytes, UA: NEGATIVE
Nitrite: NEGATIVE
Protein, ur: NEGATIVE mg/dL
Specific Gravity, Urine: 1.014 (ref 1.005–1.030)
pH: 6 (ref 5.0–8.0)

## 2018-01-15 LAB — CBC
HCT: 32.9 % — ABNORMAL LOW (ref 36.0–46.0)
Hemoglobin: 10.9 g/dL — ABNORMAL LOW (ref 12.0–15.0)
MCH: 31.1 pg (ref 26.0–34.0)
MCHC: 33.1 g/dL (ref 30.0–36.0)
MCV: 94 fL (ref 78.0–100.0)
Platelets: 259 10*3/uL (ref 150–400)
RBC: 3.5 MIL/uL — ABNORMAL LOW (ref 3.87–5.11)
RDW: 12.3 % (ref 11.5–15.5)
WBC: 15.7 10*3/uL — ABNORMAL HIGH (ref 4.0–10.5)

## 2018-01-15 LAB — CBG MONITORING, ED: Glucose-Capillary: 90 mg/dL (ref 65–99)

## 2018-01-15 LAB — I-STAT TROPONIN, ED: Troponin i, poc: 0 ng/mL (ref 0.00–0.08)

## 2018-01-15 MED ORDER — SODIUM CHLORIDE 0.9 % IV BOLUS (SEPSIS)
1000.0000 mL | Freq: Once | INTRAVENOUS | Status: AC
  Administered 2018-01-15: 1000 mL via INTRAVENOUS

## 2018-01-15 NOTE — Discharge Instructions (Signed)
You have been evaluated for your recent loss of consciousness.  Please stay hydrated. Hold off taking your lisinopril until you can be evaluated by your primary care provider next week. Return if your condition worsen or if you have other concerns.

## 2018-01-15 NOTE — ED Notes (Signed)
ED Provider at bedside. 

## 2018-01-15 NOTE — ED Notes (Signed)
Pt ambulated in hallway without any issues.  No dizziness or lightheadedness.

## 2018-01-15 NOTE — ED Triage Notes (Signed)
Pt was in a restaurant with her daughter, began to feel dizzy, stood, then fainted. Daughter was able to lower her to the floor. Pt attempted to stand, then had a syncopal episode again with two episodes of emesis. Pt pale and diaphoretic on EMS arrival. Initial bp "56/72" per EMS. Pt had neck fusion 2 weeks ago; today has been the longest she has been without wearing her neck brace. Pt is currently on her 2L of NS
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
## 2018-01-15 NOTE — ED Notes (Signed)
Pt verbalizes understanding of d/c instructions. Pt ambulatory at d/c with all belongings.   

## 2018-01-19 DIAGNOSIS — I10 Essential (primary) hypertension: Secondary | ICD-10-CM | POA: Diagnosis not present

## 2018-01-19 DIAGNOSIS — R011 Cardiac murmur, unspecified: Secondary | ICD-10-CM | POA: Diagnosis not present

## 2018-01-19 DIAGNOSIS — Z0000 Encounter for general adult medical examination without abnormal findings: Secondary | ICD-10-CM | POA: Diagnosis not present

## 2018-01-21 DIAGNOSIS — R011 Cardiac murmur, unspecified: Secondary | ICD-10-CM | POA: Diagnosis not present

## 2018-01-21 DIAGNOSIS — R5383 Other fatigue: Secondary | ICD-10-CM | POA: Diagnosis not present

## 2018-01-21 DIAGNOSIS — E039 Hypothyroidism, unspecified: Secondary | ICD-10-CM | POA: Diagnosis not present

## 2018-01-21 DIAGNOSIS — I10 Essential (primary) hypertension: Secondary | ICD-10-CM | POA: Diagnosis not present

## 2018-01-26 ENCOUNTER — Ambulatory Visit: Payer: PPO | Admitting: Obstetrics & Gynecology

## 2018-02-02 DIAGNOSIS — M542 Cervicalgia: Secondary | ICD-10-CM | POA: Diagnosis not present

## 2018-02-02 DIAGNOSIS — M791 Myalgia, unspecified site: Secondary | ICD-10-CM | POA: Diagnosis not present

## 2018-02-02 DIAGNOSIS — M797 Fibromyalgia: Secondary | ICD-10-CM | POA: Diagnosis not present

## 2018-02-02 DIAGNOSIS — M1990 Unspecified osteoarthritis, unspecified site: Secondary | ICD-10-CM | POA: Diagnosis not present

## 2018-02-02 DIAGNOSIS — M79643 Pain in unspecified hand: Secondary | ICD-10-CM | POA: Diagnosis not present

## 2018-02-23 ENCOUNTER — Ambulatory Visit (INDEPENDENT_AMBULATORY_CARE_PROVIDER_SITE_OTHER): Payer: PPO | Admitting: Obstetrics & Gynecology

## 2018-02-23 ENCOUNTER — Other Ambulatory Visit (HOSPITAL_COMMUNITY)
Admission: RE | Admit: 2018-02-23 | Discharge: 2018-02-23 | Disposition: A | Discharge status: Home / Self Care | Payer: PPO | Source: Ambulatory Visit | Attending: Obstetrics & Gynecology | Admitting: Obstetrics & Gynecology

## 2018-02-23 ENCOUNTER — Encounter: Payer: Self-pay | Admitting: Obstetrics & Gynecology

## 2018-02-23 VITALS — BP 134/70 | HR 76 | Resp 14 | Ht 59.25 in | Wt 114.0 lb

## 2018-02-23 DIAGNOSIS — N870 Mild cervical dysplasia: Secondary | ICD-10-CM | POA: Diagnosis not present

## 2018-02-23 DIAGNOSIS — Z01419 Encounter for gynecological examination (general) (routine) without abnormal findings: Secondary | ICD-10-CM | POA: Diagnosis not present
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
## 2018-02-25 LAB — CYTOLOGY - PAP

## 2018-03-03 ENCOUNTER — Other Ambulatory Visit: Payer: Self-pay | Admitting: *Deleted

## 2018-03-03 DIAGNOSIS — R87612 Low grade squamous intraepithelial lesion on cytologic smear of cervix (LGSIL): Secondary | ICD-10-CM

## 2018-03-09 ENCOUNTER — Encounter: Payer: Self-pay | Admitting: Obstetrics & Gynecology

## 2018-03-09 ENCOUNTER — Other Ambulatory Visit: Payer: Self-pay

## 2018-03-09 ENCOUNTER — Ambulatory Visit: Payer: PPO | Admitting: Obstetrics & Gynecology

## 2018-03-09 DIAGNOSIS — R87612 Low grade squamous intraepithelial lesion on cytologic smear of cervix (LGSIL): Secondary | ICD-10-CM | POA: Diagnosis not present

## 2018-03-09 DIAGNOSIS — N870 Mild cervical dysplasia: Secondary | ICD-10-CM | POA: Diagnosis not present

## 2018-03-09 NOTE — Progress Notes (Signed)
62 y.o. G1P1 DWF here for colposcopy due to LGSIL pap with possible HGSIL findings noted at six month follow-up pap done after LEEP showed CIN1 but could not rule out higher grade lesion.  Denies vaginal bleeding.    Patient's last menstrual period was 11/25/2001 (exact date).          Sexually active:  Not currently  The current method of family planning is post menopausal status.     Patient has been counseled about results and procedure.  Risks and benefits have bene reviewed including immediate and/or delayed bleeding, infection, cervical scaring from procedure, possibility of needing additional follow up as well as treatment.  rare risks of missing a lesion discussed as well.  All questions answered.  Pt ready to proceed.  BP 102/66 (BP Location: Right Arm, Patient Position: Sitting, Cuff Size: Normal)   Pulse 80   Resp 16   Ht 4' 11.25" (1.505 m)   Wt 113 lb (51.3 kg)   LMP 11/25/2001 (Exact Date)   BMI 22.63 kg/m   Physical Exam  Constitutional: She is oriented to person, place, and time. She appears well-developed and well-nourished.  Genitourinary: Vagina normal. There is no rash, tenderness, lesion or injury on the right labia. There is no rash, tenderness, lesion or injury on the left labia.    Lymphadenopathy:       Right: No inguinal adenopathy present.       Left: No inguinal adenopathy present.  Neurological: She is alert and oriented to person, place, and time.  Skin: Skin is warm and dry.  Psychiatric: She has a normal mood and affect.    Speculum placed.  3% acetic acid applied to cervix for >45 seconds.  Cervix visualized with both 7.5X and 15X magnification.  Green filter also used.  Lugols solution was used.  Findings:  Small area of mosaicism noted at 12 o'clock but no abnormal staining with Lugol's was noted.  Biopsy:  12 o'clock.  ECC:  was performed.  Monsel's was needed.  Excellent hemostasis was present.  Pt tolerated procedure well and all instruments were  removed.  Findings noted above on picture of cervix.  Assessment:  LGSIL pap with possible HGSIL finding  Plan:  Pathology results will be called to patient and follow-up planned pending results.

## 2018-03-09 NOTE — Patient Instructions (Signed)

## 2018-03-11 ENCOUNTER — Telehealth: Payer: Self-pay | Admitting: *Deleted

## 2018-03-11 NOTE — Telephone Encounter (Signed)
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  This is not high grade but it is not gone and she will likely continue to have abnormal pap smears and the need for colposcopy.  If she is interested in discussing hysterectomy, I am open to this.    CC:  Jessica Ramsey

## 2018-03-11 NOTE — Telephone Encounter (Signed)
Notes recorded by Burnice Logan, RN on 03/11/2018 at 12:45 PM EDT Spoke with patient, advised as seen below per Dr. Sabra Heck. Patient states she would like to discuss hysterectomy, will return call back to office to schedule consult with Dr. Sabra Heck to discuss before proceeding with surgery planning. Patient verbalizes understanding and is agreeable. See telephone encounter dated 03/11/18 to review with provider. ------   Routing to Dr. Lestine Box, will close encounter.

## 2018-03-16 ENCOUNTER — Telehealth: Payer: Self-pay | Admitting: Obstetrics & Gynecology

## 2018-03-16 NOTE — Telephone Encounter (Signed)
Patient is ready to scheduling her surgery consult with Dr.Miller.. To triage to assist with scheduling.

## 2018-03-16 NOTE — Telephone Encounter (Signed)
Spoke with patient. Patient request to schedule surgery consult with Dr. Sabra Heck. See lab result note for labs dated 03/09/18. Consult scheduled for 04/06/18 at 2:30pm. Patient verbalizes understanding.  Routing to provider for final review. Patient is agreeable to disposition. Will close encounter.

## 2018-03-23 ENCOUNTER — Ambulatory Visit: Payer: PPO | Admitting: Cardiology

## 2018-03-23 VITALS — BP 124/72 | HR 68 | Ht 59.5 in | Wt 114.0 lb

## 2018-03-23 DIAGNOSIS — R011 Cardiac murmur, unspecified: Secondary | ICD-10-CM

## 2018-03-23 DIAGNOSIS — I10 Essential (primary) hypertension: Secondary | ICD-10-CM | POA: Diagnosis not present

## 2018-03-23 DIAGNOSIS — Z01810 Encounter for preprocedural cardiovascular examination: Secondary | ICD-10-CM | POA: Diagnosis not present
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
## 2018-03-23 NOTE — Patient Instructions (Signed)
Medication Instructions:  Your physician recommends that you continue on your current medications as directed. Please refer to the Current Medication list given to you today.  If you need a refill on your cardiac medications, please contact your pharmacy first.  Labwork: None ordered   Testing/Procedures: Your physician has requested that you have an echocardiogram. Echocardiography is a painless test that uses sound waves to create images of your heart. It provides your doctor with information about the size and shape of your heart and how well your heart's chambers and valves are working. This procedure takes approximately one hour. There are no restrictions for this procedure.  Follow-Up: Your physician wants you to follow-up as needed with Dr. Radford Pax.  Any Other Special Instructions Will Be Listed Below (If Applicable).   Thank you for choosing [facility], RN  [phone]  If you need a refill on your cardiac medications before your next appointment, please call your pharmacy.

## 2018-03-30 DIAGNOSIS — Z85828 Personal history of other malignant neoplasm of skin: Secondary | ICD-10-CM | POA: Diagnosis not present

## 2018-03-30 DIAGNOSIS — L814 Other melanin hyperpigmentation: Secondary | ICD-10-CM | POA: Diagnosis not present

## 2018-03-30 DIAGNOSIS — D1801 Hemangioma of skin and subcutaneous tissue: Secondary | ICD-10-CM | POA: Diagnosis not present

## 2018-03-30 DIAGNOSIS — L570 Actinic keratosis: Secondary | ICD-10-CM | POA: Diagnosis not present

## 2018-03-30 DIAGNOSIS — D225 Melanocytic nevi of trunk: Secondary | ICD-10-CM | POA: Diagnosis not present

## 2018-03-30 DIAGNOSIS — L821 Other seborrheic keratosis: Secondary | ICD-10-CM | POA: Diagnosis not present

## 2018-03-31 ENCOUNTER — Ambulatory Visit (HOSPITAL_COMMUNITY): Payer: PPO | Attending: Cardiovascular Disease

## 2018-03-31 ENCOUNTER — Other Ambulatory Visit: Payer: Self-pay

## 2018-03-31 DIAGNOSIS — R011 Cardiac murmur, unspecified: Secondary | ICD-10-CM | POA: Diagnosis not present

## 2018-03-31 DIAGNOSIS — I082 Rheumatic disorders of both aortic and tricuspid valves: Secondary | ICD-10-CM | POA: Diagnosis not present

## 2018-04-01 ENCOUNTER — Encounter: Payer: Self-pay | Admitting: Cardiology

## 2018-04-01 DIAGNOSIS — I350 Nonrheumatic aortic (valve) stenosis: Secondary | ICD-10-CM | POA: Insufficient documentation

## 2018-04-02 ENCOUNTER — Telehealth: Payer: Self-pay

## 2018-04-02 DIAGNOSIS — I350 Nonrheumatic aortic (valve) stenosis: Secondary | ICD-10-CM

## 2018-04-02 DIAGNOSIS — I351 Nonrheumatic aortic (valve) insufficiency: Secondary | ICD-10-CM

## 2018-04-02 NOTE — Telephone Encounter (Signed)
Notes recorded by Teressa Senter, RN on 04/02/2018 at 3:17 PM EDT Patient made aware of echo results. She verbalized understanding and informed to repeat echo again in a year to assess AS/AR. Patient in agreement with treatment plan and thankful for the call. Echo ordered to be scheduled in 1 year.   Notes recorded by Sueanne Margarita, MD on 04/01/2018 at 4:08 PM EDT Echo showed normal LVF with increased stiffness of heart muscle, mild to moderate AS and moderate AR and mild TR - repeat echo in 1 year for AS/AR

## 2018-04-06 ENCOUNTER — Ambulatory Visit (INDEPENDENT_AMBULATORY_CARE_PROVIDER_SITE_OTHER): Payer: PPO | Admitting: Obstetrics & Gynecology

## 2018-04-06 ENCOUNTER — Encounter: Payer: Self-pay | Admitting: Obstetrics & Gynecology

## 2018-04-06 VITALS — BP 122/56 | HR 72 | Resp 16 | Ht 59.5 in | Wt 115.0 lb

## 2018-04-06 DIAGNOSIS — R87612 Low grade squamous intraepithelial lesion on cytologic smear of cervix (LGSIL): Secondary | ICD-10-CM
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
## 2018-04-14 DIAGNOSIS — H04123 Dry eye syndrome of bilateral lacrimal glands: Secondary | ICD-10-CM | POA: Diagnosis not present

## 2018-04-14 DIAGNOSIS — H40033 Anatomical narrow angle, bilateral: Secondary | ICD-10-CM | POA: Diagnosis not present

## 2018-06-10 DIAGNOSIS — M438X2 Other specified deforming dorsopathies, cervical region: Secondary | ICD-10-CM | POA: Diagnosis not present

## 2018-06-10 DIAGNOSIS — M4712 Other spondylosis with myelopathy, cervical region: Secondary | ICD-10-CM | POA: Diagnosis not present

## 2018-07-28 DIAGNOSIS — M1990 Unspecified osteoarthritis, unspecified site: Secondary | ICD-10-CM | POA: Diagnosis not present

## 2018-07-28 DIAGNOSIS — I10 Essential (primary) hypertension: Secondary | ICD-10-CM | POA: Diagnosis not present

## 2018-07-28 DIAGNOSIS — Z9109 Other allergy status, other than to drugs and biological substances: Secondary | ICD-10-CM | POA: Diagnosis not present

## 2018-07-28 DIAGNOSIS — Z87891 Personal history of nicotine dependence: Secondary | ICD-10-CM | POA: Diagnosis not present

## 2018-07-28 DIAGNOSIS — M797 Fibromyalgia: Secondary | ICD-10-CM | POA: Diagnosis not present

## 2018-08-07 ENCOUNTER — Encounter: Payer: Self-pay | Admitting: Obstetrics & Gynecology

## 2018-08-07 ENCOUNTER — Other Ambulatory Visit (HOSPITAL_COMMUNITY)
Admission: RE | Admit: 2018-08-07 | Discharge: 2018-08-07 | Disposition: A | Discharge status: Home / Self Care | Payer: PPO | Source: Ambulatory Visit | Attending: Obstetrics & Gynecology | Admitting: Obstetrics & Gynecology

## 2018-08-07 ENCOUNTER — Ambulatory Visit: Payer: PPO | Admitting: Obstetrics & Gynecology

## 2018-08-07 VITALS — BP 112/60 | HR 72 | Resp 16 | Ht 59.5 in | Wt 113.8 lb

## 2018-08-07 DIAGNOSIS — R87612 Low grade squamous intraepithelial lesion on cytologic smear of cervix (LGSIL): Secondary | ICD-10-CM

## 2018-08-07 DIAGNOSIS — N870 Mild cervical dysplasia: Secondary | ICD-10-CM | POA: Insufficient documentation

## 2018-08-07 DIAGNOSIS — Z01419 Encounter for gynecological examination (general) (routine) without abnormal findings: Secondary | ICD-10-CM | POA: Diagnosis not present

## 2018-08-07 MED ORDER — ACYCLOVIR 400 MG PO TABS: 400.0000 mg | ORAL_TABLET | ORAL | 2 refills | Status: DC | PRN
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
## 2018-08-11 LAB — CYTOLOGY - PAP: HPV (WINDOPATH): DETECTED — AB

## 2018-08-13 ENCOUNTER — Telehealth: Payer: Self-pay | Admitting: *Deleted

## 2018-08-13 DIAGNOSIS — R87612 Low grade squamous intraepithelial lesion on cytologic smear of cervix (LGSIL): Secondary | ICD-10-CM

## 2018-08-13 DIAGNOSIS — B977 Papillomavirus as the cause of diseases classified elsewhere: Secondary | ICD-10-CM

## 2018-08-13 NOTE — Telephone Encounter (Signed)
Notes recorded by Burnice Logan, RN on 08/13/2018 at 11:38 AM EDT Left message to call Sharee Pimple at [phone].

## 2018-08-13 NOTE — Telephone Encounter (Signed)
-----   Message from Megan Salon, MD sent at 08/11/2018  6:12 PM EDT ----- Please let pt know her pap showed LGSIL findings with +HR HPV.  Needs repeat colposcopy.  CC:  Lowell Bouton

## 2018-08-18 NOTE — Telephone Encounter (Signed)
Spoke with patient, advised as seen below per Dr. Sabra Heck. Patient is postmenopausal. Patient is familiar with colpo procedure. Colpo scheduled for 10/8 at 11am with Dr. Sabra Heck. Advised to take Motrin 800 mg with food and water one hour before procedure. Patient verbalizes understanding and is agreeable.   Colposcopy order placed for precert.   Routing to [employer] and [employer]   Encounter closed.

## 2018-08-20 DIAGNOSIS — Z23 Encounter for immunization: Secondary | ICD-10-CM | POA: Diagnosis not present

## 2018-09-01 ENCOUNTER — Ambulatory Visit: Payer: PPO | Admitting: Obstetrics & Gynecology

## 2018-09-01 ENCOUNTER — Encounter: Payer: Self-pay | Admitting: Obstetrics & Gynecology

## 2018-09-01 VITALS — BP 124/64 | HR 80 | Resp 16 | Ht 59.5 in | Wt 116.2 lb

## 2018-09-01 DIAGNOSIS — R87612 Low grade squamous intraepithelial lesion on cytologic smear of cervix (LGSIL): Secondary | ICD-10-CM

## 2018-09-01 DIAGNOSIS — B977 Papillomavirus as the cause of diseases classified elsewhere: Secondary | ICD-10-CM

## 2018-09-01 DIAGNOSIS — N870 Mild cervical dysplasia: Secondary | ICD-10-CM | POA: Diagnosis not present

## 2018-09-01 DIAGNOSIS — N39498 Other specified urinary incontinence: Secondary | ICD-10-CM

## 2018-09-01 LAB — POCT URINALYSIS DIPSTICK
BILIRUBIN UA: NEGATIVE
GLUCOSE UA: NEGATIVE
KETONES UA: NEGATIVE
Leukocytes, UA: NEGATIVE
Nitrite, UA: NEGATIVE
PH UA: 6 (ref 5.0–8.0)
Protein, UA: NEGATIVE
RBC UA: NEGATIVE
UROBILINOGEN UA: 0.2 U/dL

## 2018-09-01 NOTE — Progress Notes (Signed)
62 y.o. G1P1 Divorced Caucasian female here for colposcopy with possible biopsies and/or ECC due to LGISL with +HR HPV Pap obtained 08/07/18.    Prior pap smears and evaluation/treatment: 08/07/18 LGSIL pap with +HR HPV 03/09/18 colpo.  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  Could not fully determine margins due to cautery 06/16/17 colpo with [place] 05/23/17 pap LGSIL with +HR HPV   Patient's last menstrual period was 11/25/2001 (exact date).          Sexually active: Yes.    The current method of family planning is post menopausal status.     Patient has been counseled about results and procedure.  Risks and benefits have bene reviewed including immediate and/or delayed bleeding, infection, cervical scaring from procedure, possibility of needing additional follow up as well as treatment.  rare risks of missing a lesion discussed as well.  All questions answered.  Pt ready to proceed.  BP 124/64 (BP Location: Right Arm, Patient Position: Sitting, Cuff Size: Normal)   Pulse 80   Resp 16   Ht 4' 11.5" (1.511 m)   Wt 116 lb 3.2 oz (52.7 kg)   LMP 11/25/2001 (Exact Date)   BMI 23.08 kg/m   Physical Exam  Constitutional: She is oriented to person, place, and time. She appears well-developed and well-nourished.  Genitourinary: Vagina normal. There is no rash, tenderness, lesion or injury on the right labia. There is no rash, tenderness, lesion or injury on the left labia.    Lymphadenopathy:       Right: No inguinal adenopathy present.       Left: No inguinal adenopathy present.  Neurological: She is alert and oriented to person, place, and time.  Skin: Skin is warm and dry.  Psychiatric: She has a normal mood and affect.    Speculum placed.  3% acetic acid applied to cervix for >45 seconds.  Cervix visualized with both 7.5X and 15X magnification.  Green filter also used.  Lugols solution was used.   Findings:  No lesions noted.  Biopsy:  Not obtained.  ECC:  was performed.  Monsel's was not needed.  Excellent hemostasis was present.  Pt tolerated procedure well and all instruments were removed.  Findings noted above on picture of cervix.  Assessment:  LGSIL pap smear with +HR HPV  Plan:  Pathology results will be called to patient and follow-up planned pending results.

## 2018-09-01 NOTE — Patient Instructions (Signed)

## 2018-10-19 ENCOUNTER — Other Ambulatory Visit: Payer: Self-pay | Admitting: Obstetrics & Gynecology

## 2018-10-19 DIAGNOSIS — Z1231 Encounter for screening mammogram for malignant neoplasm of breast: Secondary | ICD-10-CM

## 2018-10-27 DIAGNOSIS — J069 Acute upper respiratory infection, unspecified: Secondary | ICD-10-CM | POA: Diagnosis not present

## 2018-10-30 DIAGNOSIS — M4712 Other spondylosis with myelopathy, cervical region: Secondary | ICD-10-CM | POA: Diagnosis not present

## 2018-10-30 DIAGNOSIS — M438X2 Other specified deforming dorsopathies, cervical region: Secondary | ICD-10-CM | POA: Diagnosis not present

## 2018-11-09 ENCOUNTER — Ambulatory Visit (HOSPITAL_COMMUNITY): Payer: PPO

## 2018-11-20 ENCOUNTER — Ambulatory Visit (HOSPITAL_COMMUNITY): Payer: PPO

## 2018-12-07 ENCOUNTER — Ambulatory Visit (HOSPITAL_COMMUNITY)
Admission: RE | Admit: 2018-12-07 | Discharge: 2018-12-07 | Disposition: A | Discharge status: Home / Self Care | Payer: PPO | Source: Ambulatory Visit | Attending: Obstetrics & Gynecology | Admitting: Obstetrics & Gynecology

## 2018-12-07 DIAGNOSIS — Z1231 Encounter for screening mammogram for malignant neoplasm of breast: Secondary | ICD-10-CM | POA: Insufficient documentation

## 2019-01-05 DIAGNOSIS — E785 Hyperlipidemia, unspecified: Secondary | ICD-10-CM | POA: Diagnosis not present

## 2019-01-05 DIAGNOSIS — E039 Hypothyroidism, unspecified: Secondary | ICD-10-CM | POA: Diagnosis not present

## 2019-01-05 DIAGNOSIS — I10 Essential (primary) hypertension: Secondary | ICD-10-CM | POA: Diagnosis not present

## 2019-01-12 DIAGNOSIS — Z87891 Personal history of nicotine dependence: Secondary | ICD-10-CM | POA: Diagnosis not present

## 2019-01-12 DIAGNOSIS — I10 Essential (primary) hypertension: Secondary | ICD-10-CM | POA: Diagnosis not present

## 2019-01-12 DIAGNOSIS — Z0000 Encounter for general adult medical examination without abnormal findings: Secondary | ICD-10-CM | POA: Diagnosis not present

## 2019-01-12 DIAGNOSIS — I350 Nonrheumatic aortic (valve) stenosis: Secondary | ICD-10-CM | POA: Insufficient documentation

## 2019-01-12 DIAGNOSIS — M797 Fibromyalgia: Secondary | ICD-10-CM | POA: Diagnosis not present

## 2019-01-12 DIAGNOSIS — M1990 Unspecified osteoarthritis, unspecified site: Secondary | ICD-10-CM | POA: Diagnosis not present

## 2019-01-12 DIAGNOSIS — Z9109 Other allergy status, other than to drugs and biological substances: Secondary | ICD-10-CM | POA: Diagnosis not present

## 2019-01-19 DIAGNOSIS — M25561 Pain in right knee: Secondary | ICD-10-CM | POA: Diagnosis not present

## 2019-01-19 DIAGNOSIS — M25562 Pain in left knee: Secondary | ICD-10-CM | POA: Diagnosis not present

## 2019-01-29 ENCOUNTER — Ambulatory Visit: Payer: PPO | Admitting: Obstetrics & Gynecology

## 2019-02-12 DIAGNOSIS — M438X2 Other specified deforming dorsopathies, cervical region: Secondary | ICD-10-CM | POA: Diagnosis not present

## 2019-02-12 DIAGNOSIS — M4312 Spondylolisthesis, cervical region: Secondary | ICD-10-CM | POA: Diagnosis not present

## 2019-02-12 DIAGNOSIS — M4712 Other spondylosis with myelopathy, cervical region: Secondary | ICD-10-CM | POA: Diagnosis not present
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
## 2019-02-26 ENCOUNTER — Other Ambulatory Visit: Payer: Self-pay

## 2019-02-26 ENCOUNTER — Ambulatory Visit: Payer: PPO | Admitting: Obstetrics & Gynecology

## 2019-02-26 ENCOUNTER — Ambulatory Visit: Payer: PPO | Admitting: Obstetrics and Gynecology

## 2019-02-26 ENCOUNTER — Other Ambulatory Visit (HOSPITAL_COMMUNITY)
Admission: RE | Admit: 2019-02-26 | Discharge: 2019-02-26 | Disposition: A | Discharge status: Home / Self Care | Payer: PPO | Source: Ambulatory Visit | Attending: Obstetrics and Gynecology | Admitting: Obstetrics and Gynecology

## 2019-02-26 ENCOUNTER — Encounter: Payer: Self-pay | Admitting: Obstetrics and Gynecology

## 2019-02-26 VITALS — BP 110/60 | HR 60 | Temp 97.7°F | Resp 16 | Wt 114.0 lb

## 2019-02-26 DIAGNOSIS — Z1151 Encounter for screening for human papillomavirus (HPV): Secondary | ICD-10-CM | POA: Insufficient documentation

## 2019-02-26 DIAGNOSIS — Z124 Encounter for screening for malignant neoplasm of cervix: Secondary | ICD-10-CM

## 2019-02-26 DIAGNOSIS — Z780 Asymptomatic menopausal state: Secondary | ICD-10-CM | POA: Diagnosis not present

## 2019-02-26 DIAGNOSIS — Z20828 Contact with and (suspected) exposure to other viral communicable diseases: Secondary | ICD-10-CM | POA: Diagnosis not present

## 2019-02-26 DIAGNOSIS — N870 Mild cervical dysplasia: Secondary | ICD-10-CM

## 2019-02-26 DIAGNOSIS — M85852 Other specified disorders of bone density and structure, left thigh: Secondary | ICD-10-CM | POA: Diagnosis not present

## 2019-02-26 DIAGNOSIS — R87612 Low grade squamous intraepithelial lesion on cytologic smear of cervix (LGSIL): Secondary | ICD-10-CM | POA: Insufficient documentation

## 2019-02-26 DIAGNOSIS — Z01812 Encounter for preprocedural laboratory examination: Secondary | ICD-10-CM | POA: Diagnosis not present

## 2019-02-26 DIAGNOSIS — M810 Age-related osteoporosis without current pathological fracture: Secondary | ICD-10-CM | POA: Diagnosis not present

## 2019-02-26 NOTE — Addendum Note (Signed)
Addended by: Dorothy Spark on: 02/26/2019 02:51 PM   Modules accepted: Orders

## 2019-03-03 LAB — CYTOLOGY - PAP: HPV: DETECTED — AB

## 2019-03-05 ENCOUNTER — Telehealth: Payer: Self-pay | Admitting: Emergency Medicine

## 2019-03-05 NOTE — Telephone Encounter (Signed)
Call to patient.  Mailbox full. Unable to leave message.  Recall in place.  Need to move annual exam to September 2020.

## 2019-03-05 NOTE — Telephone Encounter (Signed)
-----   Message from Salvadore Dom, MD sent at 03/04/2019  4:07 PM EDT ----- Please let the patient know that her pap is stable with LSIL, +HPV. She needs a repeat pap with Dr Sabra Heck at the time of her annual exam in the fall. Please make sure this is scheduled. 06 recall

## 2019-03-09 ENCOUNTER — Ambulatory Visit: Payer: PPO | Admitting: Obstetrics & Gynecology

## 2019-03-17 ENCOUNTER — Other Ambulatory Visit: Payer: Self-pay

## 2019-03-17 MED ORDER — ACYCLOVIR 400 MG PO TABS: 400.0000 mg | ORAL_TABLET | ORAL | 2 refills | Status: DC | PRN

## 2019-03-17 NOTE — Telephone Encounter (Signed)
Medication refill request: valacyclovir 500mg  Last AEX:  08-07-18 Next AEX: 12-07-2019 Last MMG (if hormonal medication request): 11/2018 neg Refill authorized: mail order requesting valtrex with a 90 day rx. Please approve if appropriate

## 2019-03-17 NOTE — Telephone Encounter (Signed)
Spoke with patient, advised as seen below per Dr. Talbert Nan. AEX rescheduled to 08/10/19 at 3:30pm with Dr. Sabra Heck. Patient verbalizes understanding and is agreeable.   06 recall  Encounter closed.

## 2019-03-17 NOTE — Telephone Encounter (Signed)
I don't see a prior script for Valtrex. Dr Sabra Heck gave her acyclovir for prn use at her annual exam in 9/19. Can you please check with the patient if she wants valtrex or acyclovir and confirm that she is taking it prn. Thanks

## 2019-03-17 NOTE — Telephone Encounter (Signed)
Spoke with patient. Patient states she has a printed RX for acyclovir that she never had filled written 07/2018 by Dr. Sabra Heck. Takes PRN. Patient requesting acyclovir 400 mg RX to [facility] order pharmacy. Rx for acyclovir 400 mg tab to [facility] order. See telephone encounter dated 03/05/19, pap results reviewed. Patient verbalizes understanding and is agreeable.   Routing to provider for final review. Patient is agreeable to disposition. Will close encounter.

## 2019-03-19 ENCOUNTER — Telehealth: Payer: Self-pay | Admitting: Obstetrics & Gynecology

## 2019-03-19 NOTE — Telephone Encounter (Signed)
Anderson Malta from [facility] calling to see if patient can get 90 day supply on Acyclovir.

## 2019-03-19 NOTE — Telephone Encounter (Signed)
Per conversation with patient on 4/22, takes acyclovir prn. Rx sent for acyclovir po 400mg  #30/2RF to [facility] order.   Dr. Sabra Heck- please advise on new RX.

## 2019-03-22 MED ORDER — ACYCLOVIR 400 MG PO TABS: 400.0000 mg | ORAL_TABLET | ORAL | 1 refills | Status: DC | PRN

## 2019-03-22 NOTE — Telephone Encounter (Signed)
Rx done for 90 day supply.  Ok to close encounter.

## 2019-05-24 ENCOUNTER — Telehealth (HOSPITAL_COMMUNITY): Payer: Self-pay

## 2019-05-24 NOTE — Telephone Encounter (Signed)
LMTCB COVID prescreening for echo. Left echo appt details per DPR. 

## 2019-05-25 ENCOUNTER — Ambulatory Visit (HOSPITAL_COMMUNITY): Payer: PPO | Attending: Cardiology

## 2019-05-25 ENCOUNTER — Other Ambulatory Visit: Payer: Self-pay

## 2019-05-25 DIAGNOSIS — I350 Nonrheumatic aortic (valve) stenosis: Secondary | ICD-10-CM | POA: Diagnosis not present

## 2019-05-25 DIAGNOSIS — I351 Nonrheumatic aortic (valve) insufficiency: Secondary | ICD-10-CM

## 2019-07-16 DIAGNOSIS — L570 Actinic keratosis: Secondary | ICD-10-CM | POA: Diagnosis not present

## 2019-07-16 DIAGNOSIS — L821 Other seborrheic keratosis: Secondary | ICD-10-CM | POA: Diagnosis not present

## 2019-07-16 DIAGNOSIS — Q828 Other specified congenital malformations of skin: Secondary | ICD-10-CM | POA: Diagnosis not present

## 2019-07-16 DIAGNOSIS — D485 Neoplasm of uncertain behavior of skin: Secondary | ICD-10-CM | POA: Diagnosis not present

## 2019-07-16 DIAGNOSIS — L820 Inflamed seborrheic keratosis: Secondary | ICD-10-CM | POA: Diagnosis not present

## 2019-07-16 DIAGNOSIS — C44319 Basal cell carcinoma of skin of other parts of face: Secondary | ICD-10-CM | POA: Diagnosis not present

## 2019-07-16 DIAGNOSIS — D1801 Hemangioma of skin and subcutaneous tissue: Secondary | ICD-10-CM | POA: Diagnosis not present

## 2019-08-03 DIAGNOSIS — I10 Essential (primary) hypertension: Secondary | ICD-10-CM | POA: Diagnosis not present

## 2019-08-03 DIAGNOSIS — Z1159 Encounter for screening for other viral diseases: Secondary | ICD-10-CM | POA: Diagnosis not present

## 2019-08-03 DIAGNOSIS — Z23 Encounter for immunization: Secondary | ICD-10-CM | POA: Diagnosis not present

## 2019-08-03 DIAGNOSIS — M1990 Unspecified osteoarthritis, unspecified site: Secondary | ICD-10-CM | POA: Diagnosis not present

## 2019-08-03 DIAGNOSIS — G4700 Insomnia, unspecified: Secondary | ICD-10-CM | POA: Diagnosis not present

## 2019-08-03 DIAGNOSIS — M797 Fibromyalgia: Secondary | ICD-10-CM | POA: Diagnosis not present

## 2019-08-03 DIAGNOSIS — Z122 Encounter for screening for malignant neoplasm of respiratory organs: Secondary | ICD-10-CM | POA: Diagnosis not present

## 2019-08-03 DIAGNOSIS — E039 Hypothyroidism, unspecified: Secondary | ICD-10-CM | POA: Diagnosis not present

## 2019-08-06 ENCOUNTER — Other Ambulatory Visit: Payer: Self-pay

## 2019-08-10 ENCOUNTER — Other Ambulatory Visit (HOSPITAL_COMMUNITY)
Admission: RE | Admit: 2019-08-10 | Discharge: 2019-08-10 | Disposition: A | Discharge status: Home / Self Care | Payer: PPO | Source: Ambulatory Visit | Attending: Obstetrics & Gynecology | Admitting: Obstetrics & Gynecology

## 2019-08-10 ENCOUNTER — Ambulatory Visit (INDEPENDENT_AMBULATORY_CARE_PROVIDER_SITE_OTHER): Payer: PPO | Admitting: Obstetrics & Gynecology

## 2019-08-10 ENCOUNTER — Other Ambulatory Visit: Payer: Self-pay

## 2019-08-10 ENCOUNTER — Encounter: Payer: Self-pay | Admitting: Obstetrics & Gynecology

## 2019-08-10 VITALS — BP 128/64 | HR 80 | Temp 97.7°F | Resp 16 | Ht 59.25 in | Wt 109.2 lb

## 2019-08-10 DIAGNOSIS — R87810 Cervical high risk human papillomavirus (HPV) DNA test positive: Secondary | ICD-10-CM | POA: Diagnosis not present

## 2019-08-10 DIAGNOSIS — B977 Papillomavirus as the cause of diseases classified elsewhere: Secondary | ICD-10-CM | POA: Diagnosis not present

## 2019-08-10 DIAGNOSIS — Z01411 Encounter for gynecological examination (general) (routine) with abnormal findings: Secondary | ICD-10-CM | POA: Diagnosis not present

## 2019-08-10 DIAGNOSIS — R87612 Low grade squamous intraepithelial lesion on cytologic smear of cervix (LGSIL): Secondary | ICD-10-CM | POA: Diagnosis not present

## 2019-08-10 DIAGNOSIS — Z01419 Encounter for gynecological examination (general) (routine) without abnormal findings: Secondary | ICD-10-CM | POA: Diagnosis not present

## 2019-08-10 DIAGNOSIS — Z1151 Encounter for screening for human papillomavirus (HPV): Secondary | ICD-10-CM | POA: Diagnosis not present

## 2019-08-10 MED ORDER — ACYCLOVIR 400 MG PO TABS: 400.0000 mg | ORAL_TABLET | ORAL | 4 refills | Status: DC | PRN

## 2019-08-10 NOTE — Patient Instructions (Signed)
Valtrex 500mg  daily for suppressive therapy.  If has an outbreak, take 500mg  twice daily for 3 days.    If you still have some acyclovir, you take it for 5 days.
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
## 2019-08-18 LAB — CYTOLOGY - PAP
HPV 16: NEGATIVE
HPV 18 / 45: NEGATIVE
High risk HPV: POSITIVE — AB
Molecular Disclaimer: 56
Molecular Disclaimer: DETECTED
Molecular Disclaimer: NEGATIVE
Molecular Disclaimer: NORMAL
Molecular Disclaimer: NORMAL

## 2019-08-25 ENCOUNTER — Telehealth: Payer: Self-pay | Admitting: *Deleted

## 2019-08-25 DIAGNOSIS — R87612 Low grade squamous intraepithelial lesion on cytologic smear of cervix (LGSIL): Secondary | ICD-10-CM

## 2019-08-25 DIAGNOSIS — B977 Papillomavirus as the cause of diseases classified elsewhere: Secondary | ICD-10-CM

## 2019-08-25 NOTE — Telephone Encounter (Signed)
Notes recorded by Burnice Logan, RN on 08/25/2019 at 8:52 AM EDT  Left message to call Sharee Pimple, RN at [facility].    Order placed for colposcopy

## 2019-08-25 NOTE — Telephone Encounter (Signed)
-----   Message from Megan Salon, MD sent at 08/23/2019 12:43 PM EDT ----- Please let tp know her pap showed LGSIL and there was still +HR HPV.  Needs colposcopy.

## 2019-09-02 NOTE — Telephone Encounter (Signed)
1 mo recall placed.  

## 2019-09-02 NOTE — Telephone Encounter (Signed)
Patient left message on answering machine Wednesday evening returning call regarding pap results. Ok to leave a detailed message on voicemail.

## 2019-09-02 NOTE — Telephone Encounter (Signed)
Call returned to patient, left detailed message per patients request, name identified on voicemail. Advised of pap results as seen below per Dr. Sabra Heck. Order placed for colpo precert, advised patient to return call to office to schedule.   Routing to [employer] and [employer]  for [employer].

## 2019-09-03 DIAGNOSIS — H04123 Dry eye syndrome of bilateral lacrimal glands: Secondary | ICD-10-CM | POA: Diagnosis not present

## 2019-09-03 DIAGNOSIS — H40033 Anatomical narrow angle, bilateral: Secondary | ICD-10-CM | POA: Diagnosis not present

## 2019-09-06 NOTE — Telephone Encounter (Signed)
Call placed to patient to review benefit and scheduled colposcopy. Left voicemail message requesting a return call   cc: Magdalene Patricia

## 2019-09-07 NOTE — Telephone Encounter (Signed)
Spoke with patient regarding benefit for a colposcopy. Patient acknowledges understanding of information presented. Patient is scheduled 09/10/2019 with Dr Sabra Heck. Patient is aware of the appointment date, arrival time and cancellation policy. No further questions. Will close encounter.

## 2019-09-10 ENCOUNTER — Encounter: Payer: Self-pay | Admitting: Obstetrics & Gynecology

## 2019-09-10 ENCOUNTER — Other Ambulatory Visit: Payer: Self-pay

## 2019-09-10 ENCOUNTER — Other Ambulatory Visit: Payer: Self-pay | Admitting: Obstetrics & Gynecology

## 2019-09-10 ENCOUNTER — Ambulatory Visit: Payer: PPO | Admitting: Obstetrics & Gynecology

## 2019-09-10 DIAGNOSIS — N870 Mild cervical dysplasia: Secondary | ICD-10-CM | POA: Diagnosis not present

## 2019-09-10 DIAGNOSIS — R87612 Low grade squamous intraepithelial lesion on cytologic smear of cervix (LGSIL): Secondary | ICD-10-CM

## 2019-09-10 DIAGNOSIS — B977 Papillomavirus as the cause of diseases classified elsewhere: Secondary | ICD-10-CM

## 2019-09-10 NOTE — Patient Instructions (Addendum)

## 2019-09-10 NOTE — Progress Notes (Signed)
63 y.o. Divorced White female here for colposcopy with possible biopsies and/or ECC due to LGSIL Pap with +HR HPV obtained 08/10/2019.  She does have a hx of abnormal pap smears and was treated with LEEP 8/18 showing CIN 1 only.  Subsequent pap smears have continued to be LGSIL except 4/19 showing a few cells suspicious for high grade lesion.  Patient's last menstrual period was 11/25/2001 (exact date).          Sexually active: No.  The current method of family planning is post menopausal status.     Patient has been counseled about results and procedure.  Risks and benefits have bene reviewed including immediate and/or delayed bleeding, infection, cervical scaring from procedure, possibility of needing additional follow up as well as treatment.  rare risks of missing a lesion discussed as well.  All questions answered.  Pt ready to proceed.  BP 128/80   Pulse 76   Temp (!) 97.1 F (36.2 C) (Temporal)   Ht 4' 11.25" (1.505 m)   Wt 109 lb 12.8 oz (49.8 kg)   LMP 11/25/2001 (Exact Date)   BMI 21.99 kg/m   Physical Exam  Genitourinary:    Vagina normal.  There is no rash, tenderness, lesion or injury on the right labia. There is no rash, tenderness, lesion or injury on the left labia.     Lymphadenopathy:       Right: No inguinal adenopathy present.       Left: No inguinal adenopathy present.    Speculum placed.  3% acetic acid applied to cervix for >45 seconds.  Cervix visualized with both 7.5X and 15X magnification.  Green filter also used.  Lugols solution was used.  Findings:  AWE noted at 3-4 o'clock.  Biopsy:  4 o'clock biopsy obtained.  ECC:  was performed.  Monsel's was not needed.  Excellent hemostasis was present.  Pt tolerated procedure well and all instruments were removed.   Findings noted above on picture of cervix.  Assessment:  LGSIL pap  Plan:  Pathology results will be called to patient and follow-up planned pending results.

## 2019-09-27 DIAGNOSIS — C44311 Basal cell carcinoma of skin of nose: Secondary | ICD-10-CM | POA: Diagnosis not present

## 2019-10-01 ENCOUNTER — Telehealth: Payer: Self-pay | Admitting: Obstetrics & Gynecology

## 2019-10-01 NOTE — Telephone Encounter (Signed)
Dr. Sabra Heck -please review 09/10/19 colpo results and advise.

## 2019-10-01 NOTE — Telephone Encounter (Signed)
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  Need repeat pap smear and HR HPV testing 12 months.  Ok to remove from current recall and place new recall.  Thanks.

## 2019-10-01 NOTE — Telephone Encounter (Signed)
Results sent to you in result note.  Thanks.

## 2019-10-01 NOTE — Telephone Encounter (Signed)
Spoke with patient, advised of results per Dr. Sabra Heck. Patient verbalizes understanding and is agreeable.   Removed from current recall.  12 recall placed.   Encounter closed.

## 2019-10-01 NOTE — Telephone Encounter (Signed)
Patient is calling regarding Colpo results.

## 2019-11-26 DIAGNOSIS — B977 Papillomavirus as the cause of diseases classified elsewhere: Secondary | ICD-10-CM

## 2019-11-26 HISTORY — DX: Papillomavirus as the cause of diseases classified elsewhere: B97.7

## 2019-12-07 ENCOUNTER — Ambulatory Visit: Payer: PPO | Admitting: Obstetrics & Gynecology

## 2019-12-14 ENCOUNTER — Other Ambulatory Visit (HOSPITAL_COMMUNITY): Payer: Self-pay | Admitting: Physical Medicine and Rehabilitation

## 2019-12-14 DIAGNOSIS — Z1231 Encounter for screening mammogram for malignant neoplasm of breast: Secondary | ICD-10-CM

## 2019-12-20 ENCOUNTER — Ambulatory Visit (HOSPITAL_COMMUNITY): Payer: PPO

## 2019-12-24 ENCOUNTER — Ambulatory Visit: Payer: PPO | Attending: Internal Medicine

## 2019-12-24 ENCOUNTER — Other Ambulatory Visit: Payer: Self-pay

## 2019-12-24 DIAGNOSIS — Z20822 Contact with and (suspected) exposure to covid-19: Secondary | ICD-10-CM | POA: Diagnosis not present

## 2019-12-25 LAB — NOVEL CORONAVIRUS, NAA: SARS-CoV-2, NAA: NOT DETECTED

## 2019-12-27 ENCOUNTER — Other Ambulatory Visit: Payer: Self-pay

## 2019-12-27 ENCOUNTER — Ambulatory Visit (HOSPITAL_COMMUNITY)
Admission: RE | Admit: 2019-12-27 | Discharge: 2019-12-27 | Disposition: A | Discharge status: Home / Self Care | Payer: PPO | Source: Ambulatory Visit | Attending: Physical Medicine and Rehabilitation | Admitting: Physical Medicine and Rehabilitation

## 2019-12-27 DIAGNOSIS — Z1231 Encounter for screening mammogram for malignant neoplasm of breast: Secondary | ICD-10-CM | POA: Insufficient documentation

## 2020-01-11 DIAGNOSIS — I350 Nonrheumatic aortic (valve) stenosis: Secondary | ICD-10-CM | POA: Diagnosis not present

## 2020-01-11 DIAGNOSIS — M797 Fibromyalgia: Secondary | ICD-10-CM | POA: Diagnosis not present

## 2020-01-11 DIAGNOSIS — I10 Essential (primary) hypertension: Secondary | ICD-10-CM | POA: Diagnosis not present

## 2020-01-11 DIAGNOSIS — Z0000 Encounter for general adult medical examination without abnormal findings: Secondary | ICD-10-CM | POA: Diagnosis not present

## 2020-01-11 DIAGNOSIS — Z87891 Personal history of nicotine dependence: Secondary | ICD-10-CM | POA: Diagnosis not present

## 2020-01-12 ENCOUNTER — Other Ambulatory Visit: Payer: Self-pay | Admitting: Family Medicine

## 2020-01-12 DIAGNOSIS — Z87891 Personal history of nicotine dependence: Secondary | ICD-10-CM

## 2020-01-17 ENCOUNTER — Inpatient Hospital Stay: Admission: RE | Admit: 2020-01-17 | Payer: PPO | Source: Ambulatory Visit

## 2020-01-25 ENCOUNTER — Ambulatory Visit
Admission: RE | Admit: 2020-01-25 | Discharge: 2020-01-25 | Disposition: A | Discharge status: Home / Self Care | Payer: PPO | Source: Ambulatory Visit | Attending: Family Medicine | Admitting: Family Medicine

## 2020-01-25 DIAGNOSIS — F17210 Nicotine dependence, cigarettes, uncomplicated: Secondary | ICD-10-CM | POA: Diagnosis not present

## 2020-01-25 DIAGNOSIS — Z87891 Personal history of nicotine dependence: Secondary | ICD-10-CM

## 2020-03-23 DIAGNOSIS — M8949 Other hypertrophic osteoarthropathy, multiple sites: Secondary | ICD-10-CM | POA: Diagnosis not present

## 2020-03-23 DIAGNOSIS — M62838 Other muscle spasm: Secondary | ICD-10-CM | POA: Diagnosis not present

## 2020-03-23 DIAGNOSIS — S30861A Insect bite (nonvenomous) of abdominal wall, initial encounter: Secondary | ICD-10-CM | POA: Diagnosis not present

## 2020-03-23 DIAGNOSIS — W57XXXA Bitten or stung by nonvenomous insect and other nonvenomous arthropods, initial encounter: Secondary | ICD-10-CM | POA: Diagnosis not present

## 2020-05-15 DIAGNOSIS — L84 Corns and callosities: Secondary | ICD-10-CM | POA: Diagnosis not present

## 2020-05-15 DIAGNOSIS — W57XXXA Bitten or stung by nonvenomous insect and other nonvenomous arthropods, initial encounter: Secondary | ICD-10-CM | POA: Diagnosis not present

## 2020-05-15 DIAGNOSIS — R2689 Other abnormalities of gait and mobility: Secondary | ICD-10-CM | POA: Diagnosis not present

## 2020-05-15 DIAGNOSIS — S30861A Insect bite (nonvenomous) of abdominal wall, initial encounter: Secondary | ICD-10-CM | POA: Diagnosis not present

## 2020-05-15 DIAGNOSIS — M542 Cervicalgia: Secondary | ICD-10-CM | POA: Diagnosis not present

## 2020-05-15 DIAGNOSIS — M8949 Other hypertrophic osteoarthropathy, multiple sites: Secondary | ICD-10-CM | POA: Diagnosis not present

## 2020-05-31 DIAGNOSIS — J0110 Acute frontal sinusitis, unspecified: Secondary | ICD-10-CM | POA: Diagnosis not present

## 2020-06-28 ENCOUNTER — Telehealth: Payer: Self-pay | Admitting: Neurology

## 2020-06-28 ENCOUNTER — Encounter: Payer: Self-pay | Admitting: Neurology

## 2020-06-28 ENCOUNTER — Ambulatory Visit: Payer: PPO | Admitting: Neurology

## 2020-06-28 VITALS — BP 132/64 | HR 63 | Ht 60.0 in | Wt 107.0 lb

## 2020-06-28 DIAGNOSIS — M797 Fibromyalgia: Secondary | ICD-10-CM

## 2020-06-28 DIAGNOSIS — R55 Syncope and collapse: Secondary | ICD-10-CM

## 2020-06-28 NOTE — Telephone Encounter (Signed)
health team order sent to GI. No auth they will reach out to the patient to schedule.  
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
## 2020-07-03 ENCOUNTER — Telehealth: Payer: Self-pay | Admitting: Neurology

## 2020-07-03 NOTE — Telephone Encounter (Signed)
Pt called and LVM needing the RN to call her back to discuss the blood work that is needing to be drawn. Her PCP is wanting to know what exactly is needing to be checked. Please advise.

## 2020-07-03 NOTE — Telephone Encounter (Signed)
LVM informing patient Dr Jannifer Franklin' note indicated he needs her renal or kidney function checked. I advised her PCP will know what labs to draw. Left # for other questions.

## 2020-07-07 DIAGNOSIS — E785 Hyperlipidemia, unspecified: Secondary | ICD-10-CM | POA: Diagnosis not present

## 2020-07-07 DIAGNOSIS — I10 Essential (primary) hypertension: Secondary | ICD-10-CM | POA: Diagnosis not present

## 2020-07-07 DIAGNOSIS — E039 Hypothyroidism, unspecified: Secondary | ICD-10-CM | POA: Diagnosis not present

## 2020-07-11 DIAGNOSIS — L821 Other seborrheic keratosis: Secondary | ICD-10-CM | POA: Diagnosis not present

## 2020-07-11 DIAGNOSIS — L570 Actinic keratosis: Secondary | ICD-10-CM | POA: Diagnosis not present

## 2020-07-11 DIAGNOSIS — Z85828 Personal history of other malignant neoplasm of skin: Secondary | ICD-10-CM | POA: Diagnosis not present

## 2020-07-11 DIAGNOSIS — D1801 Hemangioma of skin and subcutaneous tissue: Secondary | ICD-10-CM | POA: Diagnosis not present

## 2020-07-11 DIAGNOSIS — D485 Neoplasm of uncertain behavior of skin: Secondary | ICD-10-CM | POA: Diagnosis not present

## 2020-07-11 DIAGNOSIS — L905 Scar conditions and fibrosis of skin: Secondary | ICD-10-CM | POA: Diagnosis not present

## 2020-07-11 DIAGNOSIS — C44529 Squamous cell carcinoma of skin of other part of trunk: Secondary | ICD-10-CM | POA: Diagnosis not present

## 2020-07-18 DIAGNOSIS — M797 Fibromyalgia: Secondary | ICD-10-CM | POA: Diagnosis not present

## 2020-07-18 DIAGNOSIS — I10 Essential (primary) hypertension: Secondary | ICD-10-CM | POA: Diagnosis not present

## 2020-07-18 DIAGNOSIS — M8949 Other hypertrophic osteoarthropathy, multiple sites: Secondary | ICD-10-CM | POA: Diagnosis not present

## 2020-07-18 DIAGNOSIS — G4700 Insomnia, unspecified: Secondary | ICD-10-CM | POA: Diagnosis not present

## 2020-07-18 DIAGNOSIS — I350 Nonrheumatic aortic (valve) stenosis: Secondary | ICD-10-CM | POA: Diagnosis not present

## 2020-07-21 DIAGNOSIS — L905 Scar conditions and fibrosis of skin: Secondary | ICD-10-CM | POA: Diagnosis not present

## 2020-07-21 DIAGNOSIS — D045 Carcinoma in situ of skin of trunk: Secondary | ICD-10-CM | POA: Diagnosis not present

## 2020-08-04 ENCOUNTER — Ambulatory Visit
Admission: RE | Admit: 2020-08-04 | Discharge: 2020-08-04 | Disposition: A | Discharge status: Home / Self Care | Payer: PPO | Source: Ambulatory Visit | Attending: Neurology | Admitting: Neurology

## 2020-08-04 ENCOUNTER — Other Ambulatory Visit: Payer: Self-pay

## 2020-08-04 DIAGNOSIS — R55 Syncope and collapse: Secondary | ICD-10-CM

## 2020-08-04 DIAGNOSIS — I6523 Occlusion and stenosis of bilateral carotid arteries: Secondary | ICD-10-CM | POA: Diagnosis not present

## 2020-08-04 MED ORDER — IOPAMIDOL (ISOVUE-370) INJECTION 76%
75.0000 mL | Freq: Once | INTRAVENOUS | Status: AC | PRN
  Administered 2020-08-04: 75 mL via INTRAVENOUS

## 2020-08-06 ENCOUNTER — Telehealth: Payer: Self-pay | Admitting: Neurology

## 2020-08-06 NOTE — Telephone Encounter (Signed)
I called the patient.  The CT angiogram of the head neck did not show any critical stenosis that should be leading to dizziness and near syncope.  If she continues to have these events, she should contact her cardiologist and get a further evaluation.   CTA head and neck 08/06/20:  IMPRESSION: CT head:  1. No evidence of acute intracranial abnormality. 2. Redemonstrated small chronic infarct within the right cerebellar hemisphere.  CTA neck:  1. The bilateral common and internal carotid arteries are patent within the neck without stenosis. Minimal mixed plaque within the proximal ICAs bilaterally. 2. The vertebral arteries are patent within the neck bilaterally. Soft plaque results in moderate narrowing of the proximal V1 non dominant left vertebral artery. 3. Prior C4-C7 ACDF. 4. Chronic mildly displaced fracture deformity of the right C2 pedicle with chronic C2-C3 grade 1 anterolisthesis.  CTA head:  1. No intracranial large vessel occlusion or proximal high-grade arterial stenosis. 2. 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

## 2020-08-08 DIAGNOSIS — Z23 Encounter for immunization: Secondary | ICD-10-CM | POA: Diagnosis not present
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
## 2020-08-11 ENCOUNTER — Encounter: Payer: Self-pay | Admitting: Obstetrics & Gynecology

## 2020-08-11 ENCOUNTER — Other Ambulatory Visit: Payer: Self-pay

## 2020-08-11 ENCOUNTER — Other Ambulatory Visit (HOSPITAL_COMMUNITY)
Admission: RE | Admit: 2020-08-11 | Discharge: 2020-08-11 | Disposition: A | Discharge status: Home / Self Care | Payer: PPO | Source: Ambulatory Visit | Attending: Obstetrics & Gynecology | Admitting: Obstetrics & Gynecology

## 2020-08-11 ENCOUNTER — Ambulatory Visit (INDEPENDENT_AMBULATORY_CARE_PROVIDER_SITE_OTHER): Payer: PPO | Admitting: Obstetrics & Gynecology

## 2020-08-11 VITALS — BP 110/68 | HR 68 | Resp 16 | Ht 59.25 in | Wt 110.0 lb

## 2020-08-11 DIAGNOSIS — Z1151 Encounter for screening for human papillomavirus (HPV): Secondary | ICD-10-CM | POA: Insufficient documentation

## 2020-08-11 DIAGNOSIS — R87612 Low grade squamous intraepithelial lesion on cytologic smear of cervix (LGSIL): Secondary | ICD-10-CM | POA: Insufficient documentation

## 2020-08-11 DIAGNOSIS — Z01419 Encounter for gynecological examination (general) (routine) without abnormal findings: Secondary | ICD-10-CM | POA: Insufficient documentation

## 2020-08-11 DIAGNOSIS — Z8619 Personal history of other infectious and parasitic diseases: Secondary | ICD-10-CM | POA: Diagnosis not present

## 2020-08-11 DIAGNOSIS — R87810 Cervical high risk human papillomavirus (HPV) DNA test positive: Secondary | ICD-10-CM | POA: Insufficient documentation

## 2020-08-11 DIAGNOSIS — B977 Papillomavirus as the cause of diseases classified elsewhere: Secondary | ICD-10-CM | POA: Diagnosis not present

## 2020-08-11 DIAGNOSIS — N870 Mild cervical dysplasia: Secondary | ICD-10-CM | POA: Diagnosis not present

## 2020-08-11 DIAGNOSIS — R87613 High grade squamous intraepithelial lesion on cytologic smear of cervix (HGSIL): Secondary | ICD-10-CM | POA: Diagnosis not present

## 2020-08-11 DIAGNOSIS — Z9189 Other specified personal risk factors, not elsewhere classified: Secondary | ICD-10-CM

## 2020-08-11 MED ORDER — ACYCLOVIR 400 MG PO TABS: 400.0000 mg | ORAL_TABLET | ORAL | 4 refills | Status: DC | PRN

## 2020-08-15 LAB — CYTOLOGY - PAP
Comment: NEGATIVE
Diagnosis: HIGH — AB
High risk HPV: POSITIVE — AB

## 2020-08-17 ENCOUNTER — Telehealth: Payer: Self-pay

## 2020-08-17 ENCOUNTER — Telehealth: Payer: Self-pay | Admitting: Obstetrics & Gynecology

## 2020-08-17 DIAGNOSIS — R87613 High grade squamous intraepithelial lesion on cytologic smear of cervix (HGSIL): Secondary | ICD-10-CM

## 2020-08-17 DIAGNOSIS — B977 Papillomavirus as the cause of diseases classified elsewhere: Secondary | ICD-10-CM

## 2020-08-17 NOTE — Telephone Encounter (Signed)
-----   Message from Megan Salon, MD sent at 08/16/2020 12:13 PM EDT ----- Please let pt know pap showed HGSIL.  She needs a repeat colposcopy.    CC:  Royal Hawthorn, CMA

## 2020-08-17 NOTE — Telephone Encounter (Signed)
Call placed to convey benefits for colposcopy. Spoke with the patient and conveyed the benefits. Patient understands/agreeable with the benefits. Patient is aware of the cancellation policy. Appointment scheduled 08/31/20.

## 2020-08-17 NOTE — Telephone Encounter (Signed)
Spoke with pt . Pt given results and recommendations per Dr Sabra Heck. Pt agreeable and verbalized understanding.  Pt scheduled for Colpo on 08/31/20 at 1100 am. Pt verbalized understanding to date and time of appt. Pt advised can take Motrin 800 mg with food and water one hour before procedure. Pt agreeable.  Cc: Alfonse Spruce for precert  Orders placed  Encounter closed

## 2020-08-31 ENCOUNTER — Other Ambulatory Visit: Payer: Self-pay

## 2020-08-31 ENCOUNTER — Ambulatory Visit: Payer: PPO | Admitting: Obstetrics & Gynecology

## 2020-08-31 ENCOUNTER — Other Ambulatory Visit (HOSPITAL_COMMUNITY)
Admission: RE | Admit: 2020-08-31 | Discharge: 2020-08-31 | Disposition: A | Discharge status: Home / Self Care | Payer: PPO | Source: Ambulatory Visit | Attending: Obstetrics & Gynecology | Admitting: Obstetrics & Gynecology

## 2020-08-31 ENCOUNTER — Encounter: Payer: Self-pay | Admitting: Obstetrics & Gynecology

## 2020-08-31 DIAGNOSIS — N879 Dysplasia of cervix uteri, unspecified: Secondary | ICD-10-CM | POA: Diagnosis not present

## 2020-08-31 DIAGNOSIS — B977 Papillomavirus as the cause of diseases classified elsewhere: Secondary | ICD-10-CM | POA: Insufficient documentation

## 2020-08-31 DIAGNOSIS — R87613 High grade squamous intraepithelial lesion on cytologic smear of cervix (HGSIL): Secondary | ICD-10-CM | POA: Diagnosis not present

## 2020-08-31 DIAGNOSIS — D260 Other benign neoplasm of cervix uteri: Secondary | ICD-10-CM | POA: Insufficient documentation

## 2020-09-01 NOTE — Progress Notes (Signed)
64 y.o. Divorced White female G1P1 here for colposcopy with possible biopsies and/or ECC due to HGSIL pap with +HR HPV Pap obtained 08/11/2020.    Prior evaluation/treatment:  LEEP 2018 with low grade changes and possible high grade finding at margins but cautery effect impeded full evaluation.  Follow up pap smears have shown Low grade findings but HR HPV has continued to be positive.  Patient's last menstrual period was 11/25/2001 (exact date).          Sexually active: Yes.    The current method of family planning is post menopausal status.     Patient has been counseled about results and procedure.  Risks and benefits have bene reviewed including immediate and/or delayed bleeding, infection, cervical scaring from procedure, possibility of needing additional follow up as well as treatment.  rare risks of missing a lesion discussed as well.  All questions answered.  Pt ready to proceed.  BP 110/60   Pulse 64   Resp 16   Wt 108 lb (49 kg)   LMP 11/25/2001 (Exact Date)   BMI 21.63 kg/m   Physical Exam Exam conducted with a chaperone present.  Constitutional:      Appearance: Normal appearance.  Genitourinary:    General: Normal vulva.     Vagina: Normal.     Cervix: Normal.  Skin:    General: Skin is warm and dry.  Neurological:     General: No focal deficit present.     Mental Status: She is alert.    Speculum placed.  3% acetic acid applied to cervix for >45 seconds.  Cervix visualized with both 7.5X and 15X magnification.  Green filter also used.  Lugols solution was used.  Findings:  No lesions noted on cervix.  Biopsy:  Not obtained.  ECC:  was performed.  Monsel's was not needed.  Excellent hemostasis was present.  Pt tolerated procedure well and all instruments were removed.    Chaperone, Royal Hawthorn, CMA, was present during procedure.  Assessment:  HGSIL pap with +HR HPV  Plan:  Pathology results will be called to patient and follow-up planned pending results.  We  discussed the need to proceed with LEEP/CKC no matter what the results.  Pt is also ready for hysterectomy but aware LEEP/CKC needs to be complete first.

## 2020-09-05 LAB — SURGICAL PATHOLOGY

## 2020-09-07 ENCOUNTER — Ambulatory Visit: Payer: PPO | Attending: Internal Medicine

## 2020-09-07 DIAGNOSIS — Z23 Encounter for immunization: Secondary | ICD-10-CM

## 2020-09-07 NOTE — Progress Notes (Signed)
   Covid-19 Vaccination Clinic  Name:  ELLAYNA HILLIGOSS    MRN: [id number] DOB: 1955-12-07  09/07/2020  Ms. Chuba was observed post Covid-19 immunization for 15 minutes without incident. She was provided with Vaccine Information Sheet and instruction to access the V-Safe system.   Ms. Viverette was instructed to call 911 with any severe reactions post vaccine: Marland Kitchen Difficulty breathing  . Swelling of face and throat  . A fast heartbeat  . A bad rash all over body  . Dizziness and weakness

## 2020-09-08 ENCOUNTER — Telehealth: Payer: Self-pay

## 2020-09-08 NOTE — Telephone Encounter (Signed)
Patient is calling in regards to questions about Colpo.

## 2020-09-08 NOTE — Telephone Encounter (Signed)
Spoke with pt. Pt states needing to know when her LEEP is scheduled? Pt states has not heard from anyone.  Advised will review with Dr Sabra Heck and return call.   Routing to Dr Sabra Heck.    Megan Salon, MD  09/08/2020 6:01 AM EDT Back to Top    Pt has been notified and LEEP or cold knife conization is being scheduled. Out of current pap recall.

## 2020-09-11 NOTE — Telephone Encounter (Signed)
To [facility] for benefits.

## 2020-09-11 NOTE — Telephone Encounter (Signed)
Called pt personally.  She is aware of ECC results and need to LEEP or Cold knife conization in the OR.  Please proceed with scheduling.  Routing to [facility] for scheduling.

## 2020-09-12 NOTE — Telephone Encounter (Signed)
Call to patient. Per DPR, OK to leave message on voicemail.   Left voicemail requesting a return call to [facility] to review benefits and schedule recommended surgery with M. Edwinna Areola, MD.

## 2020-09-13 ENCOUNTER — Telehealth: Payer: Self-pay

## 2020-09-13 NOTE — Telephone Encounter (Signed)
Routing to [facility] for benefits and surgery scheduling.

## 2020-09-13 NOTE — Telephone Encounter (Signed)
New message   The patient checking on status of surgery date   Please advise

## 2020-09-13 NOTE — Telephone Encounter (Signed)
Jessica Ramsey  Clinical Support Staff    Telephone Encounter  Signed  Creation Time:  09/13/2020 1:13 PM          Signed         New message   The patient checking on status of surgery date   Please advise

## 2020-09-13 NOTE — Telephone Encounter (Signed)
See other phone encounter dated 09/08/20 Will close this encounter.

## 2020-09-13 NOTE — Telephone Encounter (Signed)
Spoke with patient regarding surgery benefits. Patient acknowledges understanding of information presented. Patient is aware that benefits presented are for professional benefits only. Patient is aware that once surgery is scheduled, the hospital will call with separate benefits. Patient is aware of surgery cancellation policy.  Patient is agreeable for surgery next Wednesday, 09/20/2020. Patient aware to expect return call tomorrow from nursing supervisor, [place].

## 2020-09-14 DIAGNOSIS — M25561 Pain in right knee: Secondary | ICD-10-CM | POA: Diagnosis not present

## 2020-09-14 DIAGNOSIS — Z96652 Presence of left artificial knee joint: Secondary | ICD-10-CM | POA: Diagnosis not present

## 2020-09-14 NOTE — Progress Notes (Signed)
Reviewing pt chart for pre-op interview for surgery scheduled for 09-20-2020 with dr Sabra Heck.  Noted pt has aortic valve stenosis. Per echo in epic dated 05-25-2019 pt's aortic valve area is 0.77 cm^2, this is below 1.0 cm^2 anesthesia guidelines for ambulatory surgery center.  Called and left message for OR scheduler for Dr Sabra Heck, Jessica Ramsey, informed her that not candidate for [facility] due to anesthesia guidelines for aortic stenosis valve area less than 1.0 cm^2 and will need to be done at main OR.

## 2020-09-14 NOTE — Telephone Encounter (Signed)
Patient returned call

## 2020-09-14 NOTE — Telephone Encounter (Signed)
CKC/LEEP is scheduled for 09/20/2020 at 12 pm at [facility]. Spoke with patient. Patient wants to ensure getting cortisone shots in her knees today is okay. Advised this will not alter her procedure with Dr.Miller next week. Patient verbalizes understanding. Patient will return call after her appointment to review surgery instructions and to schedule post op appointments.

## 2020-09-14 NOTE — Telephone Encounter (Signed)
Phone call received from PAT nurse at [facility]. Due to patient's aortic stenosis with her valve measuring 0.77 she does not meet anesthesia criteria to have surgery at the surgery center and will need to have surgery at [facility].  Surgery moved to [facility] OR at 1:45 pm on 09/20/2020.

## 2020-09-14 NOTE — Telephone Encounter (Signed)
Spoke with patient. Advised of change to surgery at [facility] at 1330. Patient is agreeable. COVID test scheduled for 09/16/2020 at 10:10 am at [facility] location. Patient is aware of the need to quarantine after test until surgery. 4 week post op scheduled for 10/09/2020 at 11 am with Dr.Miller. Patient is agreeable to date and time. Surgery instructions reviewed and mailed to patient's home address on file.  Routing to provider and will close encounter.

## 2020-09-14 NOTE — Telephone Encounter (Signed)
New message   The patient calling have LEEP procedure on 10.27.21. going to orthopedic appointment this morning  for shot in her knees , has some questions regarding this and her LEEP procedures.    Asking for a call back to discuss

## 2020-09-15 ENCOUNTER — Telehealth: Payer: Self-pay

## 2020-09-15 NOTE — Telephone Encounter (Signed)
Spoke with patient. Patient would like to discuss benefits again before surgery. Advised will have someone return her call Monday to discuss. Patient is agreeable.

## 2020-09-15 NOTE — Telephone Encounter (Signed)
Patient is calling in regards to questions about surgery.

## 2020-09-16 ENCOUNTER — Other Ambulatory Visit (HOSPITAL_COMMUNITY)
Admission: RE | Admit: 2020-09-16 | Discharge: 2020-09-16 | Disposition: A | Discharge status: Home / Self Care | Payer: PPO | Source: Ambulatory Visit | Attending: Obstetrics & Gynecology | Admitting: Obstetrics & Gynecology

## 2020-09-16 DIAGNOSIS — Z01812 Encounter for preprocedural laboratory examination: Secondary | ICD-10-CM | POA: Insufficient documentation

## 2020-09-16 DIAGNOSIS — Z20822 Contact with and (suspected) exposure to covid-19: Secondary | ICD-10-CM | POA: Insufficient documentation

## 2020-09-16 LAB — SARS CORONAVIRUS 2 (TAT 6-24 HRS): SARS Coronavirus 2: NEGATIVE

## 2020-09-19 ENCOUNTER — Other Ambulatory Visit: Payer: Self-pay | Admitting: Obstetrics & Gynecology

## 2020-09-19 ENCOUNTER — Encounter (HOSPITAL_COMMUNITY): Payer: Self-pay | Admitting: Obstetrics & Gynecology

## 2020-09-19 ENCOUNTER — Encounter (HOSPITAL_COMMUNITY): Payer: Self-pay | Admitting: Vascular Surgery

## 2020-09-19 NOTE — Progress Notes (Signed)
Patient denies chest pain or shortness of breath. Reports that she has helped take care of her aging father since COVID test and been around family wearing a mask. She reports that the family she was around is part of her normal circle of family she is with even though they do not live with her. She reports wearing a mask for the duration of contact. Notified her that she made need COVID retest before procedure depending on the anesthesiologist. Patient states she is agreeable to retest if needed.

## 2020-09-19 NOTE — Telephone Encounter (Signed)
Left message to return call to Lonney Revak at [phone] 
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
## 2020-09-20 ENCOUNTER — Ambulatory Visit (HOSPITAL_COMMUNITY): Admission: RE | Admit: 2020-09-20 | Payer: PPO | Source: Home / Self Care | Admitting: Obstetrics & Gynecology

## 2020-09-20 HISTORY — DX: Unspecified displaced fracture of second cervical vertebra, initial encounter for closed fracture: S12.100A

## 2020-09-20 HISTORY — DX: Other specified postprocedural states: R11.2

## 2020-09-20 HISTORY — DX: Cardiac murmur, unspecified: R01.1

## 2020-09-20 HISTORY — DX: Nausea with vomiting, unspecified: R11.2

## 2020-09-20 HISTORY — DX: Other specified postprocedural states: Z98.890

## 2020-09-20 SURGERY — CONE BIOPSY, CERVIX
Anesthesia: Choice

## 2020-09-26 ENCOUNTER — Telehealth: Payer: Self-pay

## 2020-09-26 ENCOUNTER — Telehealth: Payer: Self-pay | Admitting: Cardiology

## 2020-09-26 NOTE — Telephone Encounter (Signed)
   [facility] Pre-operative Risk Assessment    Request for surgical clearance:  1. What type of surgery is being performed? Leep / [facility]ife [facility]one   2. When is this surgery scheduled? TBD    3. What type of clearance is required (medical clearance vs. Pharmacy clearance to hold med vs. Both)? Medical Clearance  4. Are there any medications that need to be held prior to surgery and how long? No   5. Practice name and name of physician performing surgery? [facility]  Dr. Edwinna Areola  6. What is your office phone number?  [phone]   7.   What is your office fax number? [phone]   8.   Anesthesia type (None, local, MAC, general) ? Choice    Jessica Ramsey 09/26/2020, 10:07 AM  _________________________________________________________________

## 2020-09-26 NOTE — Telephone Encounter (Signed)
Spoke with patient. Scheduled patient to see Dr.Turner on 10/10/2020 at 10 am for cardiac clearance for LEEP/CKC. Patient is agreeable to date and time.  Routing to provider and will close encounter.

## 2020-09-27 ENCOUNTER — Telehealth: Payer: Self-pay

## 2020-09-27 NOTE — Telephone Encounter (Signed)
Patient has an upcoming appointment scheduled to see Dr. Radford Pax on 10/10/20. I will add "pre op clearance" to the appointment note.

## 2020-09-27 NOTE — Telephone Encounter (Signed)
Terrah from [facility] called regarding update for patient. Patient needs cardiac clearance and will be seen by Dr. Radford Pax on (10/10/20). Can be reached back at [phone].

## 2020-09-27 NOTE — Telephone Encounter (Signed)
Spoke with patient on 11/2 regarding appointment. Patient is aware and agreeable to date and time. Encounter closed.

## 2020-09-27 NOTE — Telephone Encounter (Signed)
   Primary Cardiologist: Fransico Him, MD  Chart reviewed as part of pre-operative protocol coverage. Because of Jessica Ramsey's past medical history and time since last visit, she will require a follow-up visit in order to better assess preoperative cardiovascular risk.  Pre-op covering staff: - Please schedule appointment and call patient to inform them. If patient already had an upcoming appointment within acceptable timeframe, please add "pre-op clearance" to the appointment notes so provider is aware. - Please contact requesting surgeon's office via preferred method (i.e, phone, fax) to inform them of need for appointment prior to surgery.  If applicable, this message will also be routed to pharmacy pool and/or primary cardiologist for input on holding anticoagulant/antiplatelet agent as requested below so that this information is available to the clearing provider at time of patient's appointment.   [place], [place]  09/27/2020, 10:07 AM

## 2020-09-27 NOTE — Telephone Encounter (Signed)
Called the requesting office and informed them that the patient is scheduled to Dr. Radford Pax on 10/10/20 for cardiac clearance.

## 2020-10-09 ENCOUNTER — Ambulatory Visit: Payer: PPO | Admitting: Obstetrics & Gynecology

## 2020-10-10 ENCOUNTER — Encounter: Payer: Self-pay | Admitting: Cardiology

## 2020-10-10 ENCOUNTER — Other Ambulatory Visit: Payer: Self-pay

## 2020-10-10 ENCOUNTER — Ambulatory Visit: Payer: PPO | Admitting: Cardiology

## 2020-10-10 VITALS — BP 122/76 | HR 80 | Ht 59.25 in | Wt 109.8 lb

## 2020-10-10 DIAGNOSIS — I351 Nonrheumatic aortic (valve) insufficiency: Secondary | ICD-10-CM | POA: Diagnosis not present

## 2020-10-10 DIAGNOSIS — I350 Nonrheumatic aortic (valve) stenosis: Secondary | ICD-10-CM | POA: Diagnosis not present

## 2020-10-10 DIAGNOSIS — I10 Essential (primary) hypertension: Secondary | ICD-10-CM | POA: Diagnosis not present

## 2020-10-10 NOTE — Patient Instructions (Signed)

## 2020-10-10 NOTE — Addendum Note (Signed)
Addended by: Antonieta Iba on: 10/10/2020 10:26 AM   Modules accepted: Orders
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
## 2020-10-17 ENCOUNTER — Ambulatory Visit: Payer: PPO | Admitting: Obstetrics & Gynecology

## 2020-10-21 DIAGNOSIS — Z20822 Contact with and (suspected) exposure to covid-19: Secondary | ICD-10-CM | POA: Diagnosis not present

## 2020-10-23 DIAGNOSIS — U071 COVID-19: Secondary | ICD-10-CM | POA: Diagnosis not present

## 2020-10-23 DIAGNOSIS — Z20822 Contact with and (suspected) exposure to covid-19: Secondary | ICD-10-CM | POA: Diagnosis not present

## 2020-10-30 DIAGNOSIS — Z20822 Contact with and (suspected) exposure to covid-19: Secondary | ICD-10-CM | POA: Diagnosis not present

## 2020-11-07 ENCOUNTER — Encounter (HOSPITAL_COMMUNITY): Payer: Self-pay | Admitting: Cardiology

## 2020-11-07 ENCOUNTER — Other Ambulatory Visit (HOSPITAL_COMMUNITY): Payer: PPO

## 2020-11-10 ENCOUNTER — Ambulatory Visit (HOSPITAL_COMMUNITY): Payer: PPO | Attending: Internal Medicine

## 2020-11-10 ENCOUNTER — Other Ambulatory Visit: Payer: Self-pay

## 2020-11-10 DIAGNOSIS — I350 Nonrheumatic aortic (valve) stenosis: Secondary | ICD-10-CM | POA: Diagnosis not present

## 2020-11-10 DIAGNOSIS — I351 Nonrheumatic aortic (valve) insufficiency: Secondary | ICD-10-CM | POA: Diagnosis not present

## 2020-11-10 LAB — ECHOCARDIOGRAM COMPLETE
AR max vel: 1.18 cm2
AV Area VTI: 1.23 cm2
AV Area mean vel: 1.14 cm2
AV Mean grad: 31 mmHg
AV Peak grad: 51.8 mmHg
Ao pk vel: 3.6 m/s
Area-P 1/2: 4.31 cm2
P 1/2 time: 421 msec
S' Lateral: 2.5 cm

## 2020-11-16 ENCOUNTER — Other Ambulatory Visit (HOSPITAL_COMMUNITY): Payer: PPO

## 2020-11-22 DIAGNOSIS — M5441 Lumbago with sciatica, right side: Secondary | ICD-10-CM | POA: Diagnosis not present

## 2020-11-22 DIAGNOSIS — G4700 Insomnia, unspecified: Secondary | ICD-10-CM | POA: Diagnosis not present

## 2020-11-25 HISTORY — PX: SKIN SURGERY: SHX2413

## 2020-11-30 DIAGNOSIS — M5441 Lumbago with sciatica, right side: Secondary | ICD-10-CM | POA: Diagnosis not present

## 2020-12-18 DIAGNOSIS — U071 COVID-19: Secondary | ICD-10-CM | POA: Diagnosis not present

## 2020-12-18 DIAGNOSIS — L309 Dermatitis, unspecified: Secondary | ICD-10-CM | POA: Diagnosis not present

## 2020-12-18 HISTORY — DX: COVID-19: U07.1

## 2020-12-19 DIAGNOSIS — M5459 Other low back pain: Secondary | ICD-10-CM | POA: Diagnosis not present

## 2020-12-19 DIAGNOSIS — M25551 Pain in right hip: Secondary | ICD-10-CM | POA: Diagnosis not present

## 2020-12-22 ENCOUNTER — Other Ambulatory Visit (HOSPITAL_COMMUNITY): Payer: Self-pay | Admitting: Physical Medicine and Rehabilitation

## 2020-12-22 DIAGNOSIS — Z1231 Encounter for screening mammogram for malignant neoplasm of breast: Secondary | ICD-10-CM

## 2020-12-29 ENCOUNTER — Telehealth: Payer: Self-pay | Admitting: Obstetrics & Gynecology

## 2020-12-29 NOTE — Telephone Encounter (Signed)
mil

## 2021-01-01 ENCOUNTER — Other Ambulatory Visit: Payer: Self-pay

## 2021-01-01 ENCOUNTER — Encounter: Payer: Self-pay | Admitting: Neurology

## 2021-01-01 ENCOUNTER — Ambulatory Visit: Payer: PPO | Admitting: Neurology

## 2021-01-01 DIAGNOSIS — M47812 Spondylosis without myelopathy or radiculopathy, cervical region: Secondary | ICD-10-CM
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
## 2021-01-01 NOTE — Progress Notes (Signed)
I have read the note, and I agree with the clinical assessment and plan.  Kindell Strada K Cailean Heacock   

## 2021-01-01 NOTE — Patient Instructions (Signed)
Continue seeing your primary doctor Call for any worsening problems See you back as needed

## 2021-01-05 ENCOUNTER — Ambulatory Visit (HOSPITAL_COMMUNITY)
Admission: RE | Admit: 2021-01-05 | Discharge: 2021-01-05 | Disposition: A | Discharge status: Home / Self Care | Payer: PPO | Source: Ambulatory Visit | Attending: Physical Medicine and Rehabilitation | Admitting: Physical Medicine and Rehabilitation

## 2021-01-05 ENCOUNTER — Other Ambulatory Visit: Payer: Self-pay

## 2021-01-05 DIAGNOSIS — Z1231 Encounter for screening mammogram for malignant neoplasm of breast: Secondary | ICD-10-CM | POA: Insufficient documentation

## 2021-01-08 ENCOUNTER — Telehealth (HOSPITAL_BASED_OUTPATIENT_CLINIC_OR_DEPARTMENT_OTHER): Payer: Self-pay | Admitting: Obstetrics & Gynecology

## 2021-01-15 DIAGNOSIS — Z85828 Personal history of other malignant neoplasm of skin: Secondary | ICD-10-CM | POA: Diagnosis not present

## 2021-01-15 DIAGNOSIS — B078 Other viral warts: Secondary | ICD-10-CM | POA: Diagnosis not present

## 2021-01-15 DIAGNOSIS — L821 Other seborrheic keratosis: Secondary | ICD-10-CM | POA: Diagnosis not present

## 2021-01-15 DIAGNOSIS — D1801 Hemangioma of skin and subcutaneous tissue: Secondary | ICD-10-CM | POA: Diagnosis not present

## 2021-01-15 DIAGNOSIS — L570 Actinic keratosis: Secondary | ICD-10-CM | POA: Diagnosis not present

## 2021-01-15 DIAGNOSIS — D229 Melanocytic nevi, unspecified: Secondary | ICD-10-CM | POA: Diagnosis not present

## 2021-01-15 DIAGNOSIS — L905 Scar conditions and fibrosis of skin: Secondary | ICD-10-CM | POA: Diagnosis not present

## 2021-01-18 DIAGNOSIS — G4700 Insomnia, unspecified: Secondary | ICD-10-CM | POA: Diagnosis not present

## 2021-01-18 DIAGNOSIS — I10 Essential (primary) hypertension: Secondary | ICD-10-CM | POA: Diagnosis not present

## 2021-01-18 DIAGNOSIS — M797 Fibromyalgia: Secondary | ICD-10-CM | POA: Diagnosis not present

## 2021-01-18 DIAGNOSIS — I350 Nonrheumatic aortic (valve) stenosis: Secondary | ICD-10-CM | POA: Diagnosis not present

## 2021-01-18 DIAGNOSIS — K219 Gastro-esophageal reflux disease without esophagitis: Secondary | ICD-10-CM | POA: Diagnosis not present

## 2021-01-18 DIAGNOSIS — M5441 Lumbago with sciatica, right side: Secondary | ICD-10-CM | POA: Diagnosis not present

## 2021-01-18 DIAGNOSIS — M47816 Spondylosis without myelopathy or radiculopathy, lumbar region: Secondary | ICD-10-CM | POA: Diagnosis not present

## 2021-01-18 DIAGNOSIS — K2900 Acute gastritis without bleeding: Secondary | ICD-10-CM | POA: Diagnosis not present

## 2021-01-18 DIAGNOSIS — Z0000 Encounter for general adult medical examination without abnormal findings: Secondary | ICD-10-CM | POA: Diagnosis not present

## 2021-01-23 NOTE — Telephone Encounter (Signed)
Patient has been schedule with Dr.Miller.

## 2021-01-24 ENCOUNTER — Other Ambulatory Visit: Payer: Self-pay

## 2021-01-24 ENCOUNTER — Encounter (HOSPITAL_BASED_OUTPATIENT_CLINIC_OR_DEPARTMENT_OTHER): Payer: Self-pay | Admitting: Obstetrics & Gynecology

## 2021-01-24 ENCOUNTER — Ambulatory Visit (HOSPITAL_BASED_OUTPATIENT_CLINIC_OR_DEPARTMENT_OTHER): Payer: PPO | Admitting: Obstetrics & Gynecology

## 2021-01-24 VITALS — BP 137/71 | HR 72 | Ht 59.5 in | Wt 108.0 lb

## 2021-01-24 DIAGNOSIS — I350 Nonrheumatic aortic (valve) stenosis: Secondary | ICD-10-CM

## 2021-01-24 DIAGNOSIS — M797 Fibromyalgia: Secondary | ICD-10-CM

## 2021-01-24 DIAGNOSIS — B009 Herpesviral infection, unspecified: Secondary | ICD-10-CM | POA: Diagnosis not present

## 2021-01-24 DIAGNOSIS — N870 Mild cervical dysplasia: Secondary | ICD-10-CM

## 2021-01-24 DIAGNOSIS — R87613 High grade squamous intraepithelial lesion on cytologic smear of cervix (HGSIL): Secondary | ICD-10-CM

## 2021-01-24 DIAGNOSIS — I10 Essential (primary) hypertension: Secondary | ICD-10-CM
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
## 2021-02-02 ENCOUNTER — Encounter (HOSPITAL_BASED_OUTPATIENT_CLINIC_OR_DEPARTMENT_OTHER): Payer: Self-pay

## 2021-02-08 ENCOUNTER — Encounter (HOSPITAL_BASED_OUTPATIENT_CLINIC_OR_DEPARTMENT_OTHER): Payer: Self-pay | Admitting: Obstetrics & Gynecology

## 2021-02-08 ENCOUNTER — Telehealth (HOSPITAL_BASED_OUTPATIENT_CLINIC_OR_DEPARTMENT_OTHER): Payer: Self-pay | Admitting: Obstetrics & Gynecology

## 2021-02-08 NOTE — Progress Notes (Signed)
Spoke with dr Gerald Stabs moser mda and Jaquelyn Bitter oddono mda and made aware 05-15-2019 echo results and 11-10-2020 echo results and has cardiac clearance dated 10-10-2020 dr Tressia Miners turner for surgery, pt ok for [facility] and to use 10-10-2020 cardiac clearance note barring any acte status changes per dr Jaquelyn Bitter oddono mda and chris moser mda.

## 2021-02-08 NOTE — Telephone Encounter (Signed)
Patient called and said someone call from [facility]  surgery for 3 /23/2022 and  she wasn't aware of it and she has a injection for 02/13/2021 and do not want rescheduled.She wasn't sure if she can get her  injection and still have surgery and no one schedule with her but for her.

## 2021-02-09 NOTE — Telephone Encounter (Signed)
Call to patient. Has spinal injection 02-13-21. Has waited for this appointment and has ben uncomfortable. Concerned about steroids and affect on healing. Requests to reschedule Conization.   She will speak to Dr Nelva Bush regarding recovery and reschedule date and call back.   Case canceled for 02-14-21.

## 2021-02-09 NOTE — Telephone Encounter (Signed)
Could you see when we can reschedule this for the patient?

## 2021-02-13 DIAGNOSIS — M5416 Radiculopathy, lumbar region: Secondary | ICD-10-CM | POA: Diagnosis not present

## 2021-02-14 ENCOUNTER — Telehealth: Payer: Self-pay | Admitting: *Deleted

## 2021-02-14 ENCOUNTER — Encounter (HOSPITAL_BASED_OUTPATIENT_CLINIC_OR_DEPARTMENT_OTHER): Payer: Self-pay | Admitting: *Deleted

## 2021-02-14 NOTE — Telephone Encounter (Signed)
All from patient. States she already has pain improvement from spinal injection. Desires to proceed with scheduling conization without date restriction. Advised will contact her once rescheduled. Patient states to leave voice mail message.

## 2021-02-14 NOTE — Telephone Encounter (Signed)
Patient has called office and desires to proceed with rescheduling surgery.  Return call. Left message to call surgery scheduling direct @ [phone] or can leave message with [facility] office.

## 2021-02-14 NOTE — Telephone Encounter (Signed)
Call to patient. Voice mail has name confirmation.  Left message advising Cervical Conization is rescheduled for Friday, 02-23-21 at 0730 at [facility]. Advised to plan 0530 arrival and will receive call from surgery center.   Left message to call back if additional questions.   Encounter closed.

## 2021-02-16 ENCOUNTER — Telehealth: Payer: Self-pay | Admitting: *Deleted

## 2021-02-16 NOTE — Telephone Encounter (Signed)
Pt has surgery next Friday 02/23/21 and wants to know if she has to stop meloxicam.  She previously has been advised that she has to stop it due to it being a blood thinner but she states that she has a significant decrease in quality of life due to increase pain when off even for just a few days.  She would like your recommendation.  Please advise Sharrie Rothman CMA

## 2021-02-19 NOTE — Telephone Encounter (Signed)
She does not have to stop her meloxicam prior to this procedure.  Thanks.

## 2021-02-19 NOTE — Telephone Encounter (Signed)
Attempt made to contact Jessica Ramsey is a 65 y.o. female re: message from Dr. Sabra Heck below.  Pt was not available.  LM on the VM for the patient to call me back.

## 2021-02-20 ENCOUNTER — Other Ambulatory Visit: Payer: Self-pay

## 2021-02-20 ENCOUNTER — Encounter (HOSPITAL_BASED_OUTPATIENT_CLINIC_OR_DEPARTMENT_OTHER): Payer: Self-pay | Admitting: Obstetrics & Gynecology

## 2021-02-20 NOTE — Progress Notes (Addendum)
Spoke w/ via phone for pre-op interview---pt  Lab needs dos----     I stat            Lab results------see below COVID test ------positve covid 12-18-2020 care everywhere no retest needed Arrive at -------530 am 02-23-2021 NPO after MN NO Solid Food.  Clear liquids from MN until---430 am then npo Med rec completed Medications to take morning of surgery -----eye drop, cymbalta, montelukast, omeprazole Diabetic medication -----n/a Patient instructed to bring photo id and insurance card day of surgery Patient aware to have Driver (ride ) / caregiver  Daughter carrie will drop pt off   for 24 hours after surgery  Patient Special Instructions -----none Pre-Op special Istructions -----none Patient verbalized understanding of instructions that were given at this phone interview. Patient denies shortness of breath, chest pain, fever, cough at this phone interview.  Anesthesia : see 02-08-2021 progress note Lonald Troiani rn 701 pm chart and patient history reviewed with dr Laurie Panda and dr Jaquelyn Bitter oddono ok to use 10-10-2020 cardiac clearance dr Tressia Miners turner and pt ok for [facility]  barring any acute status changes.  BWI:[id number] [place] Cardiologist : dr Tressia Miners turner cardiac clearance 10-10-2020 on chart/epic Chest x-ray :none EKG :10-10-2020 epic Echo :11-10-2020 chart/epic Stress test:none Cardiac Cath : none Activity level: clesns own house can climb flight of steps without problems does onw housework Sleep Study/ CPAP :none Fasting Blood Sugar :      / Checks Blood Sugar -- times a day:  n/a Blood Thinner/ Instructions /Last Dose:n/a ASA / Instructions/ Last Dose : n/a

## 2021-02-21 ENCOUNTER — Encounter (HOSPITAL_BASED_OUTPATIENT_CLINIC_OR_DEPARTMENT_OTHER): Payer: Self-pay | Admitting: Obstetrics & Gynecology

## 2021-02-22 ENCOUNTER — Other Ambulatory Visit (HOSPITAL_BASED_OUTPATIENT_CLINIC_OR_DEPARTMENT_OTHER): Payer: Self-pay | Admitting: Obstetrics & Gynecology

## 2021-02-22 ENCOUNTER — Telehealth: Payer: Self-pay | Admitting: *Deleted

## 2021-02-22 ENCOUNTER — Encounter (HOSPITAL_BASED_OUTPATIENT_CLINIC_OR_DEPARTMENT_OTHER): Payer: Self-pay | Admitting: *Deleted

## 2021-02-22 NOTE — Telephone Encounter (Signed)
Call to patient. Voice mail has name confirmation. Left message advising surgery date change to 02-28-21 at 1300 at [facility]. Arrive 1100.  Left message will receive letter confirmation through My Chart.   Encounter closed.

## 2021-02-22 NOTE — Progress Notes (Signed)
Spoke w/ via phone for pre-op interview---pt  Lab needs dos----     I stat            Lab results------see below COVID test ------positve covid 12-18-2020 care everywhere no retest needed Arrive at ------- 02-28-2021 NPO after MN NO Solid Food.  Clear liquids from MN until---1100  am then npo Med rec completed Medications to take morning of surgery -----eye drop, cymbalta, montelukast, omeprazole Diabetic medication -----n/a Patient instructed to bring photo id and insurance card day of surgery Patient aware to have Driver (ride ) / caregiver  Daughter carrie will drop pt off   for 24 hours after surgery  Patient Special Instructions -----Spoke with patient by phone pt aware surgery dated changed to 02-28-2021 Pre-Op special Istructions -----none Patient verbalized understanding of instructions that were given at this phone interview. Patient denies shortness of breath, chest pain, fever, cough at this phone interview.  Anesthesia : see 02-08-2021 progress note Effa Yarrow rn 701 pm chart and patient history reviewed with dr Laurie Panda and dr Jaquelyn Bitter oddono ok to use 10-10-2020 cardiac clearance dr Tressia Miners turner and pt ok for [facility]  barring any acute status changes.  QRF:[id number] [place] Cardiologist : dr Tressia Miners turner cardiac clearance 10-10-2020 on chart/epic Chest x-ray :none EKG :10-10-2020 epic Echo :11-10-2020 chart/epic Stress test:none Cardiac Cath : none Activity level: clesns own house can climb flight of steps without problems does onw housework Sleep Study/ CPAP :none Fasting Blood Sugar :      / Checks Blood Sugar -- times a day:  n/a Blood Thinner/ Instructions /Last Dose:n/a ASA / Instructions/ Last Dose : n/a

## 2021-02-27 ENCOUNTER — Telehealth: Payer: Self-pay | Admitting: *Deleted

## 2021-02-27 NOTE — Telephone Encounter (Signed)
Call to patient. Advised of surgery time change to 0945 and arrive 0745.  Reviewed NPO after midnight.   Encounter closed.

## 2021-02-28 ENCOUNTER — Encounter (HOSPITAL_BASED_OUTPATIENT_CLINIC_OR_DEPARTMENT_OTHER)
Admission: RE | Disposition: A | Discharge status: Home / Self Care | Payer: Self-pay | Source: Home / Self Care | Attending: Obstetrics & Gynecology

## 2021-02-28 ENCOUNTER — Ambulatory Visit (HOSPITAL_BASED_OUTPATIENT_CLINIC_OR_DEPARTMENT_OTHER)
Admission: RE | Admit: 2021-02-28 | Discharge: 2021-02-28 | Disposition: A | Discharge status: Home / Self Care | Payer: PPO | Attending: Obstetrics & Gynecology | Admitting: Obstetrics & Gynecology

## 2021-02-28 ENCOUNTER — Encounter (HOSPITAL_BASED_OUTPATIENT_CLINIC_OR_DEPARTMENT_OTHER): Payer: Self-pay | Admitting: Obstetrics & Gynecology

## 2021-02-28 ENCOUNTER — Ambulatory Visit (HOSPITAL_BASED_OUTPATIENT_CLINIC_OR_DEPARTMENT_OTHER): Payer: PPO | Admitting: Anesthesiology

## 2021-02-28 DIAGNOSIS — E039 Hypothyroidism, unspecified: Secondary | ICD-10-CM | POA: Diagnosis not present

## 2021-02-28 DIAGNOSIS — R87613 High grade squamous intraepithelial lesion on cytologic smear of cervix (HGSIL): Secondary | ICD-10-CM | POA: Diagnosis not present

## 2021-02-28 DIAGNOSIS — Z791 Long term (current) use of non-steroidal anti-inflammatories (NSAID): Secondary | ICD-10-CM | POA: Insufficient documentation

## 2021-02-28 DIAGNOSIS — Z79899 Other long term (current) drug therapy: Secondary | ICD-10-CM | POA: Insufficient documentation

## 2021-02-28 DIAGNOSIS — Z8616 Personal history of COVID-19: Secondary | ICD-10-CM | POA: Insufficient documentation

## 2021-02-28 DIAGNOSIS — N870 Mild cervical dysplasia: Secondary | ICD-10-CM

## 2021-02-28 DIAGNOSIS — Z880 Allergy status to penicillin: Secondary | ICD-10-CM | POA: Diagnosis not present

## 2021-02-28 DIAGNOSIS — Z885 Allergy status to narcotic agent status: Secondary | ICD-10-CM | POA: Insufficient documentation

## 2021-02-28 DIAGNOSIS — Z881 Allergy status to other antibiotic agents status: Secondary | ICD-10-CM | POA: Diagnosis not present

## 2021-02-28 DIAGNOSIS — F418 Other specified anxiety disorders: Secondary | ICD-10-CM | POA: Diagnosis not present

## 2021-02-28 DIAGNOSIS — Z87891 Personal history of nicotine dependence: Secondary | ICD-10-CM | POA: Insufficient documentation

## 2021-02-28 DIAGNOSIS — I10 Essential (primary) hypertension: Secondary | ICD-10-CM | POA: Diagnosis not present

## 2021-02-28 DIAGNOSIS — D069 Carcinoma in situ of cervix, unspecified: Secondary | ICD-10-CM

## 2021-02-28 HISTORY — DX: Family history of other specified conditions: Z84.89

## 2021-02-28 HISTORY — DX: Carpal tunnel syndrome, right upper limb: G56.01

## 2021-02-28 HISTORY — DX: Presence of dental prosthetic device (complete) (partial): Z97.2

## 2021-02-28 HISTORY — PX: COLPOSCOPY: SHX161

## 2021-02-28 HISTORY — DX: Frequency of micturition: R35.0

## 2021-02-28 HISTORY — PX: BIOPSY: SHX5522

## 2021-02-28 HISTORY — DX: Carcinoma in situ of cervix, unspecified: D06.9

## 2021-02-28 HISTORY — DX: Presence of spectacles and contact lenses: Z97.3

## 2021-02-28 LAB — POCT I-STAT, CHEM 8
BUN: 15 mg/dL (ref 8–23)
Calcium, Ion: 1.29 mmol/L (ref 1.15–1.40)
Chloride: 102 mmol/L (ref 98–111)
Creatinine, Ser: 0.8 mg/dL (ref 0.44–1.00)
Glucose, Bld: 97 mg/dL (ref 70–99)
HCT: 39 % (ref 36.0–46.0)
Hemoglobin: 13.3 g/dL (ref 12.0–15.0)
Potassium: 4.1 mmol/L (ref 3.5–5.1)
Sodium: 140 mmol/L (ref 135–145)
TCO2: 26 mmol/L (ref 22–32)

## 2021-02-28 SURGERY — COLPOSCOPY
Anesthesia: General | Site: Cervix

## 2021-02-28 MED ORDER — ACETAMINOPHEN 500 MG PO TABS
ORAL_TABLET | ORAL | Status: AC
  Filled 2021-02-28: qty 2

## 2021-02-28 MED ORDER — PHENYLEPHRINE HCL (PRESSORS) 10 MG/ML IV SOLN
INTRAVENOUS | Status: AC
  Filled 2021-02-28: qty 1

## 2021-02-28 MED ORDER — FENTANYL CITRATE (PF) 100 MCG/2ML IJ SOLN
INTRAMUSCULAR | Status: DC | PRN
  Administered 2021-02-28 (×2): 50 ug via INTRAVENOUS

## 2021-02-28 MED ORDER — PROPOFOL 10 MG/ML IV BOLUS
INTRAVENOUS | Status: DC | PRN
  Administered 2021-02-28: 150 mg via INTRAVENOUS

## 2021-02-28 MED ORDER — LIDOCAINE-EPINEPHRINE 1 %-1:100000 IJ SOLN
INTRAMUSCULAR | Status: DC | PRN
  Administered 2021-02-28: 6 mL

## 2021-02-28 MED ORDER — KETOROLAC TROMETHAMINE 30 MG/ML IJ SOLN
INTRAMUSCULAR | Status: DC | PRN
  Administered 2021-02-28: 30 mg via INTRAVENOUS

## 2021-02-28 MED ORDER — ONDANSETRON HCL 4 MG/2ML IJ SOLN
INTRAMUSCULAR | Status: DC | PRN
  Administered 2021-02-28: 4 mg via INTRAVENOUS

## 2021-02-28 MED ORDER — PHENYLEPHRINE HCL-NACL 10-0.9 MG/250ML-% IV SOLN
INTRAVENOUS | Status: DC | PRN
  Administered 2021-02-28: 80 ug/min via INTRAVENOUS

## 2021-02-28 MED ORDER — LACTATED RINGERS IV SOLN: INTRAVENOUS | Status: DC

## 2021-02-28 MED ORDER — ACETAMINOPHEN 500 MG PO TABS
1000.0000 mg | ORAL_TABLET | Freq: Once | ORAL | Status: AC
  Administered 2021-02-28: 1000 mg via ORAL

## 2021-02-28 MED ORDER — MIDAZOLAM HCL 2 MG/2ML IJ SOLN
INTRAMUSCULAR | Status: AC
  Filled 2021-02-28: qty 2

## 2021-02-28 MED ORDER — ACETIC ACID 4% SOLUTION
Status: DC | PRN
  Administered 2021-02-28: 1 via TOPICAL

## 2021-02-28 MED ORDER — MIDAZOLAM HCL 5 MG/5ML IJ SOLN
INTRAMUSCULAR | Status: DC | PRN
  Administered 2021-02-28: 1 mg via INTRAVENOUS

## 2021-02-28 MED ORDER — LIDOCAINE 2% (20 MG/ML) 5 ML SYRINGE
INTRAMUSCULAR | Status: DC | PRN
  Administered 2021-02-28: 30 mg via INTRAVENOUS

## 2021-02-28 MED ORDER — FERRIC SUBSULFATE SOLN
Status: DC | PRN
  Administered 2021-02-28: 1

## 2021-02-28 MED ORDER — FENTANYL CITRATE (PF) 100 MCG/2ML IJ SOLN
INTRAMUSCULAR | Status: AC
  Filled 2021-02-28: qty 2

## 2021-02-28 MED ORDER — DEXAMETHASONE SODIUM PHOSPHATE 10 MG/ML IJ SOLN
INTRAMUSCULAR | Status: AC
  Filled 2021-02-28: qty 1

## 2021-02-28 MED ORDER — MEPERIDINE HCL 25 MG/ML IJ SOLN: 6.2500 mg | INTRAMUSCULAR | Status: DC | PRN

## 2021-02-28 MED ORDER — POVIDONE-IODINE 10 % EX SWAB: 2.0000 "application " | Freq: Once | CUTANEOUS | Status: DC

## 2021-02-28 MED ORDER — KETOROLAC TROMETHAMINE 30 MG/ML IJ SOLN
INTRAMUSCULAR | Status: AC
  Filled 2021-02-28: qty 1

## 2021-02-28 MED ORDER — ONDANSETRON HCL 4 MG/2ML IJ SOLN
INTRAMUSCULAR | Status: AC
  Filled 2021-02-28: qty 2

## 2021-02-28 MED ORDER — PROMETHAZINE HCL 25 MG/ML IJ SOLN: 6.2500 mg | INTRAMUSCULAR | Status: DC | PRN

## 2021-02-28 MED ORDER — DEXAMETHASONE SODIUM PHOSPHATE 10 MG/ML IJ SOLN
INTRAMUSCULAR | Status: DC | PRN
  Administered 2021-02-28: 10 mg via INTRAVENOUS

## 2021-02-28 MED ORDER — OXYCODONE HCL 5 MG PO TABS: 5.0000 mg | ORAL_TABLET | Freq: Once | ORAL | Status: DC | PRN

## 2021-02-28 MED ORDER — OXYCODONE HCL 5 MG/5ML PO SOLN: 5.0000 mg | Freq: Once | ORAL | Status: DC | PRN

## 2021-02-28 MED ORDER — PROPOFOL 10 MG/ML IV BOLUS
INTRAVENOUS | Status: AC
  Filled 2021-02-28: qty 20

## 2021-02-28 MED ORDER — LIDOCAINE 2% (20 MG/ML) 5 ML SYRINGE
INTRAMUSCULAR | Status: AC
  Filled 2021-02-28: qty 5

## 2021-02-28 MED ORDER — MIDAZOLAM HCL 2 MG/2ML IJ SOLN: 0.5000 mg | Freq: Once | INTRAMUSCULAR | Status: DC | PRN

## 2021-02-28 MED ORDER — FENTANYL CITRATE (PF) 100 MCG/2ML IJ SOLN: 25.0000 ug | INTRAMUSCULAR | Status: DC | PRN

## 2021-02-28 MED ORDER — PHENYLEPHRINE 40 MCG/ML (10ML) SYRINGE FOR IV PUSH (FOR BLOOD PRESSURE SUPPORT)
PREFILLED_SYRINGE | INTRAVENOUS | Status: AC
  Filled 2021-02-28: qty 10

## 2021-02-28 SURGICAL SUPPLY — 29 items
APL SWBSTK 6 STRL LF DISP (MISCELLANEOUS)
APPLICATOR COTTON TIP 6 STRL (MISCELLANEOUS) IMPLANT
APPLICATOR COTTON TIP 6IN STRL (MISCELLANEOUS)
BLADE SURG 11 STRL SS (BLADE) ×3 IMPLANT
DILATOR CANAL MILEX (MISCELLANEOUS) ×3 IMPLANT
ELECT BALL LEEP 3MM BLK (ELECTRODE) ×3 IMPLANT
ELECT BALL LEEP 5MM RED (ELECTRODE) IMPLANT
ELECT LOOP LEEP RND 10X10 YLW (CUTTING LOOP)
ELECT REM PT RETURN 9FT ADLT (ELECTROSURGICAL) ×3
ELECTRODE LOOP LP RND 10X10YLW (CUTTING LOOP) IMPLANT
ELECTRODE REM PT RTRN 9FT ADLT (ELECTROSURGICAL) ×2 IMPLANT
GLOVE SURG ENC MOIS LTX SZ6.5 (GLOVE) ×3 IMPLANT
GLOVE SURG LTX SZ6.5 (GLOVE) ×6 IMPLANT
GLOVE SURG UNDER POLY LF SZ7 (GLOVE) ×9 IMPLANT
GOWN STRL REUS W/ TWL LRG LVL3 (GOWN DISPOSABLE) IMPLANT
GOWN STRL REUS W/TWL LRG LVL3 (GOWN DISPOSABLE) ×3 IMPLANT
GOWN STRL REUS W/TWL XL LVL3 (GOWN DISPOSABLE) ×3 IMPLANT
PACK VAGINAL MINOR WOMEN LF (CUSTOM PROCEDURE TRAY) ×3 IMPLANT
PAD OB MATERNITY 4.3X12.25 (PERSONAL CARE ITEMS) ×3 IMPLANT
PENCIL BUTTON HOLSTER BLD 10FT (ELECTRODE) IMPLANT
PENCIL SMOKE EVACUATOR (MISCELLANEOUS) ×3 IMPLANT
SCOPETTES 8  STERILE (MISCELLANEOUS) ×3
SCOPETTES 8 STERILE (MISCELLANEOUS) ×2 IMPLANT
SPONGE SURGIFOAM ABS GEL 12-7 (HEMOSTASIS) IMPLANT
SUT SILK 2 0 30  PSL (SUTURE) ×3
SUT SILK 2 0 30 PSL (SUTURE) ×2 IMPLANT
SUT VIC AB 0 CT1 27 (SUTURE) ×6
SUT VIC AB 0 CT1 27XBRD ANBCTR (SUTURE) ×4 IMPLANT
TOWEL OR 17X26 10 PK STRL BLUE (TOWEL DISPOSABLE) ×3 IMPLANT

## 2021-02-28 NOTE — Anesthesia Preprocedure Evaluation (Addendum)
Anesthesia Evaluation  Patient identified by MRN, date of birth, ID band Patient awake    Reviewed: Allergy & Precautions, NPO status , Patient's Chart, lab work & pertinent test results  History of Anesthesia Complications (+) PONV  Airway Mallampati: I  TM Distance: >3 FB Neck ROM: Full    Dental  (+) Lower Dentures, Partial Upper, Dental Advisory Given   Pulmonary COPD,  COPD inhaler, former smoker,  12/18/2020 COVID positive   breath sounds clear to auscultation       Cardiovascular hypertension, Pt. on medications (-) angina+ Valvular Problems/Murmurs (moderate with mean grad 31 mmHg) AS  Rhythm:Regular Rate:Normal + Systolic murmurs 35/3614 ECHO: EF 66%, mod calcification of aortic valve with mod AS, mean grad 31 mmHg   Neuro/Psych Anxiety Depression CVA, No Residual Symptoms    GI/Hepatic Neg liver ROS, GERD  Medicated and Controlled,  Endo/Other  Hypothyroidism   Renal/GU negative Renal ROS     Musculoskeletal  (+) Fibromyalgia -  Abdominal   Peds  Hematology negative hematology ROS (+)   Anesthesia Other Findings   Reproductive/Obstetrics                           Anesthesia Physical Anesthesia Plan  ASA: III  Anesthesia Plan: General   Post-op Pain Management:    Induction: Intravenous  PONV Risk Score and Plan: 4 or greater and Ondansetron, Dexamethasone and Treatment may vary due to age or medical condition  Airway Management Planned: LMA  Additional Equipment: None  Intra-op Plan:   Post-operative Plan:   Informed Consent: I have reviewed the patients History and Physical, chart, labs and discussed the procedure including the risks, benefits and alternatives for the proposed anesthesia with the patient or authorized representative who has indicated his/her understanding and acceptance.     Dental advisory given  Plan Discussed with: CRNA and  Surgeon  Anesthesia Plan Comments:        Anesthesia Quick Evaluation

## 2021-02-28 NOTE — Anesthesia Procedure Notes (Addendum)
Procedure Name: LMA Insertion Date/Time: 02/28/2021 9:41 AM Performed by: Rogers Blocker, CRNA Pre-anesthesia Checklist: Patient identified, Emergency Drugs available, Suction available and Patient being monitored Patient Re-evaluated:Patient Re-evaluated prior to induction Oxygen Delivery Method: Circle System Utilized Preoxygenation: Pre-oxygenation with 100% oxygen Induction Type: IV induction Ventilation: Mask ventilation without difficulty LMA: LMA inserted LMA Size: 3.0 Number of attempts: 1 Placement Confirmation: positive ETCO2 Tube secured with: Tape Dental Injury: Teeth and Oropharynx as per pre-operative assessment

## 2021-02-28 NOTE — Discharge Instructions (Addendum)
Post-surgical Instructions, Outpatient Surgery  You may expect to feel dizzy, weak, and drowsy for as long as 24 hours after receiving the medicine that made you sleep (anesthetic). For the first 24 hours after your surgery:    Do not drive a car, ride a bicycle, participate in physical activities, or take public transportation until you are done taking narcotic pain medicines or as directed by Dr. Sabra Heck.   Do not drink alcohol or take tranquilizers.   Do not take medicine that has not been prescribed by your physicians.   Do not sign important papers or make important decisions while on narcotic pain medicines.   Have a responsible person with you.   PAIN MANAGEMENT  Motrin 800mg .  (This is the same as 4-200mg  over the counter tablets of Motrin or ibuprofen.)  You may take this every eight hours or as needed for cramping.  You can alternate this with Tylenol 500mg  (two tabs every 6 hours as needed).  DO'S AND DON'T'S  Do not take a tub bath for one week.  You may shower on the first day after your surgery  Do not do any heavy lifting for one to two weeks.  This increases the chance of bleeding.  Do move around as you feel able.  Stairs are fine.  You may begin to exercise again as you feel able.  Do not lift any weights for two weeks.  Do not put anything in the vagina for four weeks--no tampons, intercourse, or douching.    REGULAR MEDIATIONS/VITAMINS:  You may restart all of your regular medications as prescribed.  You may restart all of your vitamins as you normally take them.    PLEASE CALL OR SEEK MEDICAL CARE IF:  You have persistent nausea and vomiting.   You have trouble eating or drinking.   You have an oral temperature above 100.5.   You have constipation that is not helped by adjusting diet or increasing fluid intake. Pain medicines are a common cause of constipation.   You have heavy vaginal bleeding   Post Anesthesia Home Care  Instructions  Activity: Get plenty of rest for the remainder of the day. A responsible individual must stay with you for 24 hours following the procedure.  For the next 24 hours, DO NOT: -Drive a car -Paediatric nurse -Drink alcoholic beverages -Take any medication unless instructed by your physician -Make any legal decisions or sign important papers.  Meals: Start with liquid foods such as gelatin or soup. Progress to regular foods as tolerated. Avoid greasy, spicy, heavy foods. If nausea and/or vomiting occur, drink only clear liquids until the nausea and/or vomiting subsides. Call your physician if vomiting continues.  Special Instructions/Symptoms: Your throat may feel dry or sore from the anesthesia or the breathing tube placed in your throat during surgery. If this causes discomfort, gargle with warm salt water. The discomfort should disappear within 24 hours.  If you had a scopolamine patch placed behind your ear for the management of post- operative nausea and/or vomiting:  1. The medication in the patch is effective for 72 hours, after which it should be removed.  Wrap patch in a tissue and discard in the trash. Wash hands thoroughly with soap and water. 2. You may remove the patch earlier than 72 hours if you experience unpleasant side effects which may include dry mouth, dizziness or visual disturbances. 3. Avoid touching the patch. Wash your hands with soap and water after contact with the patch.  No ibuprofen, Advil, Aleve, Motrin, or naproxen until after 4 pm today if needed.
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
## 2021-02-28 NOTE — Op Note (Signed)
02/28/2021  10:14 AM  PATIENT:  Jessica Ramsey  65 y.o. female  PRE-OPERATIVE DIAGNOSIS:  Possible HGSIL findings on ECC, CIN 1, persistent abnormal pap smears  POST-OPERATIVE DIAGNOSIS:  Possible HGSIL findings on ECC, CIN 1, persistent abnormal pap smears  PROCEDURE:  Procedure(s): COLPOSCOPY WITH LEEP CERVICAL BIOPSY/ ENDOCERVICAL CURRETTAGE  SURGEON:  Megan Salon  ASSISTANTS: OR staff.    ANESTHESIA:   general  ESTIMATED BLOOD LOSS: 2cc  BLOOD ADMINISTERED:none   FLUIDS: 700cc LR  UOP: pt voided prior to coming back to OR  SPECIMEN:  3 o'clock cervical biopsy, LEEP of cervix with stitch at 12 o'clock, ECC  DISPOSITION OF SPECIMEN:  PATHOLOGY  FINDINGS: with colposcopy, thickened white area noted at 3 o'clock on cervix.  This did not appear at AWE with acetic acid.  DESCRIPTION OF OPERATION: Patient was taken to the operating room.  She is placed in the supine position. SCDs were on her lower extremities and functioning properly. General anesthesia with an LMA was administered without difficulty. Dr. Jenita Seashore, anesthesia, oversaw case.  Legs were then placed in the [place] in the low lithotomy position. The legs were lifted to the high lithotomy position and the Betadine prep was used on the inner thighs perineum and vagina x3. Patient was draped in a normal standard fashion.  A painted speculum was placed the vagina. The anterior lip of the cervix was grasped with single-tooth tenaculum.  A paracervical block of 1% lidocaine mixed one-to-one with epinephrine (1:100,000 units).  6 cc was used total. Cerivcal os was dilated with a milex dilator.    Using the colposcopy, after application of acetic acid, the cervix was visualized with 7.5 X and 15x magnification.  There was a thickened white area on the cervix at 3 o'clock.  It did not appear as AWE with application of acetic acid.  Biopsy was still obtained.    Then using a 12 x 62mm leep, a conization was  performed of the cervix.  Stitch was placed at 12 o'clock.  Then an ECC was performed above this.  Ball cautery was then used the cauterize the cone bed.  There was almost no bleeding.  Monsel's was placed in the cone bed as well.    Pt tolerated procedure well.  Instruments were removed from the vaginal.  Sponge, lap and instrument counts were correct x 2.  Pt was awakened from anesthesia without difficulty and taken to the recovery room.  COUNTS:  YES  PLAN OF CARE: Transfer to PACU

## 2021-02-28 NOTE — Transfer of Care (Signed)
Immediate Anesthesia Transfer of Care Note  Patient: Jessica Ramsey  Procedure(s) Performed: COLPOSCOPY WITH LEEP (N/A Cervix) CERVICAL BIOPSY/ ENDOCERVICAL CURRETTAGE (N/A Cervix)  Patient Location: PACU  Anesthesia Type:General  Level of Consciousness: awake, alert , oriented and patient cooperative  Airway & Oxygen Therapy: Patient Spontanous Breathing and Patient connected to nasal cannula oxygen  Post-op Assessment: Report given to RN and Post -op Vital signs reviewed and stable  Post vital signs: Reviewed and stable  Last Vitals:  Vitals Value Taken Time  BP 141/55 02/28/21 1018  Temp 36.4 C 02/28/21 1018  Pulse 80 02/28/21 1022  Resp 11 02/28/21 1022  SpO2 98 % 02/28/21 1022  Vitals shown include unvalidated device data.  Last Pain:  Vitals:   02/28/21 0811  TempSrc: Oral  PainSc: 2       Patients Stated Pain Goal: 3 (97/47/18 5501)  Complications: No complications documented.

## 2021-02-28 NOTE — Anesthesia Postprocedure Evaluation (Signed)
Anesthesia Post Note  Patient: Jessica Ramsey  Procedure(s) Performed: COLPOSCOPY WITH LEEP (N/A Cervix) CERVICAL BIOPSY/ ENDOCERVICAL CURRETTAGE (N/A Cervix)     Patient location during evaluation: Phase II Anesthesia Type: General Level of consciousness: awake and alert, patient cooperative and oriented Pain management: pain level controlled Vital Signs Assessment: post-procedure vital signs reviewed and stable Respiratory status: nonlabored ventilation, spontaneous breathing and respiratory function stable Cardiovascular status: blood pressure returned to baseline and stable Postop Assessment: no apparent nausea or vomiting, able to ambulate and adequate PO intake Anesthetic complications: no   No complications documented.  Last Vitals:  Vitals:   02/28/21 1100 02/28/21 1145  BP: 105/67 124/64  Pulse: 64 67  Resp: (!) 9 12  Temp:  36.5 C  SpO2: 99% 98%    Last Pain:  Vitals:   02/28/21 1130  TempSrc:   PainSc: 0-No pain                 Daiana Vitiello,E. Lakenya Riendeau

## 2021-03-02 ENCOUNTER — Encounter (HOSPITAL_BASED_OUTPATIENT_CLINIC_OR_DEPARTMENT_OTHER): Payer: Self-pay | Admitting: Obstetrics & Gynecology

## 2021-03-02 LAB — SURGICAL PATHOLOGY

## 2021-03-23 DIAGNOSIS — Z23 Encounter for immunization: Secondary | ICD-10-CM | POA: Diagnosis not present

## 2021-03-28 ENCOUNTER — Encounter (HOSPITAL_BASED_OUTPATIENT_CLINIC_OR_DEPARTMENT_OTHER): Payer: Self-pay | Admitting: Obstetrics & Gynecology

## 2021-03-28 ENCOUNTER — Other Ambulatory Visit: Payer: Self-pay

## 2021-03-28 ENCOUNTER — Ambulatory Visit (INDEPENDENT_AMBULATORY_CARE_PROVIDER_SITE_OTHER): Payer: PPO | Admitting: Obstetrics & Gynecology

## 2021-03-28 VITALS — BP 126/69 | HR 66 | Ht 60.0 in | Wt 111.0 lb

## 2021-03-28 DIAGNOSIS — N870 Mild cervical dysplasia: Secondary | ICD-10-CM | POA: Diagnosis not present

## 2021-03-28 DIAGNOSIS — Z9889 Other specified postprocedural states: Secondary | ICD-10-CM | POA: Diagnosis not present

## 2021-03-28 DIAGNOSIS — R87613 High grade squamous intraepithelial lesion on cytologic smear of cervix (HGSIL): Secondary | ICD-10-CM
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
## 2021-06-19 ENCOUNTER — Other Ambulatory Visit: Payer: Self-pay | Admitting: Sports Medicine

## 2021-06-19 DIAGNOSIS — M545 Low back pain, unspecified: Secondary | ICD-10-CM

## 2021-06-25 DIAGNOSIS — M545 Low back pain, unspecified: Secondary | ICD-10-CM | POA: Diagnosis not present

## 2021-06-27 DIAGNOSIS — M545 Low back pain, unspecified: Secondary | ICD-10-CM | POA: Diagnosis not present

## 2021-06-29 DIAGNOSIS — M48062 Spinal stenosis, lumbar region with neurogenic claudication: Secondary | ICD-10-CM | POA: Diagnosis not present

## 2021-07-04 ENCOUNTER — Other Ambulatory Visit: Payer: Self-pay

## 2021-07-04 ENCOUNTER — Telehealth (HOSPITAL_BASED_OUTPATIENT_CLINIC_OR_DEPARTMENT_OTHER): Payer: Self-pay | Admitting: Obstetrics & Gynecology

## 2021-07-04 DIAGNOSIS — I350 Nonrheumatic aortic (valve) stenosis: Secondary | ICD-10-CM

## 2021-07-04 NOTE — Progress Notes (Signed)
Placing orders for patient to have yearly echo in 10/2021

## 2021-07-04 NOTE — Telephone Encounter (Signed)
Patient called about  the Serric Subsulfate solution it cost 112.51 she stated she  spent five hours on the phone with medicare and still was denied .She needs a letter stating why this was use the day of surgery so she can file appeal to medicare .This need be on hard copy mail to her per the patient .

## 2021-07-05 DIAGNOSIS — M5416 Radiculopathy, lumbar region: Secondary | ICD-10-CM | POA: Diagnosis not present

## 2021-07-05 DIAGNOSIS — G894 Chronic pain syndrome: Secondary | ICD-10-CM | POA: Diagnosis not present

## 2021-07-12 DIAGNOSIS — M48061 Spinal stenosis, lumbar region without neurogenic claudication: Secondary | ICD-10-CM | POA: Diagnosis not present

## 2021-07-12 DIAGNOSIS — M5441 Lumbago with sciatica, right side: Secondary | ICD-10-CM | POA: Diagnosis not present

## 2021-07-16 DIAGNOSIS — I10 Essential (primary) hypertension: Secondary | ICD-10-CM | POA: Diagnosis not present

## 2021-07-16 DIAGNOSIS — L570 Actinic keratosis: Secondary | ICD-10-CM | POA: Diagnosis not present

## 2021-07-16 DIAGNOSIS — L905 Scar conditions and fibrosis of skin: Secondary | ICD-10-CM | POA: Diagnosis not present

## 2021-07-16 DIAGNOSIS — L820 Inflamed seborrheic keratosis: Secondary | ICD-10-CM | POA: Diagnosis not present

## 2021-07-16 DIAGNOSIS — Z85828 Personal history of other malignant neoplasm of skin: Secondary | ICD-10-CM | POA: Diagnosis not present

## 2021-07-16 DIAGNOSIS — E039 Hypothyroidism, unspecified: Secondary | ICD-10-CM | POA: Diagnosis not present

## 2021-07-16 DIAGNOSIS — D485 Neoplasm of uncertain behavior of skin: Secondary | ICD-10-CM | POA: Diagnosis not present

## 2021-07-16 DIAGNOSIS — L989 Disorder of the skin and subcutaneous tissue, unspecified: Secondary | ICD-10-CM | POA: Diagnosis not present

## 2021-07-16 DIAGNOSIS — L439 Lichen planus, unspecified: Secondary | ICD-10-CM | POA: Diagnosis not present

## 2021-07-16 DIAGNOSIS — C44519 Basal cell carcinoma of skin of other part of trunk: Secondary | ICD-10-CM | POA: Diagnosis not present

## 2021-07-23 DIAGNOSIS — I10 Essential (primary) hypertension: Secondary | ICD-10-CM | POA: Diagnosis not present

## 2021-07-23 DIAGNOSIS — K219 Gastro-esophageal reflux disease without esophagitis: Secondary | ICD-10-CM | POA: Diagnosis not present

## 2021-07-23 DIAGNOSIS — G8929 Other chronic pain: Secondary | ICD-10-CM | POA: Diagnosis not present

## 2021-07-23 DIAGNOSIS — M5441 Lumbago with sciatica, right side: Secondary | ICD-10-CM | POA: Diagnosis not present

## 2021-07-23 DIAGNOSIS — M797 Fibromyalgia: Secondary | ICD-10-CM | POA: Diagnosis not present

## 2021-07-23 DIAGNOSIS — G4700 Insomnia, unspecified: Secondary | ICD-10-CM | POA: Diagnosis not present

## 2021-07-23 DIAGNOSIS — I350 Nonrheumatic aortic (valve) stenosis: Secondary | ICD-10-CM | POA: Diagnosis not present

## 2021-07-24 DIAGNOSIS — M5416 Radiculopathy, lumbar region: Secondary | ICD-10-CM | POA: Diagnosis not present

## 2021-07-25 ENCOUNTER — Telehealth (HOSPITAL_BASED_OUTPATIENT_CLINIC_OR_DEPARTMENT_OTHER): Payer: Self-pay | Admitting: Obstetrics & Gynecology

## 2021-07-25 NOTE — Telephone Encounter (Signed)
Patient called and would  like to get the letter for medicare for medication Dr Sabra Heck use doing her surgery.

## 2021-07-31 NOTE — Telephone Encounter (Signed)
DOB verified. Pt called stating that Medicare denied coverage for a medication that was used during a procedure she had done in April. She couldn't remember the name of the medication but is to call the office back when she gets home and provide it. She states that she needs a letter of medical necessity for the medication that was used during the procedure since it is an OTC medication. Pt needs this letter to send into insurance so that she can be reimbursed the 112.51 that she had to pay out of pocket. Advised pt to call back with name of medication and I would get Dr. Sabra Heck to look into why it was used. Pt verbalized understanding.

## 2021-08-02 NOTE — Telephone Encounter (Signed)
Spoke with pt regarding the medication that medicare refused payment on. She states that insurance denied payment on ferric subsulfate solution. I requested that pt upload or send [place] a copy of the denial letter. Pt states that she will work on getting that done today.

## 2021-08-09 DIAGNOSIS — M5416 Radiculopathy, lumbar region: Secondary | ICD-10-CM | POA: Diagnosis not present

## 2021-08-09 DIAGNOSIS — M545 Low back pain, unspecified: Secondary | ICD-10-CM | POA: Diagnosis not present

## 2021-08-09 DIAGNOSIS — M48062 Spinal stenosis, lumbar region with neurogenic claudication: Secondary | ICD-10-CM | POA: Diagnosis not present

## 2021-08-10 DIAGNOSIS — Z1283 Encounter for screening for malignant neoplasm of skin: Secondary | ICD-10-CM | POA: Diagnosis not present

## 2021-08-10 DIAGNOSIS — L905 Scar conditions and fibrosis of skin: Secondary | ICD-10-CM | POA: Diagnosis not present

## 2021-08-10 DIAGNOSIS — L989 Disorder of the skin and subcutaneous tissue, unspecified: Secondary | ICD-10-CM | POA: Diagnosis not present

## 2021-08-10 DIAGNOSIS — D0362 Melanoma in situ of left upper limb, including shoulder: Secondary | ICD-10-CM | POA: Diagnosis not present

## 2021-08-10 DIAGNOSIS — L578 Other skin changes due to chronic exposure to nonionizing radiation: Secondary | ICD-10-CM | POA: Diagnosis not present

## 2021-08-16 ENCOUNTER — Ambulatory Visit (HOSPITAL_BASED_OUTPATIENT_CLINIC_OR_DEPARTMENT_OTHER): Payer: PPO | Admitting: Obstetrics & Gynecology

## 2021-08-24 DIAGNOSIS — M5416 Radiculopathy, lumbar region: Secondary | ICD-10-CM | POA: Diagnosis not present

## 2021-08-27 DIAGNOSIS — M5416 Radiculopathy, lumbar region: Secondary | ICD-10-CM | POA: Diagnosis not present

## 2021-09-06 DIAGNOSIS — Z23 Encounter for immunization: Secondary | ICD-10-CM | POA: Diagnosis not present

## 2021-09-07 DIAGNOSIS — L905 Scar conditions and fibrosis of skin: Secondary | ICD-10-CM | POA: Diagnosis not present

## 2021-09-07 DIAGNOSIS — C44519 Basal cell carcinoma of skin of other part of trunk: Secondary | ICD-10-CM | POA: Diagnosis not present

## 2021-09-07 DIAGNOSIS — L570 Actinic keratosis: Secondary | ICD-10-CM | POA: Diagnosis not present

## 2021-09-13 ENCOUNTER — Telehealth (HOSPITAL_BASED_OUTPATIENT_CLINIC_OR_DEPARTMENT_OTHER): Payer: Self-pay

## 2021-09-13 DIAGNOSIS — G894 Chronic pain syndrome: Secondary | ICD-10-CM | POA: Diagnosis not present

## 2021-09-13 NOTE — Telephone Encounter (Signed)
Patient called today wanting to know where we stand as far as giving her a letter to submit to medicare for billing. She states that she spoke with someone in our office about a month ago but still has not resolved this issue. She would like for someone to call her. tbw

## 2021-09-20 DIAGNOSIS — H40033 Anatomical narrow angle, bilateral: Secondary | ICD-10-CM | POA: Diagnosis not present

## 2021-09-20 DIAGNOSIS — H04123 Dry eye syndrome of bilateral lacrimal glands: Secondary | ICD-10-CM | POA: Diagnosis not present

## 2021-09-20 DIAGNOSIS — M545 Low back pain, unspecified: Secondary | ICD-10-CM | POA: Diagnosis not present

## 2021-10-03 DIAGNOSIS — S70362A Insect bite (nonvenomous), left thigh, initial encounter: Secondary | ICD-10-CM | POA: Diagnosis not present

## 2021-10-03 DIAGNOSIS — W57XXXA Bitten or stung by nonvenomous insect and other nonvenomous arthropods, initial encounter: Secondary | ICD-10-CM | POA: Diagnosis not present

## 2021-10-12 ENCOUNTER — Other Ambulatory Visit (HOSPITAL_COMMUNITY)
Admission: RE | Admit: 2021-10-12 | Discharge: 2021-10-12 | Disposition: A | Discharge status: Home / Self Care | Payer: PPO | Source: Ambulatory Visit | Attending: Obstetrics & Gynecology | Admitting: Obstetrics & Gynecology

## 2021-10-12 ENCOUNTER — Ambulatory Visit (HOSPITAL_BASED_OUTPATIENT_CLINIC_OR_DEPARTMENT_OTHER): Payer: PPO | Admitting: Obstetrics & Gynecology

## 2021-10-12 ENCOUNTER — Other Ambulatory Visit: Payer: Self-pay

## 2021-10-12 ENCOUNTER — Encounter (HOSPITAL_BASED_OUTPATIENT_CLINIC_OR_DEPARTMENT_OTHER): Payer: Self-pay | Admitting: Obstetrics & Gynecology

## 2021-10-12 VITALS — BP 143/52 | HR 66 | Ht 59.5 in | Wt 113.6 lb

## 2021-10-12 DIAGNOSIS — N870 Mild cervical dysplasia: Secondary | ICD-10-CM | POA: Diagnosis not present

## 2021-10-12 DIAGNOSIS — Z87410 Personal history of cervical dysplasia: Secondary | ICD-10-CM | POA: Insufficient documentation

## 2021-10-12 DIAGNOSIS — R87810 Cervical high risk human papillomavirus (HPV) DNA test positive: Secondary | ICD-10-CM | POA: Diagnosis not present

## 2021-10-12 DIAGNOSIS — Z1151 Encounter for screening for human papillomavirus (HPV): Secondary | ICD-10-CM | POA: Diagnosis not present

## 2021-10-12 DIAGNOSIS — Z01419 Encounter for gynecological examination (general) (routine) without abnormal findings: Secondary | ICD-10-CM | POA: Diagnosis not present
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
## 2021-10-22 LAB — CYTOLOGY - PAP
Comment: NEGATIVE
Diagnosis: NEGATIVE
High risk HPV: POSITIVE — AB

## 2021-10-26 ENCOUNTER — Ambulatory Visit (HOSPITAL_COMMUNITY): Payer: PPO | Attending: Cardiology

## 2021-10-26 ENCOUNTER — Other Ambulatory Visit: Payer: Self-pay

## 2021-10-26 DIAGNOSIS — I350 Nonrheumatic aortic (valve) stenosis: Secondary | ICD-10-CM | POA: Insufficient documentation

## 2021-10-26 LAB — ECHOCARDIOGRAM COMPLETE
AR max vel: 0.76 cm2
AV Area VTI: 0.85 cm2
AV Area mean vel: 0.72 cm2
AV Mean grad: 30 mmHg
AV Peak grad: 50.4 mmHg
Ao pk vel: 3.55 m/s
Area-P 1/2: 3.63 cm2
P 1/2 time: 356 msec
S' Lateral: 2.3 cm

## 2021-10-28 ENCOUNTER — Encounter: Payer: Self-pay | Admitting: Cardiology

## 2021-10-28 DIAGNOSIS — I351 Nonrheumatic aortic (valve) insufficiency: Secondary | ICD-10-CM | POA: Insufficient documentation

## 2021-10-29 DIAGNOSIS — M5416 Radiculopathy, lumbar region: Secondary | ICD-10-CM | POA: Diagnosis not present

## 2021-11-12 ENCOUNTER — Encounter: Payer: Self-pay | Admitting: Cardiology

## 2021-11-12 ENCOUNTER — Other Ambulatory Visit (HOSPITAL_COMMUNITY): Payer: Self-pay | Admitting: Obstetrics & Gynecology

## 2021-11-12 DIAGNOSIS — I351 Nonrheumatic aortic (valve) insufficiency: Secondary | ICD-10-CM

## 2021-11-12 DIAGNOSIS — I350 Nonrheumatic aortic (valve) stenosis: Secondary | ICD-10-CM

## 2021-11-12 DIAGNOSIS — Z1231 Encounter for screening mammogram for malignant neoplasm of breast: Secondary | ICD-10-CM

## 2021-11-12 NOTE — Telephone Encounter (Signed)
I spoke with patient and reviewed echo results with her.  She would like to proceed with structural heart referral.

## 2021-11-13 ENCOUNTER — Encounter: Payer: Self-pay | Admitting: Physician Assistant

## 2021-11-15 DIAGNOSIS — R059 Cough, unspecified: Secondary | ICD-10-CM | POA: Diagnosis not present

## 2021-11-15 DIAGNOSIS — Z85828 Personal history of other malignant neoplasm of skin: Secondary | ICD-10-CM | POA: Diagnosis not present

## 2021-11-15 DIAGNOSIS — Z87891 Personal history of nicotine dependence: Secondary | ICD-10-CM | POA: Diagnosis not present

## 2021-11-15 DIAGNOSIS — Z20822 Contact with and (suspected) exposure to covid-19: Secondary | ICD-10-CM | POA: Diagnosis not present

## 2021-11-15 DIAGNOSIS — C4362 Malignant melanoma of left upper limb, including shoulder: Secondary | ICD-10-CM | POA: Diagnosis not present

## 2021-11-15 DIAGNOSIS — R0989 Other specified symptoms and signs involving the circulatory and respiratory systems: Secondary | ICD-10-CM | POA: Diagnosis not present

## 2021-11-15 DIAGNOSIS — J029 Acute pharyngitis, unspecified: Secondary | ICD-10-CM | POA: Diagnosis not present

## 2021-11-15 DIAGNOSIS — Z08 Encounter for follow-up examination after completed treatment for malignant neoplasm: Secondary | ICD-10-CM | POA: Diagnosis not present

## 2021-11-15 DIAGNOSIS — R5383 Other fatigue: Secondary | ICD-10-CM | POA: Diagnosis not present

## 2021-11-15 DIAGNOSIS — R0981 Nasal congestion: Secondary | ICD-10-CM | POA: Diagnosis not present

## 2021-11-15 DIAGNOSIS — R519 Headache, unspecified: Secondary | ICD-10-CM | POA: Diagnosis not present

## 2021-11-15 DIAGNOSIS — B9789 Other viral agents as the cause of diseases classified elsewhere: Secondary | ICD-10-CM | POA: Diagnosis not present

## 2021-11-15 DIAGNOSIS — L570 Actinic keratosis: Secondary | ICD-10-CM | POA: Diagnosis not present

## 2021-11-15 DIAGNOSIS — R229 Localized swelling, mass and lump, unspecified: Secondary | ICD-10-CM | POA: Diagnosis not present

## 2021-11-15 DIAGNOSIS — J069 Acute upper respiratory infection, unspecified: Secondary | ICD-10-CM | POA: Diagnosis not present

## 2021-11-15 DIAGNOSIS — Z85820 Personal history of malignant melanoma of skin: Secondary | ICD-10-CM | POA: Diagnosis not present

## 2021-12-03 ENCOUNTER — Ambulatory Visit (HOSPITAL_BASED_OUTPATIENT_CLINIC_OR_DEPARTMENT_OTHER): Payer: PPO | Admitting: Obstetrics & Gynecology

## 2021-12-03 ENCOUNTER — Encounter (HOSPITAL_BASED_OUTPATIENT_CLINIC_OR_DEPARTMENT_OTHER): Payer: Self-pay | Admitting: Obstetrics & Gynecology

## 2021-12-03 ENCOUNTER — Encounter: Payer: Self-pay | Admitting: Cardiovascular Disease

## 2021-12-03 ENCOUNTER — Other Ambulatory Visit: Payer: Self-pay

## 2021-12-03 VITALS — BP 150/75 | HR 66 | Ht 60.5 in | Wt 113.8 lb

## 2021-12-03 DIAGNOSIS — A609 Anogenital herpesviral infection, unspecified: Secondary | ICD-10-CM

## 2021-12-03 NOTE — Patient Instructions (Signed)
Suppressive therapy:  400mg  twice daily  Recurrent outbreak: 800mg  (two tablets) twice daily for 5 days
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
## 2021-12-05 ENCOUNTER — Other Ambulatory Visit: Payer: Self-pay

## 2021-12-05 ENCOUNTER — Encounter: Payer: Self-pay | Admitting: Cardiovascular Disease

## 2021-12-05 ENCOUNTER — Ambulatory Visit: Payer: PPO | Admitting: Cardiovascular Disease

## 2021-12-05 VITALS — BP 138/70 | HR 60 | Ht 60.6 in | Wt 112.8 lb

## 2021-12-05 DIAGNOSIS — I351 Nonrheumatic aortic (valve) insufficiency: Secondary | ICD-10-CM

## 2021-12-05 DIAGNOSIS — I350 Nonrheumatic aortic (valve) stenosis: Secondary | ICD-10-CM | POA: Diagnosis not present

## 2021-12-05 NOTE — Progress Notes (Addendum)
Pre Surgical Assessment: 5 M Walk Test  22M=16.38ft  5 Meter Walk Test- trial 1: 6.85 seconds 5 Meter Walk Test- trial 2: 5.35 seconds 5 Meter Walk Test- trial 3: 5.22 seconds 5 Meter Walk Test Average: 5.81 seconds  STS calculation:  Isolated AVR  Risk of Mortality: 2.195% Renal Failure: 1.004% Permanent Stroke: 1.811% Prolonged Ventilation: 6.753% DSW Infection: 0.049% Reoperation: 3.877% Morbidity or Mortality: 10.551% Short Length of Stay: 44.534% Long Length of Stay: 3.132%

## 2021-12-05 NOTE — Patient Instructions (Addendum)
Medication Instructions:  Your physician recommends that you continue on your current medications as directed. Please refer to the Current Medication list given to you today.  *If you need a refill on your cardiac medications before your next appointment, please call your pharmacy*   Lab Work: BMET and CBC  Testing/Procedures: Your physician has requested that you have a cardiac catheterization. Cardiac catheterization is used to diagnose and/or treat various heart conditions. Doctors may recommend this procedure for a number of different reasons. The most common reason is to evaluate chest pain. Chest pain can be a symptom of coronary artery disease (CAD), and cardiac catheterization can show whether plaque is narrowing or blocking your hearts arteries. This procedure is also used to evaluate the valves, as well as measure the blood flow and oxygen levels in different parts of your heart. For further information please visit [email or web address]. Please follow instruction sheet, as given.   Follow-Up: Per structual heart valve team.   Other Instructions  You are scheduled for a Cardiac Catheterization on Friday, January 27 with Dr. Lauree Chandler.  1. Please arrive at the [facility] (Main Entrance A) at [facility]: [address] [place], [place] [address] at 7:00 AM (This time is two hours before your procedure to ensure your preparation). Free valet parking service is available.   Special note: Every effort is made to have your procedure done on time. Please understand that emergencies sometimes delay scheduled procedures.  2. Diet: Do not eat solid foods after midnight.  The patient may have clear liquids until 5am upon the day of the procedure.  3. Labs: TODAY  4. Medication instructions in preparation for your procedure:   Contrast Allergy: No   On the morning of your procedure, take your Aspirin and any morning medicines NOT listed above.  You may use sips of  water.  5. Plan for one night stay--bring personal belongings. 6. Bring a current list of your medications and current insurance cards. 7. You MUST have a responsible person to drive you home. 8. Someone MUST be with you the first 24 hours after you arrive home or your discharge will be delayed. 9. Please wear clothes that are easy to get on and off and wear slip-on shoes.  Thank you for allowing [place] to care for you!   -- [facility]

## 2021-12-06 ENCOUNTER — Ambulatory Visit: Payer: PPO | Admitting: Cardiology

## 2021-12-06 LAB — CBC
Hematocrit: 37 % (ref 34.0–46.6)
Hemoglobin: 12.8 g/dL (ref 11.1–15.9)
MCH: 31.2 pg (ref 26.6–33.0)
MCHC: 34.6 g/dL (ref 31.5–35.7)
MCV: 90 fL (ref 79–97)
Platelets: 286 10*3/uL (ref 150–450)
RBC: 4.1 x10E6/uL (ref 3.77–5.28)
RDW: 11 % — ABNORMAL LOW (ref 11.7–15.4)
WBC: 8.1 10*3/uL (ref 3.4–10.8)

## 2021-12-06 LAB — BASIC METABOLIC PANEL
BUN/Creatinine Ratio: 15 (ref 12–28)
BUN: 14 mg/dL (ref 8–27)
CO2: 24 mmol/L (ref 20–29)
Calcium: 10 mg/dL (ref 8.7–10.3)
Chloride: 96 mmol/L (ref 96–106)
Creatinine, Ser: 0.95 mg/dL (ref 0.57–1.00)
Glucose: 84 mg/dL (ref 70–99)
Potassium: 4.6 mmol/L (ref 3.5–5.2)
Sodium: 135 mmol/L (ref 134–144)
eGFR: 67 mL/min/{1.73_m2} (ref >59)
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
## 2021-12-07 ENCOUNTER — Encounter (HOSPITAL_BASED_OUTPATIENT_CLINIC_OR_DEPARTMENT_OTHER): Payer: Self-pay | Admitting: Obstetrics & Gynecology

## 2021-12-07 ENCOUNTER — Other Ambulatory Visit (HOSPITAL_COMMUNITY): Payer: Self-pay

## 2021-12-14 ENCOUNTER — Other Ambulatory Visit (HOSPITAL_COMMUNITY): Payer: Self-pay

## 2021-12-14 MED ORDER — METHOCARBAMOL 500 MG PO TABS
ORAL_TABLET | ORAL | 2 refills | Status: DC
  Filled 2021-12-14: qty 60, 8d supply, fill #0
  Filled 2022-01-17: qty 60, 8d supply, fill #1
  Filled 2022-04-23: qty 60, 8d supply, fill #2

## 2021-12-18 ENCOUNTER — Other Ambulatory Visit (HOSPITAL_COMMUNITY): Payer: Self-pay

## 2021-12-19 ENCOUNTER — Other Ambulatory Visit: Payer: Self-pay | Admitting: Physician Assistant

## 2021-12-19 DIAGNOSIS — I350 Nonrheumatic aortic (valve) stenosis: Secondary | ICD-10-CM

## 2021-12-20 ENCOUNTER — Encounter (HOSPITAL_BASED_OUTPATIENT_CLINIC_OR_DEPARTMENT_OTHER): Payer: Self-pay | Admitting: Obstetrics & Gynecology

## 2021-12-20 ENCOUNTER — Telehealth: Payer: Self-pay | Admitting: *Deleted

## 2021-12-20 ENCOUNTER — Encounter: Payer: Self-pay | Admitting: Physician Assistant

## 2021-12-20 NOTE — Telephone Encounter (Addendum)
Cardiac catheterization scheduled at [facility] for: Friday December 21, 2021 [facility] Main Entrance [facility]) at: 7 AM   Diet-no solid food after midnight prior to cath, clear liquids until 5 AM day of procedure.  Medication instructions for procedure: -Usual morning medications can be taken pre-cath with sips of water including aspirin 81 mg.    Confirmed patient has responsible adult to drive home post procedure and be with patient first 24 hours after arriving home.  [facility] does allow one visitor to accompany you and wait in the hospital waiting room while you are there for your procedure. You and your visitor will be asked to wear a mask once you enter the hospital.   Patient reports does not currently have any new symptoms concerning for COVID-19 and no household members with COVID-19 like illness.      Reviewed procedure /mask/visitor instructions with patient.

## 2021-12-21 ENCOUNTER — Ambulatory Visit (HOSPITAL_COMMUNITY)
Admission: RE | Admit: 2021-12-21 | Discharge: 2021-12-21 | Disposition: A | Discharge status: Home / Self Care | Payer: PPO | Attending: Cardiovascular Disease | Admitting: Cardiovascular Disease

## 2021-12-21 ENCOUNTER — Encounter (HOSPITAL_COMMUNITY)
Admission: RE | Disposition: A | Discharge status: Home / Self Care | Payer: Self-pay | Source: Home / Self Care | Attending: Cardiovascular Disease

## 2021-12-21 DIAGNOSIS — M797 Fibromyalgia: Secondary | ICD-10-CM | POA: Insufficient documentation

## 2021-12-21 DIAGNOSIS — E039 Hypothyroidism, unspecified: Secondary | ICD-10-CM | POA: Diagnosis not present

## 2021-12-21 DIAGNOSIS — I10 Essential (primary) hypertension: Secondary | ICD-10-CM | POA: Diagnosis not present

## 2021-12-21 DIAGNOSIS — Z8673 Personal history of transient ischemic attack (TIA), and cerebral infarction without residual deficits: Secondary | ICD-10-CM | POA: Diagnosis not present

## 2021-12-21 DIAGNOSIS — Z87891 Personal history of nicotine dependence: Secondary | ICD-10-CM | POA: Insufficient documentation

## 2021-12-21 DIAGNOSIS — I350 Nonrheumatic aortic (valve) stenosis: Secondary | ICD-10-CM | POA: Diagnosis not present

## 2021-12-21 DIAGNOSIS — R5383 Other fatigue: Secondary | ICD-10-CM | POA: Diagnosis not present

## 2021-12-21 DIAGNOSIS — I083 Combined rheumatic disorders of mitral, aortic and tricuspid valves: Secondary | ICD-10-CM | POA: Insufficient documentation

## 2021-12-21 DIAGNOSIS — F32A Depression, unspecified: Secondary | ICD-10-CM | POA: Diagnosis not present

## 2021-12-21 HISTORY — PX: RIGHT/LEFT HEART CATH AND CORONARY ANGIOGRAPHY: CATH118266

## 2021-12-21 LAB — POCT I-STAT EG7
Acid-Base Excess: 0 mmol/L (ref 0.0–2.0)
Bicarbonate: 25.6 mmol/L (ref 20.0–28.0)
Calcium, Ion: 1.12 mmol/L — ABNORMAL LOW (ref 1.15–1.40)
HCT: 33 % — ABNORMAL LOW (ref 36.0–46.0)
Hemoglobin: 11.2 g/dL — ABNORMAL LOW (ref 12.0–15.0)
O2 Saturation: 79 %
Potassium: 3.7 mmol/L (ref 3.5–5.1)
Sodium: 141 mmol/L (ref 135–145)
TCO2: 27 mmol/L (ref 22–32)
pCO2, Ven: 47.4 mmHg (ref 44.0–60.0)
pH, Ven: 7.341 (ref 7.250–7.430)
pO2, Ven: 46 mmHg — ABNORMAL HIGH (ref 32.0–45.0)

## 2021-12-21 LAB — POCT I-STAT 7, (LYTES, BLD GAS, ICA,H+H)
Acid-Base Excess: 1 mmol/L (ref 0.0–2.0)
Bicarbonate: 26.8 mmol/L (ref 20.0–28.0)
Calcium, Ion: 1.22 mmol/L (ref 1.15–1.40)
HCT: 34 % — ABNORMAL LOW (ref 36.0–46.0)
Hemoglobin: 11.6 g/dL — ABNORMAL LOW (ref 12.0–15.0)
O2 Saturation: 100 %
Potassium: 4 mmol/L (ref 3.5–5.1)
Sodium: 138 mmol/L (ref 135–145)
TCO2: 28 mmol/L (ref 22–32)
pCO2 arterial: 47.5 mmHg (ref 32.0–48.0)
pH, Arterial: 7.359 (ref 7.350–7.450)
pO2, Arterial: 186 mmHg — ABNORMAL HIGH (ref 83.0–108.0)

## 2021-12-21 SURGERY — RIGHT/LEFT HEART CATH AND CORONARY ANGIOGRAPHY
Anesthesia: LOCAL

## 2021-12-21 MED ORDER — ONDANSETRON HCL 4 MG/2ML IJ SOLN: 4.0000 mg | Freq: Four times a day (QID) | INTRAMUSCULAR | Status: DC | PRN

## 2021-12-21 MED ORDER — VERAPAMIL HCL 2.5 MG/ML IV SOLN
INTRAVENOUS | Status: DC | PRN
  Administered 2021-12-21: 10 mL via INTRA_ARTERIAL

## 2021-12-21 MED ORDER — HEPARIN SODIUM (PORCINE) 1000 UNIT/ML IJ SOLN
INTRAMUSCULAR | Status: AC
  Filled 2021-12-21: qty 10

## 2021-12-21 MED ORDER — HEPARIN (PORCINE) IN NACL 1000-0.9 UT/500ML-% IV SOLN
INTRAVENOUS | Status: AC
  Filled 2021-12-21: qty 1000

## 2021-12-21 MED ORDER — LABETALOL HCL 5 MG/ML IV SOLN: 10.0000 mg | INTRAVENOUS | Status: DC | PRN

## 2021-12-21 MED ORDER — FENTANYL CITRATE (PF) 100 MCG/2ML IJ SOLN
INTRAMUSCULAR | Status: AC
  Filled 2021-12-21: qty 2

## 2021-12-21 MED ORDER — SODIUM CHLORIDE 0.9 % WEIGHT BASED INFUSION
3.0000 mL/kg/h | INTRAVENOUS | Status: AC
  Administered 2021-12-21: 3 mL/kg/h via INTRAVENOUS

## 2021-12-21 MED ORDER — FENTANYL CITRATE (PF) 100 MCG/2ML IJ SOLN
INTRAMUSCULAR | Status: DC | PRN
  Administered 2021-12-21: 25 ug via INTRAVENOUS

## 2021-12-21 MED ORDER — SODIUM CHLORIDE 0.9 % IV SOLN: 250.0000 mL | INTRAVENOUS | Status: DC | PRN

## 2021-12-21 MED ORDER — ACETAMINOPHEN 325 MG PO TABS: 650.0000 mg | ORAL_TABLET | ORAL | Status: DC | PRN

## 2021-12-21 MED ORDER — LIDOCAINE HCL (PF) 1 % IJ SOLN
INTRAMUSCULAR | Status: AC
  Filled 2021-12-21: qty 30

## 2021-12-21 MED ORDER — HEPARIN SODIUM (PORCINE) 1000 UNIT/ML IJ SOLN
INTRAMUSCULAR | Status: DC | PRN
  Administered 2021-12-21: 3000 [IU] via INTRAVENOUS

## 2021-12-21 MED ORDER — LIDOCAINE HCL (PF) 1 % IJ SOLN
INTRAMUSCULAR | Status: DC | PRN
  Administered 2021-12-21 (×2): 2 mL

## 2021-12-21 MED ORDER — MIDAZOLAM HCL 2 MG/2ML IJ SOLN
INTRAMUSCULAR | Status: AC
  Filled 2021-12-21: qty 2

## 2021-12-21 MED ORDER — SODIUM CHLORIDE 0.9% FLUSH: 3.0000 mL | INTRAVENOUS | Status: DC | PRN

## 2021-12-21 MED ORDER — HEPARIN (PORCINE) IN NACL 1000-0.9 UT/500ML-% IV SOLN
INTRAVENOUS | Status: DC | PRN
  Administered 2021-12-21 (×2): 500 mL

## 2021-12-21 MED ORDER — SODIUM CHLORIDE 0.9 % IV SOLN: INTRAVENOUS | Status: AC

## 2021-12-21 MED ORDER — ASPIRIN 81 MG PO CHEW: 81.0000 mg | CHEWABLE_TABLET | ORAL | Status: DC

## 2021-12-21 MED ORDER — SODIUM CHLORIDE 0.9% FLUSH: 3.0000 mL | Freq: Two times a day (BID) | INTRAVENOUS | Status: DC

## 2021-12-21 MED ORDER — MIDAZOLAM HCL 2 MG/2ML IJ SOLN
INTRAMUSCULAR | Status: DC | PRN
  Administered 2021-12-21: 1 mg via INTRAVENOUS

## 2021-12-21 MED ORDER — IOHEXOL 350 MG/ML SOLN
INTRAVENOUS | Status: DC | PRN
  Administered 2021-12-21: 30 mL

## 2021-12-21 MED ORDER — HYDRALAZINE HCL 20 MG/ML IJ SOLN: 10.0000 mg | INTRAMUSCULAR | Status: DC | PRN

## 2021-12-21 MED ORDER — SODIUM CHLORIDE 0.9 % WEIGHT BASED INFUSION: 1.0000 mL/kg/h | INTRAVENOUS | Status: DC

## 2021-12-21 SURGICAL SUPPLY — 11 items

## 2021-12-21 NOTE — Interval H&P Note (Signed)
History and Physical Interval Note:  12/21/2021 7:27 AM  [name]  has presented today for surgery, with the diagnosis of severe aortic stenosis.  The various methods of treatment have been discussed with the patient and family. After consideration of risks, benefits and other options for treatment, the patient has consented to  Procedure(s): RIGHT/LEFT HEART CATH AND CORONARY ANGIOGRAPHY (N/A) as a surgical intervention.  The patient's history has been reviewed, patient examined, no change in status, stable for surgery.  I have reviewed the patient's chart and labs.  Questions were answered to the patient's satisfaction.    Cath Lab Visit (complete for each Cath Lab visit)  Clinical Evaluation Leading to the Procedure:   ACS: No.  Non-ACS:    Anginal Classification: CCS II  Anti-ischemic medical therapy: No Therapy  Non-Invasive Test Results: No non-invasive testing performed  Prior CABG: No previous CABG        Lauree Chandler

## 2021-12-24 ENCOUNTER — Encounter (HOSPITAL_COMMUNITY): Payer: Self-pay | Admitting: Cardiovascular Disease

## 2021-12-27 ENCOUNTER — Other Ambulatory Visit (HOSPITAL_COMMUNITY): Payer: Self-pay

## 2021-12-27 MED ORDER — MONTELUKAST SODIUM 10 MG PO TABS
ORAL_TABLET | Freq: Every day | ORAL | 3 refills | Status: AC
  Filled 2021-12-27: qty 90, 90d supply, fill #0
  Filled 2022-04-01: qty 90, 90d supply, fill #1

## 2021-12-27 MED ORDER — MELOXICAM 15 MG PO TABS
15.0000 mg | ORAL_TABLET | Freq: Every day | ORAL | 3 refills | Status: DC
  Filled 2021-12-27: qty 90, 90d supply, fill #0
  Filled 2022-04-01: qty 90, 90d supply, fill #1

## 2021-12-27 MED ORDER — LISINOPRIL 10 MG PO TABS
10.0000 mg | ORAL_TABLET | Freq: Every day | ORAL | 3 refills | Status: DC
  Filled 2021-12-27: qty 90, 90d supply, fill #0

## 2021-12-27 MED ORDER — DULOXETINE HCL 60 MG PO CPEP
ORAL_CAPSULE | ORAL | 3 refills | Status: DC
  Filled 2021-12-27: qty 90, 90d supply, fill #0
  Filled 2022-04-01: qty 90, 90d supply, fill #1

## 2022-01-03 ENCOUNTER — Other Ambulatory Visit: Payer: Self-pay

## 2022-01-03 ENCOUNTER — Ambulatory Visit (HOSPITAL_COMMUNITY)
Admission: RE | Admit: 2022-01-03 | Discharge: 2022-01-03 | Disposition: A | Discharge status: Home / Self Care | Payer: PPO | Source: Ambulatory Visit | Attending: Physician Assistant | Admitting: Physician Assistant

## 2022-01-03 ENCOUNTER — Encounter (HOSPITAL_COMMUNITY): Payer: Self-pay

## 2022-01-03 DIAGNOSIS — I350 Nonrheumatic aortic (valve) stenosis: Secondary | ICD-10-CM

## 2022-01-03 MED ORDER — IOHEXOL 350 MG/ML SOLN
95.0000 mL | Freq: Once | INTRAVENOUS | Status: AC | PRN
Start: 2022-01-03 — End: 2022-01-03
  Administered 2022-01-03: 95 mL via INTRAVENOUS

## 2022-01-03 MED ORDER — NITROGLYCERIN 0.4 MG SL SUBL: 0.8000 mg | SUBLINGUAL_TABLET | Freq: Once | SUBLINGUAL | Status: DC

## 2022-01-10 ENCOUNTER — Encounter: Payer: Self-pay | Admitting: Surgery

## 2022-01-10 ENCOUNTER — Other Ambulatory Visit: Payer: Self-pay

## 2022-01-10 ENCOUNTER — Institutional Professional Consult (permissible substitution): Payer: PPO | Admitting: Surgery

## 2022-01-10 ENCOUNTER — Ambulatory Visit (HOSPITAL_COMMUNITY): Payer: PPO

## 2022-01-10 VITALS — BP 160/76 | HR 75 | Resp 20 | Ht 60.0 in | Wt 110.0 lb

## 2022-01-10 DIAGNOSIS — I350 Nonrheumatic aortic (valve) stenosis: Secondary | ICD-10-CM | POA: Diagnosis not present

## 2022-01-10 NOTE — Progress Notes (Signed)
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   Optimum Fluoroscopic Angle for Delivery: LAO 6, CAU 11   Valves for structural team consideration:   23 mm CoreValve- Sinus of Valsalva height and diameter are a the lower limit (15 mm and 25 mm recommended)   SAVR may be worth of consideration.   Non TAVR Valve Findings:   Normal pulmonary artery diameter.   Patent left atrial appendage.   Normal pulmonary vein drainage.   Coronary Calcium Score:   Left main: 0   Left anterior descending artery: 0   Left circumflex artery: 0   Right coronary artery: 0   Total: 0   Percentile: 1st for age, sex, and race matched control.   IMPRESSION: 1. Moderate Aortic  stenosis. Findings pertinent to procedures are detailed above.   2. Patient's total coronary artery calcium score is 0, which is 1st percentile for subjects of the same age, gender, and race based populations.   RECOMMENDATIONS:   Coronary artery calcium (CAC) score is a strong predictor of incident coronary heart disease (CHD) and provides predictive information beyond traditional risk factors. CAC scoring is reasonable to use in the decision to withhold, postpone, or initiate statin therapy in intermediate-risk or selected borderline-risk asymptomatic adults (age 38-75 years and LDL-C >=70 to <190 mg/dL) who do not have diabetes or established atherosclerotic cardiovascular disease (ASCVD).* In intermediate-risk (10-year ASCVD risk >=7.5% to <20%) adults or selected borderline-risk (10-year ASCVD risk >=5% to <7.5%) adults in whom a CAC score is measured for the purpose of making a treatment decision the following recommendations have been made:   If CAC = 0, it is reasonable to withhold statin therapy and reassess in 5 to 10 years, as long as higher risk conditions are absent (diabetes mellitus, family history of premature CHD in first degree relatives (males <55 years; females <65 years), cigarette smoking, LDL >=190 mg/dL or other independent risk factors).   If CAC is 1 to 99, it is reasonable to initiate statin therapy for patients >=16 years of age.   If CAC is >=100 or >=75th percentile, it is reasonable to initiate statin therapy at any age.   Cardiology referral should be considered for patients with CAC scores >=400 or >=75th percentile.   *2018 AHA/ACC/AACVPR/AAPA/ABC/ACPM/ADA/AGS/APhA/ASPC/NLA/PCNA Guideline on the Management  of Blood Cholesterol: A Report of the [employer]/American Heart Association Task Force on Clinical Practice Guidelines. J Am Coll Cardiol. 2019;73(24):3168-3209.   Mahesh  Chandrasekhar     Electronically Signed    By: Rudean Haskell M.D.   On: 01/03/2022 12:52   Impression:  This 66 year old woman has stage D, symptomatic, severe aortic stenosis and severe insufficiency with New York Heart Association class III symptoms of exertional fatigue and shortness of breath consistent with chronic diastolic congestive heart failure.  I have personally reviewed her echocardiogram, cardiac catheterization, and CTA studies.  Her aortic valve is not well visualized on her echo but the mean gradient is 30 mmHg and the valve appears moderately calcified and thickened with restricted leaflet mobility.  There is severe insufficiency.  Left ventricular systolic function is normal with an ejection fraction of 65 to 70% with normal LV dimensions and mild LVH.  Cardiac catheterization shows no coronary disease.  The mean gradient across aortic valve was 55 mmHg suggesting severe aortic stenosis.  I agree that aortic valve replacement is indicated in this patient for relief of her symptoms and to prevent progressive left ventricular dysfunction.  She is a small woman with a BSA of 1.46 and a BMI of 21.5.  Her aortic annulus is relatively small and proportional to her body size.  An Edwards transcatheter valve would be too large for her annulus.  A small 23 mm Medtronic valve would fit but the sinus height and diameter are borderline.  I think she is a surgical candidate and her best long-term prognosis will be with open surgical aortic valve replacement using either a Resilia pericardial bioprosthesis or a porcine root.  I think this will give her the best long-term valve durability and if one of those valves deteriorates in the future and requires intervention then she could probably have valve in valve TAVR.  If a 23 mm Medtronic TAVR valve was inserted now and deteriorated it would make management much more difficult and likely require open surgical removal of the TAVR prosthesis and aortic root replacement.  I discussed all this  with her and her daughter and my recommendation for open surgical aortic valve replacement.  They seem to understand  and are in full agreement.   Plan:  She will be scheduled for open surgical aortic valve replacement using a bioprosthetic valve or porcine root on Friday, 01/18/2022.  I spent 60 minutes performing this consultation and > 50% of this time was spent face to face counseling and coordinating the care of this patient's severe symptomatic aortic stenosis and aortic insufficiency.   Gaye Pollack, MD [facility] [phone]

## 2022-01-11 ENCOUNTER — Other Ambulatory Visit: Payer: Self-pay | Admitting: *Deleted

## 2022-01-11 ENCOUNTER — Encounter: Payer: Self-pay | Admitting: *Deleted

## 2022-01-11 DIAGNOSIS — I350 Nonrheumatic aortic (valve) stenosis: Secondary | ICD-10-CM

## 2022-01-15 NOTE — Pre-Procedure Instructions (Addendum)
Surgical Instructions    Your procedure is scheduled on Friday, February 24th.  Report to [facility] Main Entrance "A" at 05:30 A.M., then check in with the Admitting office.  Call this number if you have problems the morning of surgery:  [phone]   If you have any questions prior to your surgery date call [phone]: Open Monday-Friday 8am-4pm    Remember:  Do not eat or drink after midnight the night before your surgery     Take these medicines the morning of surgery with A SIP OF WATER  cycloSPORINE (RESTASIS) DULoxetine (CYMBALTA)  montelukast (SINGULAIR)   If needed: acetaminophen (TYLENOL)  acyclovir (ZOVIRAX)  cetirizine (ZYRTEC) fluticasone (FLONASE)  gabapentin (NEURONTIN) HYDROcodone-acetaminophen (NORCO/VICODIN) methocarbamol (ROBAXIN) omeprazole (PRILOSEC)   As of today, STOP taking any Aspirin (unless otherwise instructed by your surgeon) Aleve, Naproxen, Ibuprofen, Motrin, Advil, Goody's, BC's, all herbal medications, fish oil, and all vitamins. This includes your meloxicam (MOBIC).                     Do NOT Smoke (Tobacco/Vaping) for 24 hours prior to your procedure.  If you use a CPAP at night, you may bring your mask/headgear for your overnight stay.   Contacts, glasses, piercing's, hearing aid's, dentures or partials may not be worn into surgery, please bring cases for these belongings.    For patients admitted to the hospital, discharge time will be determined by your treatment team.   Patients discharged the day of surgery will not be allowed to drive home, and someone needs to stay with them for 24 hours.  NO VISITORS WILL BE ALLOWED IN PRE-OP WHERE PATIENTS ARE PREPPED FOR SURGERY.  ONLY 1 SUPPORT PERSON MAY BE PRESENT IN THE WAITING ROOM WHILE YOU ARE IN SURGERY.  IF YOU ARE TO BE ADMITTED, ONCE YOU ARE IN YOUR ROOM YOU WILL BE ALLOWED TWO (2) VISITORS. (1) VISITOR MAY STAY OVERNIGHT BUT MUST ARRIVE TO THE ROOM BY 8pm.  Minor children may have  two parents present. Special consideration for safety and communication needs will be reviewed on a case by case basis.   Special instructions:   [place]- Preparing For Surgery  Before surgery, you can play an important role. Because skin is not sterile, your skin needs to be as free of germs as possible. You can reduce the number of germs on your skin by washing with CHG (chlorahexidine gluconate) Soap before surgery.  CHG is an antiseptic cleaner which kills germs and bonds with the skin to continue killing germs even after washing.    Oral Hygiene is also important to reduce your risk of infection.  Remember - BRUSH YOUR TEETH THE MORNING OF SURGERY WITH YOUR REGULAR TOOTHPASTE  Please do not use if you have an allergy to CHG or antibacterial soaps. If your skin becomes reddened/irritated stop using the CHG.  Do not shave (including legs and underarms) for at least 48 hours prior to first CHG shower. It is OK to shave your face.  Please follow these instructions carefully.   Shower the NIGHT BEFORE SURGERY and the MORNING OF SURGERY  If you chose to wash your hair, wash your hair first as usual with your normal shampoo.  After you shampoo, rinse your hair and body thoroughly to remove the shampoo.  Use CHG Soap as you would any other liquid soap. You can apply CHG directly to the skin and wash gently with a scrungie or a clean washcloth.   Apply the CHG  Soap to your body ONLY FROM THE NECK DOWN.  Do not use on open wounds or open sores. Avoid contact with your eyes, ears, mouth and genitals (private parts). Wash Face and genitals (private parts)  with your normal soap.   Wash thoroughly, paying special attention to the area where your surgery will be performed.  Thoroughly rinse your body with warm water from the neck down.  DO NOT shower/wash with your normal soap after using and rinsing off the CHG Soap.  Pat yourself dry with a CLEAN TOWEL.  Wear CLEAN PAJAMAS to bed the  night before surgery  Place CLEAN SHEETS on your bed the night before your surgery  DO NOT SLEEP WITH PETS.   Day of Surgery: Shower with CHG soap. Do not wear jewelry, make up, nail polish, gel polish, artificial nails, or any other type of covering on natural nails including finger and toenails. If patients have artificial nails, gel coating, etc. that need to be removed by a nail salon please have this removed prior to surgery. Surgery may need to be canceled/delayed if the surgeon/anesthesiologist feels like the patient is unable to be adequately monitored. Do not wear lotions, powders, perfumes, or deodorant. Do not shave 48 hours prior to surgery.   Do not bring valuables to the hospital. [facility] is not responsible for any belongings or valuables. Wear Clean/Comfortable clothing the morning of surgery Remember to brush your teeth WITH YOUR REGULAR TOOTHPASTE.   Please read over the following fact sheets that you were given.   3 days prior to your procedure or After your COVID test   You are not required to quarantine however you are required to wear a well-fitting mask when you are out and around people not in your household. If your mask becomes wet or soiled, replace with a new one.   Wash your hands often with soap and water for 20 seconds or clean your hands with an alcohol-based hand sanitizer that contains at least 60% alcohol.   Do not share personal items.   Notify your provider:  o if you are in close contact with someone who has COVID  o or if you develop a fever of 100.4 or greater, sneezing, cough, sore throat, shortness of breath or body aches.

## 2022-01-16 ENCOUNTER — Encounter (HOSPITAL_COMMUNITY): Payer: Self-pay

## 2022-01-16 ENCOUNTER — Other Ambulatory Visit: Payer: Self-pay

## 2022-01-16 ENCOUNTER — Encounter (HOSPITAL_COMMUNITY)
Admission: RE | Admit: 2022-01-16 | Discharge: 2022-01-16 | Disposition: A | Discharge status: Home / Self Care | Payer: PPO | Source: Ambulatory Visit | Attending: Surgery | Admitting: Surgery

## 2022-01-16 ENCOUNTER — Ambulatory Visit (HOSPITAL_BASED_OUTPATIENT_CLINIC_OR_DEPARTMENT_OTHER)
Admission: RE | Admit: 2022-01-16 | Discharge: 2022-01-16 | Disposition: A | Discharge status: Home / Self Care | Payer: PPO | Source: Ambulatory Visit | Attending: Surgery | Admitting: Surgery

## 2022-01-16 ENCOUNTER — Ambulatory Visit (HOSPITAL_COMMUNITY)
Admission: RE | Admit: 2022-01-16 | Discharge: 2022-01-16 | Disposition: A | Discharge status: Home / Self Care | Payer: PPO | Source: Ambulatory Visit | Attending: Surgery | Admitting: Surgery

## 2022-01-16 VITALS — BP 143/69 | HR 62 | Temp 97.7°F | Resp 18 | Ht 60.0 in | Wt 114.1 lb

## 2022-01-16 DIAGNOSIS — Z01818 Encounter for other preprocedural examination: Secondary | ICD-10-CM

## 2022-01-16 DIAGNOSIS — I350 Nonrheumatic aortic (valve) stenosis: Secondary | ICD-10-CM | POA: Diagnosis not present

## 2022-01-16 LAB — COMPREHENSIVE METABOLIC PANEL
ALT: 18 U/L (ref 0–44)
AST: 25 U/L (ref 15–41)
Albumin: 3.9 g/dL (ref 3.5–5.0)
Alkaline Phosphatase: 48 U/L (ref 38–126)
Anion gap: 10 (ref 5–15)
BUN: 12 mg/dL (ref 8–23)
CO2: 24 mmol/L (ref 22–32)
Calcium: 9.3 mg/dL (ref 8.9–10.3)
Chloride: 95 mmol/L — ABNORMAL LOW (ref 98–111)
Creatinine, Ser: 0.82 mg/dL (ref 0.44–1.00)
GFR, Estimated: >60 mL/min (ref >60)
Glucose, Bld: 90 mg/dL (ref 70–99)
Potassium: 4.7 mmol/L (ref 3.5–5.1)
Sodium: 129 mmol/L — ABNORMAL LOW (ref 135–145)
Total Bilirubin: 0.5 mg/dL (ref 0.3–1.2)
Total Protein: 6.7 g/dL (ref 6.5–8.1)

## 2022-01-16 LAB — URINALYSIS, ROUTINE W REFLEX MICROSCOPIC
Bilirubin Urine: NEGATIVE
Glucose, UA: NEGATIVE mg/dL
Hgb urine dipstick: NEGATIVE
Ketones, ur: NEGATIVE mg/dL
Leukocytes,Ua: NEGATIVE
Nitrite: NEGATIVE
Protein, ur: NEGATIVE mg/dL
Specific Gravity, Urine: 1.005 (ref 1.005–1.030)
pH: 5 (ref 5.0–8.0)

## 2022-01-16 LAB — SURGICAL PCR SCREEN
MRSA, PCR: NEGATIVE
Staphylococcus aureus: NEGATIVE

## 2022-01-16 LAB — CBC
HCT: 36.4 % (ref 36.0–46.0)
Hemoglobin: 12.2 g/dL (ref 12.0–15.0)
MCH: 32.2 pg (ref 26.0–34.0)
MCHC: 33.5 g/dL (ref 30.0–36.0)
MCV: 96 fL (ref 80.0–100.0)
Platelets: 248 10*3/uL (ref 150–400)
RBC: 3.79 MIL/uL — ABNORMAL LOW (ref 3.87–5.11)
RDW: 12.1 % (ref 11.5–15.5)
WBC: 8 10*3/uL (ref 4.0–10.5)
nRBC: 0 % (ref 0.0–0.2)

## 2022-01-16 LAB — HEMOGLOBIN A1C
Hgb A1c MFr Bld: 5.4 % (ref 4.8–5.6)
Mean Plasma Glucose: 108 mg/dL

## 2022-01-16 LAB — BLOOD GAS, ARTERIAL
Acid-Base Excess: 2.8 mmol/L — ABNORMAL HIGH (ref 0.0–2.0)
Bicarbonate: 27.2 mmol/L (ref 20.0–28.0)
Drawn by: 58793
FIO2: 21 %
O2 Saturation: 98.5 %
Patient temperature: 37
pCO2 arterial: 40 mmHg (ref 32–48)
pH, Arterial: 7.44 (ref 7.35–7.45)
pO2, Arterial: 114 mmHg — ABNORMAL HIGH (ref 83–108)

## 2022-01-16 LAB — PROTIME-INR
INR: 0.9 (ref 0.8–1.2)
Prothrombin Time: 12.5 seconds (ref 11.4–15.2)

## 2022-01-16 LAB — APTT: aPTT: 26 seconds (ref 24–36)

## 2022-01-16 LAB — SARS CORONAVIRUS 2 (TAT 6-24 HRS): SARS Coronavirus 2: NEGATIVE

## 2022-01-16 NOTE — Progress Notes (Signed)
Pre-AVR ultrasound study completed.   Please see CV Proc for preliminary results.   Darlin Coco, RDMS, RVT

## 2022-01-16 NOTE — Progress Notes (Signed)
PCP - Bing Matter, PA-C Cardiologist - Dr. Fransico Him. Pt has seen Dr. Angelena Form and Dr. Cyndia Bent in the past 2 months  PPM/ICD - denies   Chest x-ray - 01/16/22 at PAT EKG - 01/16/22 at PAT Stress Test - denies ECHO - 10/26/21 Cardiac Cath - 12/21/21  Sleep Study - denies  DM- denies   Blood Thinner Instructions: n/a Aspirin Instructions: n/a  ERAS Protcol - no, NPO   COVID TEST- 01/16/22 at PAT   Anesthesia review: yes, cardiac hx  Patient denies shortness of breath, fever, cough and chest pain at PAT appointment   All instructions explained to the patient, with a verbal understanding of the material. Patient agrees to go over the instructions while at home for a better understanding. Patient also instructed to wear a mask in public after being tested for COVID-19. The opportunity to ask questions was provided.

## 2022-01-17 ENCOUNTER — Other Ambulatory Visit (HOSPITAL_COMMUNITY): Payer: Self-pay

## 2022-01-17 MED ORDER — EPINEPHRINE HCL 5 MG/250ML IV SOLN IN NS
0.0000 ug/min | INTRAVENOUS | Status: DC
  Filled 2022-01-17: qty 250

## 2022-01-17 MED ORDER — PLASMA-LYTE A IV SOLN
INTRAVENOUS | Status: DC
  Filled 2022-01-17: qty 2.5

## 2022-01-17 MED ORDER — TRANEXAMIC ACID (OHS) PUMP PRIME SOLUTION
2.0000 mg/kg | INTRAVENOUS | Status: DC
  Filled 2022-01-17: qty 1.04

## 2022-01-17 MED ORDER — INSULIN REGULAR(HUMAN) IN NACL 100-0.9 UT/100ML-% IV SOLN
INTRAVENOUS | Status: AC
  Administered 2022-01-18: .7 [IU]/h via INTRAVENOUS
  Filled 2022-01-17: qty 100

## 2022-01-17 MED ORDER — TRANEXAMIC ACID (OHS) BOLUS VIA INFUSION
15.0000 mg/kg | INTRAVENOUS | Status: AC
  Administered 2022-01-18: 777 mg via INTRAVENOUS
  Filled 2022-01-17: qty 777

## 2022-01-17 MED ORDER — VANCOMYCIN HCL IN DEXTROSE 1-5 GM/200ML-% IV SOLN
1000.0000 mg | INTRAVENOUS | Status: AC
  Administered 2022-01-18: 1000 mg via INTRAVENOUS
  Filled 2022-01-17: qty 200

## 2022-01-17 MED ORDER — HEPARIN 30,000 UNITS/1000 ML (OHS) CELLSAVER SOLUTION
Status: DC
Start: 2022-01-18 — End: 2022-01-18
  Filled 2022-01-17: qty 1000

## 2022-01-17 MED ORDER — MILRINONE LACTATE IN DEXTROSE 20-5 MG/100ML-% IV SOLN
0.3000 ug/kg/min | INTRAVENOUS | Status: DC
  Filled 2022-01-17: qty 100

## 2022-01-17 MED ORDER — LEVOFLOXACIN IN D5W 500 MG/100ML IV SOLN
500.0000 mg | INTRAVENOUS | Status: AC
  Administered 2022-01-18: 500 mg via INTRAVENOUS
  Filled 2022-01-17: qty 100

## 2022-01-17 MED ORDER — NITROGLYCERIN IN D5W 200-5 MCG/ML-% IV SOLN
2.0000 ug/min | INTRAVENOUS | Status: DC
  Filled 2022-01-17: qty 250

## 2022-01-17 MED ORDER — MANNITOL 20 % IV SOLN
INTRAVENOUS | Status: DC
  Filled 2022-01-17: qty 13

## 2022-01-17 MED ORDER — DEXMEDETOMIDINE HCL IN NACL 400 MCG/100ML IV SOLN
0.1000 ug/kg/h | INTRAVENOUS | Status: AC
  Administered 2022-01-18: .4 ug/kg/h via INTRAVENOUS
  Filled 2022-01-17: qty 100

## 2022-01-17 MED ORDER — NOREPINEPHRINE 4 MG/250ML-% IV SOLN
0.0000 ug/min | INTRAVENOUS | Status: DC
  Filled 2022-01-17: qty 250

## 2022-01-17 MED ORDER — TRANEXAMIC ACID 1000 MG/10ML IV SOLN
1.5000 mg/kg/h | INTRAVENOUS | Status: AC
  Administered 2022-01-18: 1.5 mg/kg/h via INTRAVENOUS
  Filled 2022-01-17: qty 25

## 2022-01-17 MED ORDER — PHENYLEPHRINE HCL-NACL 20-0.9 MG/250ML-% IV SOLN
30.0000 ug/min | INTRAVENOUS | Status: AC
  Administered 2022-01-18: 50 ug/min via INTRAVENOUS
  Filled 2022-01-17: qty 250

## 2022-01-17 MED ORDER — POTASSIUM CHLORIDE 2 MEQ/ML IV SOLN
80.0000 meq | INTRAVENOUS | Status: DC
Start: 2022-01-18 — End: 2022-01-18
  Filled 2022-01-17: qty 40

## 2022-01-17 NOTE — H&P (Signed)
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   Right coronary cusp width: 24 mm   Left coronary cusp width: 24 mm   Non coronary cusp width: 25 mm   Mean diameter: 25 mm   Coronary Artery Height above Annulus:   Left Main: 12 mm   Left SoV height: 14 mm   Right Coronary: 10 mm   Right SoV height: 14 mm   Optimum Fluoroscopic Angle for Delivery: LAO 6, CAU 11   Valves for structural team consideration:   23 mm CoreValve- Sinus of Valsalva height and diameter are a the lower limit (15 mm and 25 mm recommended)   SAVR may be worth of consideration.   Non TAVR Valve Findings:   Normal pulmonary artery diameter.   Patent left atrial appendage.    Normal pulmonary vein drainage.   Coronary Calcium Score:   Left main: 0   Left anterior descending artery: 0   Left circumflex artery: 0   Right coronary artery: 0  Total: 0   Percentile: 1st for age, sex, and race matched control.   IMPRESSION: 1. Moderate Aortic stenosis. Findings pertinent to procedures are detailed above.   2. Patient's total coronary artery calcium score is 0, which is 1st percentile for subjects of the same age, gender, and race based populations.   RECOMMENDATIONS:   Coronary artery calcium (CAC) score is a strong predictor of incident coronary heart disease (CHD) and provides predictive information beyond traditional risk factors. CAC scoring is reasonable to use in the decision to withhold, postpone, or initiate statin therapy in intermediate-risk or selected borderline-risk asymptomatic adults (age 40-75 years and LDL-C >=70 to <190 mg/dL) who do not have diabetes or established atherosclerotic cardiovascular disease (ASCVD).* In intermediate-risk (10-year ASCVD risk >=7.5% to <20%) adults or selected borderline-risk (10-year ASCVD risk >=5% to <7.5%) adults in whom a CAC score is measured for the purpose of making a treatment decision the following recommendations have been made:   If CAC = 0, it is reasonable to withhold statin therapy and reassess in 5 to 10 years, as long as higher risk conditions are absent (diabetes mellitus, family history of premature CHD in first degree relatives (males <55 years; females <65 years), cigarette smoking, LDL >=190 mg/dL or other independent risk factors).   If CAC is 1 to 99, it is reasonable to initiate statin therapy for patients >=41 years of age.   If CAC is >=100 or >=75th percentile, it is reasonable to initiate statin therapy at any age.   Cardiology referral should be considered for patients with CAC scores >=400 or >=75th percentile.   *2018  AHA/ACC/AACVPR/AAPA/ABC/ACPM/ADA/AGS/APhA/ASPC/NLA/PCNA Guideline on the Management of Blood Cholesterol: A Report of the American College of Cardiology/American Heart Association Task Force on Clinical Practice Guidelines. J Am Coll Cardiol. 2019;73(24):3168-3209.   Mahesh  Chandrasekhar     Electronically Signed   By: Rudean Haskell M.D.   On: 01/03/2022 12:52   Impression:   This 66 year old woman has stage D, symptomatic, severe aortic stenosis and severe insufficiency with New York Heart Association class III symptoms of exertional fatigue and shortness of breath consistent with chronic diastolic congestive heart failure.  I have personally reviewed her echocardiogram, cardiac catheterization, and CTA studies.  Her aortic valve is not well visualized on her echo but the mean gradient is 30 mmHg and the valve appears moderately calcified and thickened with restricted leaflet mobility.  There is severe insufficiency.  Left ventricular systolic function is normal with an ejection fraction of 65 to 70% with normal LV dimensions and mild LVH.  Cardiac catheterization shows no coronary disease.  The mean gradient across aortic valve was 55 mmHg suggesting severe aortic stenosis.  I agree that aortic valve replacement is indicated in this patient for relief of her symptoms and to prevent progressive left ventricular dysfunction.  She is a small woman with a BSA of 1.46 and a BMI of 21.5.  Her aortic annulus is relatively small and proportional to her body size.  An Edwards transcatheter valve would be too large for her annulus.  A small 23 mm Medtronic valve would fit but the sinus height and diameter are borderline.  I think she is a surgical candidate and her best long-term prognosis will be with open surgical aortic valve replacement using either a Resilia pericardial bioprosthesis or a porcine root.  I think this will give her the best long-term valve durability and if one of those valves  deteriorates in the future and requires  intervention then she could probably have valve in valve TAVR.  If a 23 mm Medtronic TAVR valve was inserted now and deteriorated it would make management much more difficult and likely require open surgical removal of the TAVR prosthesis and aortic root replacement.  I discussed all this with her and her daughter and my recommendation for open surgical aortic valve replacement.  They seem to understand  and are in full agreement.     Plan:   Open surgical aortic valve replacement using a bioprosthetic valve or porcine root.       Gaye Pollack, MD [facility] [phone]

## 2022-01-17 NOTE — Anesthesia Preprocedure Evaluation (Addendum)
Anesthesia Evaluation  Patient identified by MRN, date of birth, ID band Patient awake    Reviewed: Allergy & Precautions, H&P , NPO status , Patient's Chart, lab work & pertinent test results  History of Anesthesia Complications (+) PONV and history of anesthetic complications  Airway Mallampati: III  TM Distance: >3 FB Neck ROM: Full    Dental no notable dental hx. (+) Partial Upper, Partial Lower, Poor Dentition, Dental Advisory Given   Pulmonary neg pulmonary ROS, former smoker,    Pulmonary exam normal breath sounds clear to auscultation       Cardiovascular Exercise Tolerance: Good hypertension, Pt. on medications + Valvular Problems/Murmurs AS  Rhythm:Regular Rate:Normal + Systolic murmurs    Neuro/Psych Anxiety Depression CVA    GI/Hepatic Neg liver ROS, GERD  Medicated,  Endo/Other  Hypothyroidism   Renal/GU negative Renal ROS  negative genitourinary   Musculoskeletal  (+) Arthritis , Fibromyalgia -  Abdominal   Peds  Hematology negative hematology ROS (+)   Anesthesia Other Findings   Reproductive/Obstetrics negative OB ROS                            Anesthesia Physical Anesthesia Plan  ASA: 4  Anesthesia Plan: General   Post-op Pain Management: Tylenol PO (pre-op)*   Induction: Intravenous  PONV Risk Score and Plan: 4 or greater and Midazolam, Ondansetron and Treatment may vary due to age or medical condition  Airway Management Planned: Oral ETT  Additional Equipment: Arterial line, CVP, PA Cath, TEE, 3D TEE and Ultrasound Guidance Line Placement  Intra-op Plan:   Post-operative Plan: Post-operative intubation/ventilation  Informed Consent: I have reviewed the patients History and Physical, chart, labs and discussed the procedure including the risks, benefits and alternatives for the proposed anesthesia with the patient or authorized representative who has  indicated his/her understanding and acceptance.     Dental advisory given  Plan Discussed with: CRNA  Anesthesia Plan Comments:        Anesthesia Quick Evaluation

## 2022-01-18 ENCOUNTER — Inpatient Hospital Stay (HOSPITAL_COMMUNITY): Payer: PPO | Admitting: Physician Assistant

## 2022-01-18 ENCOUNTER — Ambulatory Visit (HOSPITAL_COMMUNITY): Payer: PPO

## 2022-01-18 ENCOUNTER — Inpatient Hospital Stay (HOSPITAL_COMMUNITY): Payer: PPO

## 2022-01-18 ENCOUNTER — Other Ambulatory Visit: Payer: Self-pay

## 2022-01-18 ENCOUNTER — Encounter (HOSPITAL_COMMUNITY)
Admission: RE | Disposition: A | Discharge status: Home Health | Payer: Self-pay | Source: Home / Self Care | Attending: Surgery

## 2022-01-18 ENCOUNTER — Encounter (HOSPITAL_COMMUNITY): Payer: Self-pay | Admitting: Surgery

## 2022-01-18 ENCOUNTER — Inpatient Hospital Stay (HOSPITAL_COMMUNITY)
Admission: RE | Admit: 2022-01-18 | Discharge: 2022-01-22 | DRG: 220 | Disposition: A | Discharge status: Home Health | Payer: PPO | Attending: Surgery | Admitting: Surgery

## 2022-01-18 ENCOUNTER — Inpatient Hospital Stay (HOSPITAL_COMMUNITY): Payer: PPO | Admitting: Anesthesiology

## 2022-01-18 DIAGNOSIS — M797 Fibromyalgia: Secondary | ICD-10-CM | POA: Diagnosis present

## 2022-01-18 DIAGNOSIS — F419 Anxiety disorder, unspecified: Secondary | ICD-10-CM | POA: Diagnosis present

## 2022-01-18 DIAGNOSIS — I10 Essential (primary) hypertension: Secondary | ICD-10-CM | POA: Diagnosis present

## 2022-01-18 DIAGNOSIS — Z952 Presence of prosthetic heart valve: Principal | ICD-10-CM

## 2022-01-18 DIAGNOSIS — E8770 Fluid overload, unspecified: Secondary | ICD-10-CM | POA: Diagnosis not present

## 2022-01-18 DIAGNOSIS — Z888 Allergy status to other drugs, medicaments and biological substances status: Secondary | ICD-10-CM

## 2022-01-18 DIAGNOSIS — D62 Acute posthemorrhagic anemia: Secondary | ICD-10-CM | POA: Diagnosis not present

## 2022-01-18 DIAGNOSIS — Z825 Family history of asthma and other chronic lower respiratory diseases: Secondary | ICD-10-CM

## 2022-01-18 DIAGNOSIS — I352 Nonrheumatic aortic (valve) stenosis with insufficiency: Secondary | ICD-10-CM | POA: Diagnosis present

## 2022-01-18 DIAGNOSIS — Z833 Family history of diabetes mellitus: Secondary | ICD-10-CM | POA: Diagnosis not present

## 2022-01-18 DIAGNOSIS — Z791 Long term (current) use of non-steroidal anti-inflammatories (NSAID): Secondary | ICD-10-CM | POA: Diagnosis not present

## 2022-01-18 DIAGNOSIS — K219 Gastro-esophageal reflux disease without esophagitis: Secondary | ICD-10-CM | POA: Diagnosis present

## 2022-01-18 DIAGNOSIS — F418 Other specified anxiety disorders: Secondary | ICD-10-CM | POA: Diagnosis not present

## 2022-01-18 DIAGNOSIS — Z602 Problems related to living alone: Secondary | ICD-10-CM | POA: Diagnosis present

## 2022-01-18 DIAGNOSIS — M1990 Unspecified osteoarthritis, unspecified site: Secondary | ICD-10-CM | POA: Diagnosis present

## 2022-01-18 DIAGNOSIS — Z820 Family history of epilepsy and other diseases of the nervous system: Secondary | ICD-10-CM

## 2022-01-18 DIAGNOSIS — G8929 Other chronic pain: Secondary | ICD-10-CM | POA: Diagnosis present

## 2022-01-18 DIAGNOSIS — Z8673 Personal history of transient ischemic attack (TIA), and cerebral infarction without residual deficits: Secondary | ICD-10-CM | POA: Diagnosis not present

## 2022-01-18 DIAGNOSIS — Z8616 Personal history of COVID-19: Secondary | ICD-10-CM

## 2022-01-18 DIAGNOSIS — F17210 Nicotine dependence, cigarettes, uncomplicated: Secondary | ICD-10-CM | POA: Diagnosis present

## 2022-01-18 DIAGNOSIS — Z801 Family history of malignant neoplasm of trachea, bronchus and lung: Secondary | ICD-10-CM | POA: Diagnosis not present

## 2022-01-18 DIAGNOSIS — Z881 Allergy status to other antibiotic agents status: Secondary | ICD-10-CM

## 2022-01-18 DIAGNOSIS — I351 Nonrheumatic aortic (valve) insufficiency: Secondary | ICD-10-CM | POA: Diagnosis not present

## 2022-01-18 DIAGNOSIS — Z09 Encounter for follow-up examination after completed treatment for conditions other than malignant neoplasm: Secondary | ICD-10-CM

## 2022-01-18 DIAGNOSIS — M25552 Pain in left hip: Secondary | ICD-10-CM | POA: Diagnosis present

## 2022-01-18 DIAGNOSIS — T68XXXA Hypothermia, initial encounter: Secondary | ICD-10-CM | POA: Diagnosis not present

## 2022-01-18 DIAGNOSIS — F17290 Nicotine dependence, other tobacco product, uncomplicated: Secondary | ICD-10-CM | POA: Diagnosis present

## 2022-01-18 DIAGNOSIS — Z8249 Family history of ischemic heart disease and other diseases of the circulatory system: Secondary | ICD-10-CM

## 2022-01-18 DIAGNOSIS — R110 Nausea: Secondary | ICD-10-CM | POA: Diagnosis not present

## 2022-01-18 DIAGNOSIS — Z79899 Other long term (current) drug therapy: Secondary | ICD-10-CM

## 2022-01-18 DIAGNOSIS — E039 Hypothyroidism, unspecified: Secondary | ICD-10-CM | POA: Diagnosis present

## 2022-01-18 DIAGNOSIS — Z885 Allergy status to narcotic agent status: Secondary | ICD-10-CM

## 2022-01-18 DIAGNOSIS — I350 Nonrheumatic aortic (valve) stenosis: Secondary | ICD-10-CM

## 2022-01-18 DIAGNOSIS — Z91030 Bee allergy status: Secondary | ICD-10-CM

## 2022-01-18 HISTORY — PX: TEE WITHOUT CARDIOVERSION: SHX5443

## 2022-01-18 HISTORY — PX: AORTIC VALVE REPLACEMENT: SHX41

## 2022-01-18 LAB — POCT I-STAT 7, (LYTES, BLD GAS, ICA,H+H)
Acid-Base Excess: 1 mmol/L (ref 0.0–2.0)
Acid-Base Excess: 5 mmol/L — ABNORMAL HIGH (ref 0.0–2.0)
Acid-Base Excess: 4 mmol/L — ABNORMAL HIGH (ref 0.0–2.0)
Acid-Base Excess: 0 mmol/L (ref 0.0–2.0)
Acid-Base Excess: 2 mmol/L (ref 0.0–2.0)
Acid-Base Excess: 0 mmol/L (ref 0.0–2.0)
Acid-base deficit: 2 mmol/L (ref 0.0–2.0)
Acid-base deficit: 3 mmol/L — ABNORMAL HIGH (ref 0.0–2.0)
Acid-base deficit: 2 mmol/L (ref 0.0–2.0)
Bicarbonate: 26.9 mmol/L (ref 20.0–28.0)
Bicarbonate: 27.6 mmol/L (ref 20.0–28.0)
Bicarbonate: 27.5 mmol/L (ref 20.0–28.0)
Bicarbonate: 23.9 mmol/L (ref 20.0–28.0)
Bicarbonate: 25.7 mmol/L (ref 20.0–28.0)
Bicarbonate: 25.3 mmol/L (ref 20.0–28.0)
Bicarbonate: 25.3 mmol/L (ref 20.0–28.0)
Bicarbonate: 22.8 mmol/L (ref 20.0–28.0)
Bicarbonate: 24.4 mmol/L (ref 20.0–28.0)
Calcium, Ion: 1.21 mmol/L (ref 1.15–1.40)
Calcium, Ion: 0.93 mmol/L — ABNORMAL LOW (ref 1.15–1.40)
Calcium, Ion: 0.91 mmol/L — ABNORMAL LOW (ref 1.15–1.40)
Calcium, Ion: 0.87 mmol/L — CL (ref 1.15–1.40)
Calcium, Ion: 0.91 mmol/L — ABNORMAL LOW (ref 1.15–1.40)
Calcium, Ion: 1.03 mmol/L — ABNORMAL LOW (ref 1.15–1.40)
Calcium, Ion: 1.07 mmol/L — ABNORMAL LOW (ref 1.15–1.40)
Calcium, Ion: 1.07 mmol/L — ABNORMAL LOW (ref 1.15–1.40)
Calcium, Ion: 1.12 mmol/L — ABNORMAL LOW (ref 1.15–1.40)
HCT: 32 % — ABNORMAL LOW (ref 36.0–46.0)
HCT: 20 % — ABNORMAL LOW (ref 36.0–46.0)
HCT: 19 % — ABNORMAL LOW (ref 36.0–46.0)
HCT: 25 % — ABNORMAL LOW (ref 36.0–46.0)
HCT: 25 % — ABNORMAL LOW (ref 36.0–46.0)
HCT: 29 % — ABNORMAL LOW (ref 36.0–46.0)
HCT: 26 % — ABNORMAL LOW (ref 36.0–46.0)
HCT: 24 % — ABNORMAL LOW (ref 36.0–46.0)
HCT: 25 % — ABNORMAL LOW (ref 36.0–46.0)
Hemoglobin: 10.9 g/dL — ABNORMAL LOW (ref 12.0–15.0)
Hemoglobin: 6.8 g/dL — CL (ref 12.0–15.0)
Hemoglobin: 6.5 g/dL — CL (ref 12.0–15.0)
Hemoglobin: 8.5 g/dL — ABNORMAL LOW (ref 12.0–15.0)
Hemoglobin: 8.5 g/dL — ABNORMAL LOW (ref 12.0–15.0)
Hemoglobin: 9.9 g/dL — ABNORMAL LOW (ref 12.0–15.0)
Hemoglobin: 8.8 g/dL — ABNORMAL LOW (ref 12.0–15.0)
Hemoglobin: 8.2 g/dL — ABNORMAL LOW (ref 12.0–15.0)
Hemoglobin: 8.5 g/dL — ABNORMAL LOW (ref 12.0–15.0)
O2 Saturation: 100 %
O2 Saturation: 100 %
O2 Saturation: 100 %
O2 Saturation: 100 %
O2 Saturation: 100 %
O2 Saturation: 99 %
O2 Saturation: 99 %
O2 Saturation: 99 %
O2 Saturation: 99 %
Patient temperature: 35.6
Patient temperature: 36
Patient temperature: 36.3
Patient temperature: 36.3
Potassium: 4.3 mmol/L (ref 3.5–5.1)
Potassium: 3.8 mmol/L (ref 3.5–5.1)
Potassium: 3.9 mmol/L (ref 3.5–5.1)
Potassium: 4 mmol/L (ref 3.5–5.1)
Potassium: 4.8 mmol/L (ref 3.5–5.1)
Potassium: 3.6 mmol/L (ref 3.5–5.1)
Potassium: 5.4 mmol/L — ABNORMAL HIGH (ref 3.5–5.1)
Potassium: 4.6 mmol/L (ref 3.5–5.1)
Potassium: 4.6 mmol/L (ref 3.5–5.1)
Sodium: 134 mmol/L — ABNORMAL LOW (ref 135–145)
Sodium: 134 mmol/L — ABNORMAL LOW (ref 135–145)
Sodium: 136 mmol/L (ref 135–145)
Sodium: 135 mmol/L (ref 135–145)
Sodium: 137 mmol/L (ref 135–145)
Sodium: 138 mmol/L (ref 135–145)
Sodium: 137 mmol/L (ref 135–145)
Sodium: 136 mmol/L (ref 135–145)
Sodium: 136 mmol/L (ref 135–145)
TCO2: 28 mmol/L (ref 22–32)
TCO2: 29 mmol/L (ref 22–32)
TCO2: 29 mmol/L (ref 22–32)
TCO2: 25 mmol/L (ref 22–32)
TCO2: 27 mmol/L (ref 22–32)
TCO2: 27 mmol/L (ref 22–32)
TCO2: 27 mmol/L (ref 22–32)
TCO2: 24 mmol/L (ref 22–32)
TCO2: 26 mmol/L (ref 22–32)
pCO2 arterial: 47.7 mmHg (ref 32–48)
pCO2 arterial: 33 mmHg (ref 32–48)
pCO2 arterial: 37.7 mmHg (ref 32–48)
pCO2 arterial: 34 mmHg (ref 32–48)
pCO2 arterial: 35.4 mmHg (ref 32–48)
pCO2 arterial: 42.7 mmHg (ref 32–48)
pCO2 arterial: 54 mmHg — ABNORMAL HIGH (ref 32–48)
pCO2 arterial: 41.3 mmHg (ref 32–48)
pCO2 arterial: 50.1 mmHg — ABNORMAL HIGH (ref 32–48)
pH, Arterial: 7.36 (ref 7.35–7.45)
pH, Arterial: 7.531 — ABNORMAL HIGH (ref 7.35–7.45)
pH, Arterial: 7.472 — ABNORMAL HIGH (ref 7.35–7.45)
pH, Arterial: 7.455 — ABNORMAL HIGH (ref 7.35–7.45)
pH, Arterial: 7.469 — ABNORMAL HIGH (ref 7.35–7.45)
pH, Arterial: 7.374 (ref 7.35–7.45)
pH, Arterial: 7.274 — ABNORMAL LOW (ref 7.35–7.45)
pH, Arterial: 7.347 — ABNORMAL LOW (ref 7.35–7.45)
pH, Arterial: 7.292 — ABNORMAL LOW (ref 7.35–7.45)
pO2, Arterial: 455 mmHg — ABNORMAL HIGH (ref 83–108)
pO2, Arterial: 416 mmHg — ABNORMAL HIGH (ref 83–108)
pO2, Arterial: 412 mmHg — ABNORMAL HIGH (ref 83–108)
pO2, Arterial: 247 mmHg — ABNORMAL HIGH (ref 83–108)
pO2, Arterial: 414 mmHg — ABNORMAL HIGH (ref 83–108)
pO2, Arterial: 158 mmHg — ABNORMAL HIGH (ref 83–108)
pO2, Arterial: 130 mmHg — ABNORMAL HIGH (ref 83–108)
pO2, Arterial: 172 mmHg — ABNORMAL HIGH (ref 83–108)
pO2, Arterial: 147 mmHg — ABNORMAL HIGH (ref 83–108)

## 2022-01-18 LAB — POCT I-STAT, CHEM 8
BUN: 11 mg/dL (ref 8–23)
BUN: 10 mg/dL (ref 8–23)
BUN: 8 mg/dL (ref 8–23)
BUN: 8 mg/dL (ref 8–23)
BUN: 9 mg/dL (ref 8–23)
Calcium, Ion: 1.23 mmol/L (ref 1.15–1.40)
Calcium, Ion: 1.2 mmol/L (ref 1.15–1.40)
Calcium, Ion: 0.96 mmol/L — ABNORMAL LOW (ref 1.15–1.40)
Calcium, Ion: 0.86 mmol/L — CL (ref 1.15–1.40)
Calcium, Ion: 0.91 mmol/L — ABNORMAL LOW (ref 1.15–1.40)
Chloride: 99 mmol/L (ref 98–111)
Chloride: 97 mmol/L — ABNORMAL LOW (ref 98–111)
Chloride: 98 mmol/L (ref 98–111)
Chloride: 100 mmol/L (ref 98–111)
Chloride: 98 mmol/L (ref 98–111)
Creatinine, Ser: 0.8 mg/dL (ref 0.44–1.00)
Creatinine, Ser: 0.6 mg/dL (ref 0.44–1.00)
Creatinine, Ser: 0.6 mg/dL (ref 0.44–1.00)
Creatinine, Ser: 0.5 mg/dL (ref 0.44–1.00)
Creatinine, Ser: 0.6 mg/dL (ref 0.44–1.00)
Glucose, Bld: 108 mg/dL — ABNORMAL HIGH (ref 70–99)
Glucose, Bld: 184 mg/dL — ABNORMAL HIGH (ref 70–99)
Glucose, Bld: 123 mg/dL — ABNORMAL HIGH (ref 70–99)
Glucose, Bld: 106 mg/dL — ABNORMAL HIGH (ref 70–99)
Glucose, Bld: 132 mg/dL — ABNORMAL HIGH (ref 70–99)
HCT: 30 % — ABNORMAL LOW (ref 36.0–46.0)
HCT: 27 % — ABNORMAL LOW (ref 36.0–46.0)
HCT: 21 % — ABNORMAL LOW (ref 36.0–46.0)
HCT: 25 % — ABNORMAL LOW (ref 36.0–46.0)
HCT: 26 % — ABNORMAL LOW (ref 36.0–46.0)
Hemoglobin: 10.2 g/dL — ABNORMAL LOW (ref 12.0–15.0)
Hemoglobin: 9.2 g/dL — ABNORMAL LOW (ref 12.0–15.0)
Hemoglobin: 7.1 g/dL — ABNORMAL LOW (ref 12.0–15.0)
Hemoglobin: 8.5 g/dL — ABNORMAL LOW (ref 12.0–15.0)
Hemoglobin: 8.8 g/dL — ABNORMAL LOW (ref 12.0–15.0)
Potassium: 4.4 mmol/L (ref 3.5–5.1)
Potassium: 3.8 mmol/L (ref 3.5–5.1)
Potassium: 3.9 mmol/L (ref 3.5–5.1)
Potassium: 4 mmol/L (ref 3.5–5.1)
Potassium: 4.8 mmol/L (ref 3.5–5.1)
Sodium: 134 mmol/L — ABNORMAL LOW (ref 135–145)
Sodium: 132 mmol/L — ABNORMAL LOW (ref 135–145)
Sodium: 134 mmol/L — ABNORMAL LOW (ref 135–145)
Sodium: 135 mmol/L (ref 135–145)
Sodium: 136 mmol/L (ref 135–145)
TCO2: 30 mmol/L (ref 22–32)
TCO2: 25 mmol/L (ref 22–32)
TCO2: 26 mmol/L (ref 22–32)
TCO2: 25 mmol/L (ref 22–32)
TCO2: 28 mmol/L (ref 22–32)

## 2022-01-18 LAB — CBC
HCT: 29 % — ABNORMAL LOW (ref 36.0–46.0)
HCT: 27.2 % — ABNORMAL LOW (ref 36.0–46.0)
Hemoglobin: 10.3 g/dL — ABNORMAL LOW (ref 12.0–15.0)
Hemoglobin: 9.5 g/dL — ABNORMAL LOW (ref 12.0–15.0)
MCH: 32.2 pg (ref 26.0–34.0)
MCH: 32 pg (ref 26.0–34.0)
MCHC: 35.5 g/dL (ref 30.0–36.0)
MCHC: 34.9 g/dL (ref 30.0–36.0)
MCV: 90.6 fL (ref 80.0–100.0)
MCV: 91.6 fL (ref 80.0–100.0)
Platelets: 103 10*3/uL — ABNORMAL LOW (ref 150–400)
Platelets: 93 10*3/uL — ABNORMAL LOW (ref 150–400)
RBC: 3.2 MIL/uL — ABNORMAL LOW (ref 3.87–5.11)
RBC: 2.97 MIL/uL — ABNORMAL LOW (ref 3.87–5.11)
RDW: 14.1 % (ref 11.5–15.5)
RDW: 15 % (ref 11.5–15.5)
WBC: 13.3 10*3/uL — ABNORMAL HIGH (ref 4.0–10.5)
WBC: 9.5 10*3/uL (ref 4.0–10.5)
nRBC: 0 % (ref 0.0–0.2)
nRBC: 0 % (ref 0.0–0.2)

## 2022-01-18 LAB — ECHO INTRAOPERATIVE TEE
AR max vel: 0.94 cm2
AV Area VTI: 0.96 cm2
AV Area mean vel: 1.02 cm2
AV Mean grad: 19 mmHg
AV Peak grad: 31.5 mmHg
Ao pk vel: 2.81 m/s
Height: 60 in
Weight: 1760 oz

## 2022-01-18 LAB — POCT I-STAT EG7
Acid-Base Excess: 2 mmol/L (ref 0.0–2.0)
Bicarbonate: 25.8 mmol/L (ref 20.0–28.0)
Calcium, Ion: 1.02 mmol/L — ABNORMAL LOW (ref 1.15–1.40)
HCT: 22 % — ABNORMAL LOW (ref 36.0–46.0)
Hemoglobin: 7.5 g/dL — ABNORMAL LOW (ref 12.0–15.0)
O2 Saturation: 72 %
Potassium: 3.7 mmol/L (ref 3.5–5.1)
Sodium: 136 mmol/L (ref 135–145)
TCO2: 27 mmol/L (ref 22–32)
pCO2, Ven: 37.3 mmHg — ABNORMAL LOW (ref 44–60)
pH, Ven: 7.448 — ABNORMAL HIGH (ref 7.25–7.43)
pO2, Ven: 36 mmHg (ref 32–45)

## 2022-01-18 LAB — GLUCOSE, CAPILLARY
Glucose-Capillary: 73 mg/dL (ref 70–99)
Glucose-Capillary: 97 mg/dL (ref 70–99)
Glucose-Capillary: 110 mg/dL — ABNORMAL HIGH (ref 70–99)
Glucose-Capillary: 111 mg/dL — ABNORMAL HIGH (ref 70–99)
Glucose-Capillary: 113 mg/dL — ABNORMAL HIGH (ref 70–99)
Glucose-Capillary: 105 mg/dL — ABNORMAL HIGH (ref 70–99)
Glucose-Capillary: 135 mg/dL — ABNORMAL HIGH (ref 70–99)
Glucose-Capillary: 130 mg/dL — ABNORMAL HIGH (ref 70–99)
Glucose-Capillary: 120 mg/dL — ABNORMAL HIGH (ref 70–99)

## 2022-01-18 LAB — HEMOGLOBIN AND HEMATOCRIT, BLOOD
HCT: 25.7 % — ABNORMAL LOW (ref 36.0–46.0)
Hemoglobin: 9 g/dL — ABNORMAL LOW (ref 12.0–15.0)

## 2022-01-18 LAB — BASIC METABOLIC PANEL
Anion gap: 6 (ref 5–15)
BUN: 9 mg/dL (ref 8–23)
CO2: 23 mmol/L (ref 22–32)
Calcium: 7.1 mg/dL — ABNORMAL LOW (ref 8.9–10.3)
Chloride: 105 mmol/L (ref 98–111)
Creatinine, Ser: 0.76 mg/dL (ref 0.44–1.00)
GFR, Estimated: >60 mL/min (ref >60)
Glucose, Bld: 108 mg/dL — ABNORMAL HIGH (ref 70–99)
Potassium: 5.2 mmol/L — ABNORMAL HIGH (ref 3.5–5.1)
Sodium: 134 mmol/L — ABNORMAL LOW (ref 135–145)

## 2022-01-18 LAB — PROTIME-INR
INR: 1.6 — ABNORMAL HIGH (ref 0.8–1.2)
Prothrombin Time: 18.6 seconds — ABNORMAL HIGH (ref 11.4–15.2)

## 2022-01-18 LAB — MAGNESIUM: Magnesium: 3.1 mg/dL — ABNORMAL HIGH (ref 1.7–2.4)

## 2022-01-18 LAB — PREPARE RBC (CROSSMATCH)

## 2022-01-18 LAB — PLATELET COUNT: Platelets: 129 10*3/uL — ABNORMAL LOW (ref 150–400)

## 2022-01-18 LAB — APTT: aPTT: 41 seconds — ABNORMAL HIGH (ref 24–36)

## 2022-01-18 LAB — FIBRINOGEN: Fibrinogen: 129 mg/dL — ABNORMAL LOW (ref 210–475)

## 2022-01-18 SURGERY — REPLACEMENT, AORTIC VALVE, OPEN
Anesthesia: General | Site: Chest

## 2022-01-18 MED ORDER — LACTATED RINGERS IV SOLN: INTRAVENOUS | Status: DC | PRN

## 2022-01-18 MED ORDER — ACETAMINOPHEN 500 MG PO TABS
1000.0000 mg | ORAL_TABLET | Freq: Four times a day (QID) | ORAL | Status: DC
  Administered 2022-01-19 – 2022-01-22 (×12): 1000 mg via ORAL
  Filled 2022-01-18 (×12): qty 2

## 2022-01-18 MED ORDER — MIDAZOLAM HCL (PF) 10 MG/2ML IJ SOLN
INTRAMUSCULAR | Status: AC
  Filled 2022-01-18: qty 2

## 2022-01-18 MED ORDER — CHLORHEXIDINE GLUCONATE 0.12 % MT SOLN
15.0000 mL | OROMUCOSAL | Status: AC
  Administered 2022-01-18: 15 mL via OROMUCOSAL

## 2022-01-18 MED ORDER — SODIUM CHLORIDE 0.9% IV SOLUTION: Freq: Once | INTRAVENOUS | Status: AC

## 2022-01-18 MED ORDER — METOPROLOL TARTRATE 12.5 MG HALF TABLET
12.5000 mg | ORAL_TABLET | Freq: Once | ORAL | Status: AC
  Administered 2022-01-18: 12.5 mg via ORAL
  Filled 2022-01-18: qty 1

## 2022-01-18 MED ORDER — PANTOPRAZOLE SODIUM 40 MG PO TBEC
40.0000 mg | DELAYED_RELEASE_TABLET | Freq: Every day | ORAL | Status: DC
  Administered 2022-01-20 – 2022-01-22 (×3): 40 mg via ORAL
  Filled 2022-01-18 (×3): qty 1

## 2022-01-18 MED ORDER — VANCOMYCIN HCL IN DEXTROSE 1-5 GM/200ML-% IV SOLN
1000.0000 mg | Freq: Once | INTRAVENOUS | Status: AC
  Administered 2022-01-18: 1000 mg via INTRAVENOUS
  Filled 2022-01-18: qty 200

## 2022-01-18 MED ORDER — METOPROLOL TARTRATE 5 MG/5ML IV SOLN: 2.5000 mg | INTRAVENOUS | Status: DC | PRN

## 2022-01-18 MED ORDER — FENTANYL CITRATE (PF) 250 MCG/5ML IJ SOLN
INTRAMUSCULAR | Status: AC
  Filled 2022-01-18: qty 5

## 2022-01-18 MED ORDER — THROMBIN 20000 UNITS EX SOLR
CUTANEOUS | Status: DC | PRN
  Administered 2022-01-18 (×3): 4 mL via TOPICAL

## 2022-01-18 MED ORDER — SODIUM CHLORIDE 0.9% FLUSH
3.0000 mL | Freq: Two times a day (BID) | INTRAVENOUS | Status: DC
  Administered 2022-01-19 – 2022-01-22 (×6): 3 mL via INTRAVENOUS

## 2022-01-18 MED ORDER — THROMBIN (RECOMBINANT) 20000 UNITS EX SOLR
CUTANEOUS | Status: AC
  Filled 2022-01-18: qty 20000

## 2022-01-18 MED ORDER — ALBUMIN HUMAN 5 % IV SOLN
250.0000 mL | INTRAVENOUS | Status: AC | PRN
  Administered 2022-01-18 (×3): 12.5 g via INTRAVENOUS
  Filled 2022-01-18: qty 250

## 2022-01-18 MED ORDER — ACETAMINOPHEN 160 MG/5ML PO SOLN: 650.0000 mg | Freq: Once | ORAL | Status: AC

## 2022-01-18 MED ORDER — OXYCODONE HCL 5 MG PO TABS
5.0000 mg | ORAL_TABLET | ORAL | Status: DC | PRN
  Administered 2022-01-18 – 2022-01-19 (×4): 10 mg via ORAL
  Filled 2022-01-18 (×5): qty 2

## 2022-01-18 MED ORDER — ACETAMINOPHEN 650 MG RE SUPP
650.0000 mg | Freq: Once | RECTAL | Status: AC
  Administered 2022-01-18: 650 mg via RECTAL

## 2022-01-18 MED ORDER — PROTAMINE SULFATE 10 MG/ML IV SOLN
INTRAVENOUS | Status: AC
  Filled 2022-01-18: qty 25

## 2022-01-18 MED ORDER — INSULIN REGULAR(HUMAN) IN NACL 100-0.9 UT/100ML-% IV SOLN: INTRAVENOUS | Status: DC

## 2022-01-18 MED ORDER — CYCLOSPORINE 0.05 % OP EMUL
1.0000 [drp] | Freq: Two times a day (BID) | OPHTHALMIC | Status: DC
  Administered 2022-01-18 – 2022-01-22 (×9): 1 [drp] via OPHTHALMIC
  Filled 2022-01-18 (×10): qty 30

## 2022-01-18 MED ORDER — MIDAZOLAM HCL (PF) 5 MG/ML IJ SOLN
INTRAMUSCULAR | Status: DC | PRN
Start: 2022-01-18 — End: 2022-01-18
  Administered 2022-01-18: 1 mg via INTRAVENOUS
  Administered 2022-01-18: 2 mg via INTRAVENOUS
  Administered 2022-01-18: 5 mg via INTRAVENOUS
  Administered 2022-01-18: 2 mg via INTRAVENOUS

## 2022-01-18 MED ORDER — NITROGLYCERIN IN D5W 200-5 MCG/ML-% IV SOLN: 0.0000 ug/min | INTRAVENOUS | Status: DC

## 2022-01-18 MED ORDER — CHLORHEXIDINE GLUCONATE 0.12 % MT SOLN
15.0000 mL | Freq: Once | OROMUCOSAL | Status: AC
  Administered 2022-01-18: 15 mL via OROMUCOSAL
  Filled 2022-01-18: qty 15

## 2022-01-18 MED ORDER — METOPROLOL TARTRATE 12.5 MG HALF TABLET
12.5000 mg | ORAL_TABLET | Freq: Two times a day (BID) | ORAL | Status: DC
  Administered 2022-01-19 – 2022-01-20 (×4): 12.5 mg via ORAL
  Filled 2022-01-18 (×4): qty 1

## 2022-01-18 MED ORDER — ONDANSETRON HCL 4 MG/2ML IJ SOLN
INTRAMUSCULAR | Status: DC | PRN
  Administered 2022-01-18: 4 mg via INTRAVENOUS

## 2022-01-18 MED ORDER — SODIUM CHLORIDE 0.9% FLUSH: 3.0000 mL | INTRAVENOUS | Status: DC | PRN

## 2022-01-18 MED ORDER — FAMOTIDINE IN NACL 20-0.9 MG/50ML-% IV SOLN
20.0000 mg | Freq: Two times a day (BID) | INTRAVENOUS | Status: DC
  Administered 2022-01-18: 20 mg via INTRAVENOUS
  Filled 2022-01-18: qty 50

## 2022-01-18 MED ORDER — NICOTINE 21 MG/24HR TD PT24
21.0000 mg | MEDICATED_PATCH | Freq: Every day | TRANSDERMAL | Status: DC
  Administered 2022-01-19 – 2022-01-21 (×3): 21 mg via TRANSDERMAL
  Filled 2022-01-18 (×3): qty 1

## 2022-01-18 MED ORDER — PHENYLEPHRINE 40 MCG/ML (10ML) SYRINGE FOR IV PUSH (FOR BLOOD PRESSURE SUPPORT)
PREFILLED_SYRINGE | INTRAVENOUS | Status: DC | PRN
  Administered 2022-01-18: 40 ug via INTRAVENOUS
  Administered 2022-01-18 (×4): 80 ug via INTRAVENOUS

## 2022-01-18 MED ORDER — SODIUM CHLORIDE 0.9 % IV SOLN
250.0000 mL | INTRAVENOUS | Status: DC
  Administered 2022-01-19: 250 mL via INTRAVENOUS

## 2022-01-18 MED ORDER — ~~LOC~~ CARDIAC SURGERY, PATIENT & FAMILY EDUCATION
Freq: Once | Status: DC
  Filled 2022-01-18: qty 1

## 2022-01-18 MED ORDER — PROTAMINE SULFATE 10 MG/ML IV SOLN
INTRAVENOUS | Status: DC | PRN
  Administered 2022-01-18: 50 mg via INTRAVENOUS
  Administered 2022-01-18: 10 mg via INTRAVENOUS
  Administered 2022-01-18: 50 mg via INTRAVENOUS
  Administered 2022-01-18: 20 mg via INTRAVENOUS
  Administered 2022-01-18: 50 mg via INTRAVENOUS

## 2022-01-18 MED ORDER — ASPIRIN 81 MG PO CHEW: 324.0000 mg | CHEWABLE_TABLET | Freq: Every day | ORAL | Status: DC

## 2022-01-18 MED ORDER — 0.9 % SODIUM CHLORIDE (POUR BTL) OPTIME
TOPICAL | Status: DC | PRN
  Administered 2022-01-18: 6000 mL

## 2022-01-18 MED ORDER — ONDANSETRON HCL 4 MG/2ML IJ SOLN
4.0000 mg | Freq: Four times a day (QID) | INTRAMUSCULAR | Status: DC | PRN
  Administered 2022-01-18 – 2022-01-21 (×4): 4 mg via INTRAVENOUS
  Filled 2022-01-18 (×4): qty 2

## 2022-01-18 MED ORDER — ALBUMIN HUMAN 5 % IV SOLN: INTRAVENOUS | Status: DC | PRN

## 2022-01-18 MED ORDER — PHENYLEPHRINE HCL-NACL 20-0.9 MG/250ML-% IV SOLN: 0.0000 ug/min | INTRAVENOUS | Status: DC

## 2022-01-18 MED ORDER — PROPOFOL 10 MG/ML IV BOLUS
INTRAVENOUS | Status: DC | PRN
  Administered 2022-01-18: 50 mg via INTRAVENOUS

## 2022-01-18 MED ORDER — DEXTROSE 50 % IV SOLN: 0.0000 mL | INTRAVENOUS | Status: DC | PRN

## 2022-01-18 MED ORDER — SODIUM CHLORIDE 0.9% FLUSH: 10.0000 mL | INTRAVENOUS | Status: DC | PRN

## 2022-01-18 MED ORDER — HEMOSTATIC AGENTS (NO CHARGE) OPTIME
TOPICAL | Status: DC | PRN
  Administered 2022-01-18: 1 via TOPICAL

## 2022-01-18 MED ORDER — ACETAMINOPHEN 160 MG/5ML PO SOLN: 1000.0000 mg | Freq: Four times a day (QID) | ORAL | Status: DC

## 2022-01-18 MED ORDER — POTASSIUM CHLORIDE 10 MEQ/50ML IV SOLN
10.0000 meq | INTRAVENOUS | Status: AC
  Administered 2022-01-18 (×3): 10 meq via INTRAVENOUS

## 2022-01-18 MED ORDER — BISACODYL 5 MG PO TBEC
10.0000 mg | DELAYED_RELEASE_TABLET | Freq: Every day | ORAL | Status: DC
  Administered 2022-01-19 – 2022-01-21 (×3): 10 mg via ORAL
  Filled 2022-01-18 (×3): qty 2

## 2022-01-18 MED ORDER — BISACODYL 10 MG RE SUPP: 10.0000 mg | Freq: Every day | RECTAL | Status: DC

## 2022-01-18 MED ORDER — TRAMADOL HCL 50 MG PO TABS
50.0000 mg | ORAL_TABLET | ORAL | Status: DC | PRN
  Administered 2022-01-19 – 2022-01-20 (×2): 100 mg via ORAL
  Filled 2022-01-18 (×3): qty 2

## 2022-01-18 MED ORDER — ROCURONIUM BROMIDE 10 MG/ML (PF) SYRINGE
PREFILLED_SYRINGE | INTRAVENOUS | Status: DC | PRN
  Administered 2022-01-18: 20 mg via INTRAVENOUS
  Administered 2022-01-18: 30 mg via INTRAVENOUS
  Administered 2022-01-18: 100 mg via INTRAVENOUS

## 2022-01-18 MED ORDER — CHLORHEXIDINE GLUCONATE 4 % EX LIQD: 30.0000 mL | CUTANEOUS | Status: DC

## 2022-01-18 MED ORDER — ASPIRIN EC 325 MG PO TBEC
325.0000 mg | DELAYED_RELEASE_TABLET | Freq: Every day | ORAL | Status: DC
  Administered 2022-01-19 – 2022-01-22 (×4): 325 mg via ORAL
  Filled 2022-01-18 (×4): qty 1

## 2022-01-18 MED ORDER — HEPARIN SODIUM (PORCINE) 1000 UNIT/ML IJ SOLN
INTRAMUSCULAR | Status: DC | PRN
  Administered 2022-01-18: 18000 [IU] via INTRAVENOUS

## 2022-01-18 MED ORDER — LACTATED RINGERS IV SOLN: 500.0000 mL | Freq: Once | INTRAVENOUS | Status: DC | PRN

## 2022-01-18 MED ORDER — SODIUM CHLORIDE 0.9 % IV SOLN: INTRAVENOUS | Status: DC

## 2022-01-18 MED ORDER — LEVOFLOXACIN IN D5W 750 MG/150ML IV SOLN
750.0000 mg | INTRAVENOUS | Status: AC
  Administered 2022-01-19: 750 mg via INTRAVENOUS
  Filled 2022-01-18: qty 150

## 2022-01-18 MED ORDER — CHLORHEXIDINE GLUCONATE CLOTH 2 % EX PADS
6.0000 | MEDICATED_PAD | Freq: Every day | CUTANEOUS | Status: DC
  Administered 2022-01-18 – 2022-01-22 (×4): 6 via TOPICAL

## 2022-01-18 MED ORDER — SODIUM CHLORIDE 0.9% FLUSH
10.0000 mL | Freq: Two times a day (BID) | INTRAVENOUS | Status: DC
  Administered 2022-01-18 – 2022-01-21 (×7): 10 mL

## 2022-01-18 MED ORDER — MAGNESIUM SULFATE 4 GM/100ML IV SOLN
4.0000 g | Freq: Once | INTRAVENOUS | Status: AC
  Administered 2022-01-18: 4 g via INTRAVENOUS
  Filled 2022-01-18: qty 100

## 2022-01-18 MED ORDER — SODIUM CHLORIDE 0.45 % IV SOLN: INTRAVENOUS | Status: DC | PRN

## 2022-01-18 MED ORDER — DEXMEDETOMIDINE HCL IN NACL 400 MCG/100ML IV SOLN: 0.0000 ug/kg/h | INTRAVENOUS | Status: DC

## 2022-01-18 MED ORDER — FENTANYL CITRATE (PF) 250 MCG/5ML IJ SOLN
INTRAMUSCULAR | Status: DC | PRN
  Administered 2022-01-18: 100 ug via INTRAVENOUS
  Administered 2022-01-18: 150 ug via INTRAVENOUS
  Administered 2022-01-18: 100 ug via INTRAVENOUS
  Administered 2022-01-18: 500 ug via INTRAVENOUS
  Administered 2022-01-18: 150 ug via INTRAVENOUS

## 2022-01-18 MED ORDER — CHLORHEXIDINE GLUCONATE 0.12% ORAL RINSE (MEDLINE KIT)
15.0000 mL | Freq: Two times a day (BID) | OROMUCOSAL | Status: DC
  Administered 2022-01-18 – 2022-01-20 (×4): 15 mL via OROMUCOSAL

## 2022-01-18 MED ORDER — LACTATED RINGERS IV SOLN: INTRAVENOUS | Status: DC

## 2022-01-18 MED ORDER — THROMBIN 20000 UNITS EX KIT
PACK | CUTANEOUS | Status: DC | PRN
  Administered 2022-01-18: 20000 [IU] via TOPICAL

## 2022-01-18 MED ORDER — ACETAMINOPHEN 500 MG PO TABS
1000.0000 mg | ORAL_TABLET | Freq: Once | ORAL | Status: AC
  Administered 2022-01-18: 1000 mg via ORAL
  Filled 2022-01-18: qty 2

## 2022-01-18 MED ORDER — DULOXETINE HCL 60 MG PO CPEP
60.0000 mg | ORAL_CAPSULE | Freq: Every day | ORAL | Status: DC
  Administered 2022-01-19 – 2022-01-22 (×4): 60 mg via ORAL
  Filled 2022-01-18 (×4): qty 1

## 2022-01-18 MED ORDER — HEPARIN SODIUM (PORCINE) 1000 UNIT/ML IJ SOLN
INTRAMUSCULAR | Status: AC
  Filled 2022-01-18: qty 1

## 2022-01-18 MED ORDER — ORAL CARE MOUTH RINSE
15.0000 mL | OROMUCOSAL | Status: DC
  Administered 2022-01-18 (×2): 15 mL via OROMUCOSAL

## 2022-01-18 MED ORDER — METOPROLOL TARTRATE 25 MG/10 ML ORAL SUSPENSION: 12.5000 mg | Freq: Two times a day (BID) | ORAL | Status: DC

## 2022-01-18 MED ORDER — FENTANYL CITRATE PF 50 MCG/ML IJ SOSY
50.0000 ug | PREFILLED_SYRINGE | INTRAMUSCULAR | Status: DC | PRN
  Administered 2022-01-18 (×2): 50 ug via INTRAVENOUS
  Administered 2022-01-19 (×2): 100 ug via INTRAVENOUS
  Filled 2022-01-18 (×2): qty 1
  Filled 2022-01-18 (×2): qty 2

## 2022-01-18 MED ORDER — PROPOFOL 10 MG/ML IV BOLUS
INTRAVENOUS | Status: AC
  Filled 2022-01-18: qty 20

## 2022-01-18 MED ORDER — MIDAZOLAM HCL 2 MG/2ML IJ SOLN: 2.0000 mg | INTRAMUSCULAR | Status: DC | PRN

## 2022-01-18 MED ORDER — DOCUSATE SODIUM 100 MG PO CAPS
200.0000 mg | ORAL_CAPSULE | Freq: Every day | ORAL | Status: DC
  Administered 2022-01-19 – 2022-01-21 (×3): 200 mg via ORAL
  Filled 2022-01-18 (×4): qty 2

## 2022-01-18 SURGICAL SUPPLY — 79 items
ADAPTER CARDIO PERF ANTE/RETRO (ADAPTER) ×3 IMPLANT
BLADE CLIPPER SURG (BLADE) ×3 IMPLANT
BLADE STERNUM SYSTEM 6 (BLADE) ×3 IMPLANT
CANISTER SUCT 3000ML PPV (MISCELLANEOUS) ×3 IMPLANT
CANNULA GUNDRY RCSP 15FR (MISCELLANEOUS) ×3 IMPLANT
CANNULA SUMP PERICARDIAL (CANNULA) ×1 IMPLANT
CATH HEART VENT LEFT (CATHETERS) ×2 IMPLANT
CATH ROBINSON RED A/P 18FR (CATHETERS) ×10 IMPLANT
CATH THORACIC 36FR (CATHETERS) ×3 IMPLANT
CATH THORACIC 36FR RT ANG (CATHETERS) ×3 IMPLANT
CNTNR URN SCR LID CUP LEK RST (MISCELLANEOUS) ×2 IMPLANT
CONT SPEC 4OZ STRL OR WHT (MISCELLANEOUS) ×3
CONTAINER PROTECT SURGISLUSH (MISCELLANEOUS) ×6 IMPLANT
COVER SURGICAL LIGHT HANDLE (MISCELLANEOUS) ×3 IMPLANT
DEVICE SUT CK QUICK LOAD INDV (Prosthesis & Implant Heart) ×3 IMPLANT
DRAIN CONNECTOR BLAKE 1:1 (MISCELLANEOUS) ×1 IMPLANT
DRAPE CARDIOVASCULAR INCISE (DRAPES) ×3
DRAPE SRG 135X102X78XABS (DRAPES) ×2 IMPLANT
DRAPE WARM FLUID 44X44 (DRAPES) ×3 IMPLANT
DRSG COVADERM 4X14 (GAUZE/BANDAGES/DRESSINGS) ×3 IMPLANT
ELECT CAUTERY BLADE 6.4 (BLADE) ×3 IMPLANT
ELECT REM PT RETURN 9FT ADLT (ELECTROSURGICAL) ×6
ELECTRODE REM PT RTRN 9FT ADLT (ELECTROSURGICAL) ×4 IMPLANT
FELT TEFLON 1X6 (MISCELLANEOUS) ×5 IMPLANT
GAUZE SPONGE 4X4 12PLY STRL (GAUZE/BANDAGES/DRESSINGS) ×3 IMPLANT
GLOVE SURG ENC MOIS LTX SZ6 (GLOVE) ×2 IMPLANT
GLOVE SURG ENC MOIS LTX SZ6.5 (GLOVE) IMPLANT
GLOVE SURG ENC MOIS LTX SZ7 (GLOVE) IMPLANT
GLOVE SURG ENC MOIS LTX SZ7.5 (GLOVE) IMPLANT
GLOVE SURG MICRO LTX SZ6 (GLOVE) ×3 IMPLANT
GLOVE SURG MICRO LTX SZ7 (GLOVE) ×8 IMPLANT
GLOVE SURG MICRO LTX SZ7.5 (GLOVE) ×1 IMPLANT
GLOVE SURG POLYISO LF SZ6 (GLOVE) ×2 IMPLANT
GOWN STRL REUS W/ TWL LRG LVL3 (GOWN DISPOSABLE) ×8 IMPLANT
GOWN STRL REUS W/ TWL XL LVL3 (GOWN DISPOSABLE) ×2 IMPLANT
GOWN STRL REUS W/TWL LRG LVL3 (GOWN DISPOSABLE) ×21
GOWN STRL REUS W/TWL XL LVL3 (GOWN DISPOSABLE) ×3
HEMOSTAT POWDER SURGIFOAM 1G (HEMOSTASIS) ×9 IMPLANT
HEMOSTAT SURGICEL 2X14 (HEMOSTASIS) ×3 IMPLANT
KIT BASIN OR (CUSTOM PROCEDURE TRAY) ×3 IMPLANT
KIT CATH CPB BARTLE (MISCELLANEOUS) ×3 IMPLANT
KIT SUCTION CATH 14FR (SUCTIONS) ×3 IMPLANT
KIT SUT CK MINI COMBO 4X17 (Prosthesis & Implant Heart) ×1 IMPLANT
KIT TURNOVER KIT B (KITS) ×3 IMPLANT
LINE VENT (MISCELLANEOUS) ×1 IMPLANT
NS IRRIG 1000ML POUR BTL (IV SOLUTION) ×18 IMPLANT
PACK E OPEN HEART (SUTURE) ×3 IMPLANT
PACK OPEN HEART (CUSTOM PROCEDURE TRAY) ×3 IMPLANT
PAD ARMBOARD 7.5X6 YLW CONV (MISCELLANEOUS) ×6 IMPLANT
POSITIONER HEAD DONUT 9IN (MISCELLANEOUS) ×3 IMPLANT
SET MPS 3-ND DEL (MISCELLANEOUS) ×1 IMPLANT
SPONGE T-LAP 18X18 ~~LOC~~+RFID (SPONGE) ×12 IMPLANT
SPONGE T-LAP 4X18 ~~LOC~~+RFID (SPONGE) ×3 IMPLANT
SUT BONE WAX W31G (SUTURE) ×3 IMPLANT
SUT EB EXC GRN/WHT 2-0 V-5 (SUTURE) ×6 IMPLANT
SUT ETHIBON EXCEL 2-0 V-5 (SUTURE) ×8 IMPLANT
SUT ETHIBOND 2 0 SH (SUTURE) ×9
SUT ETHIBOND 2 0 SH 36X2 (SUTURE) IMPLANT
SUT ETHIBOND V-5 VALVE (SUTURE) ×8 IMPLANT
SUT PROLENE 3 0 SH DA (SUTURE) IMPLANT
SUT PROLENE 3 0 SH1 36 (SUTURE) ×3 IMPLANT
SUT PROLENE 4 0 RB 1 (SUTURE) ×27
SUT PROLENE 4-0 RB1 .5 CRCL 36 (SUTURE) ×6 IMPLANT
SUT SILK  1 MH (SUTURE) ×3
SUT SILK 1 MH (SUTURE) IMPLANT
SUT STEEL 6MS V (SUTURE) ×2 IMPLANT
SUT VIC AB 1 CTX 36 (SUTURE) ×9
SUT VIC AB 1 CTX36XBRD ANBCTR (SUTURE) ×4 IMPLANT
SYSTEM SAHARA CHEST DRAIN ATS (WOUND CARE) ×3 IMPLANT
TAPE CLOTH SURG 4X10 WHT LF (GAUZE/BANDAGES/DRESSINGS) ×1 IMPLANT
TAPE PAPER 2X10 WHT MICROPORE (GAUZE/BANDAGES/DRESSINGS) ×1 IMPLANT
TOWEL GREEN STERILE (TOWEL DISPOSABLE) ×3 IMPLANT
TOWEL GREEN STERILE FF (TOWEL DISPOSABLE) ×3 IMPLANT
TRAY FOLEY SLVR 16FR TEMP STAT (SET/KITS/TRAYS/PACK) ×3 IMPLANT
UNDERPAD 30X36 HEAVY ABSORB (UNDERPADS AND DIAPERS) ×3 IMPLANT
VALVE AORTIC SZ19 INSP/RESIL (Valve) ×1 IMPLANT
VENT LEFT HEART 12002 (CATHETERS) ×3
WATER STERILE IRR 1000ML POUR (IV SOLUTION) ×6 IMPLANT
YANKAUER SUCT BULB TIP NO VENT (SUCTIONS) ×1 IMPLANT

## 2022-01-18 NOTE — Procedures (Signed)
Extubation Procedure Note  Patient Details:   Name: Jessica Ramsey DOB: November 23, 1956 MRN: [id number]   Airway Documentation:   RT extubated patient per protocol to 4L [place]. NIF and VC did with patient with good effort. Patient got -30 NIF and 850 on VC. Patient did have cuff leak. No stridor noted and this time. RN at beside doing IS with patient.    Vent end date: 01/18/22 Vent end time: 2025   Evaluation  O2 sats: stable throughout Complications: No apparent complications Patient did tolerate procedure well. Bilateral Breath Sounds: Clear, Diminished   Yes  Kelle Darting 01/18/2022,2025

## 2022-01-18 NOTE — Transfer of Care (Signed)
Immediate Anesthesia Transfer of Care Note  Patient: Jessica Ramsey  Procedure(s) Performed: AORTIC VALVE REPLACEMENT (AVR) USING INSPIRIS VALVE SIZE 19MM (Chest) TRANSESOPHAGEAL ECHOCARDIOGRAM (TEE)  Patient Location: ICU  Anesthesia Type:General  Level of Consciousness: sedated and unresponsive  Airway & Oxygen Therapy: Patient remains intubated per anesthesia plan and Patient placed on Ventilator (see vital sign flow sheet for setting)  Post-op Assessment: Report given to RN and Post -op Vital signs reviewed and stable  Post vital signs: Reviewed and stable  Last Vitals:  Vitals Value Taken Time  BP    Temp 35.5 C 01/18/22 1238  Pulse    Resp 12 01/18/22 1238  SpO2 100 % 01/18/22 1238  Vitals shown include unvalidated device data.  Last Pain:  Vitals:   01/18/22 0619  TempSrc:   PainSc: 0-No pain         Complications: No notable events documented.

## 2022-01-18 NOTE — Anesthesia Postprocedure Evaluation (Signed)
Anesthesia Post Note  Patient: Jessica Ramsey  Procedure(s) Performed: AORTIC VALVE REPLACEMENT (AVR) USING INSPIRIS VALVE SIZE 19MM (Chest) TRANSESOPHAGEAL ECHOCARDIOGRAM (TEE)     Patient location during evaluation: SICU Anesthesia Type: General Level of consciousness: sedated Pain management: pain level controlled Vital Signs Assessment: post-procedure vital signs reviewed and stable Respiratory status: patient remains intubated per anesthesia plan Cardiovascular status: stable Postop Assessment: no apparent nausea or vomiting Anesthetic complications: no   No notable events documented.  Last Vitals:  Vitals:   01/18/22 1315 01/18/22 1330  BP:    Pulse:    Resp: 12 12  Temp: (!) 35.7 C (!) 35.8 C  SpO2: 100% 100%    Last Pain:  Vitals:   01/18/22 1300  TempSrc: Core  PainSc:                  Agnieszka Newhouse,W. EDMOND

## 2022-01-18 NOTE — Brief Op Note (Signed)
01/18/2022  12:00 PM  PATIENT:  Jessica Ramsey  66 y.o. female  PRE-OPERATIVE DIAGNOSIS:  SEVERE AORTIC STENOSIS  POST-OPERATIVE DIAGNOSIS:  SEVERE AORTIC STENOSIS  PROCEDURE:  Procedure(s): AORTIC VALVE REPLACEMENT (AVR) USING INSPIRIS VALVE SIZE 19MM (N/A) TRANSESOPHAGEAL ECHOCARDIOGRAM (TEE) (N/A)  SURGEON:  Surgeon(s) and Role:    * Bartle, Fernande Boyden, MD - Primary  PHYSICIAN ASSISTANT: Manan Olmo PA-C  ASSISTANTS: STAFF   ANESTHESIA:   general  EBL:  656 ml   BLOOD ADMINISTERED: 700 CC PRBC  DRAINS:  MEDIASTINAL CHEST TUBES    LOCAL MEDICATIONS USED:  NONE  SPECIMEN:  Source of Specimen:  AORTIC VALVE LEAFLETS  DISPOSITION OF SPECIMEN:  PATHOLOGY  COUNTS:  YES  TOURNIQUET:  * No tourniquets in log *  DICTATION: .Dragon Dictation  PLAN OF CARE: Admit to inpatient   PATIENT DISPOSITION:  ICU - intubated and hemodynamically stable.   Delay start of Pharmacological VTE agent (>24hrs) due to surgical blood loss or risk of bleeding: yes  COMPLICATIONS: NO KNOWN

## 2022-01-18 NOTE — Anesthesia Procedure Notes (Signed)
Central Venous Catheter Insertion Performed by: Roderic Palau, MD, anesthesiologist Start/End2/24/2023 6:45 AM, 01/18/2022 7:00 AM Patient location: Pre-op. Preanesthetic checklist: patient identified, IV checked, site marked, risks and benefits discussed, surgical consent, monitors and equipment checked, pre-op evaluation, timeout performed and anesthesia consent Hand hygiene performed  and maximum sterile barriers used  PA cath was placed.Swan type:thermodilution PA Cath depth:50 Procedure performed without using ultrasound guided technique. Attempts: 1 Patient tolerated the procedure well with no immediate complications.

## 2022-01-18 NOTE — Interval H&P Note (Signed)
History and Physical Interval Note:  01/18/2022 7:10 AM  Jessica Ramsey  has presented today for surgery, with the diagnosis of SEVERE AS.  The various methods of treatment have been discussed with the patient and family. After consideration of risks, benefits and other options for treatment, the patient has consented to  Procedure(s): AORTIC VALVE REPLACEMENT (AVR) (N/A) TRANSESOPHAGEAL ECHOCARDIOGRAM (TEE) (N/A) as a surgical intervention.  The patient's history has been reviewed, patient examined, no change in status, stable for surgery.  I have reviewed the patient's chart and labs.  Questions were answered to the patient's satisfaction.     Gaye Pollack

## 2022-01-18 NOTE — Anesthesia Procedure Notes (Signed)
Central Venous Catheter Insertion Performed by: Roderic Palau, MD, anesthesiologist Start/End2/24/2023 6:45 AM, 01/18/2022 7:00 AM Patient location: Pre-op. Preanesthetic checklist: patient identified, IV checked, site marked, risks and benefits discussed, surgical consent, monitors and equipment checked, pre-op evaluation, timeout performed and anesthesia consent Position: Trendelenburg Lidocaine 1% used for infiltration and patient sedated Hand hygiene performed , maximum sterile barriers used  and Seldinger technique used Catheter size: 8.5 Fr Total catheter length 10. Central line was placed.Sheath introducer Procedure performed using ultrasound guided technique. Ultrasound Notes:anatomy identified, needle tip was noted to be adjacent to the nerve/plexus identified, no ultrasound evidence of intravascular and/or intraneural injection and image(s) printed for medical record Attempts: 1 Following insertion, line sutured, dressing applied and Biopatch. Post procedure assessment: blood return through all ports, free fluid flow and no air  Patient tolerated the procedure well with no immediate complications.

## 2022-01-18 NOTE — Anesthesia Procedure Notes (Signed)
Arterial Line Insertion Start/End2/24/2023 6:50 AM, 01/18/2022 7:00 AM Performed by: CRNA  Patient location: Pre-op. Preanesthetic checklist: patient identified, IV checked, site marked, risks and benefits discussed, surgical consent, monitors and equipment checked, pre-op evaluation, timeout performed and anesthesia consent Lidocaine 1% used for infiltration Left, radial was placed Catheter size: 20 G Hand hygiene performed  and maximum sterile barriers used   Attempts: 1 Procedure performed without using ultrasound guided technique. Following insertion, dressing applied and Biopatch. Post procedure assessment: normal  Patient tolerated the procedure well with no immediate complications.

## 2022-01-18 NOTE — Progress Notes (Signed)
CT surgery PM rounds  Hemodynamically stable after AVR Unsuccessful attempts at weaning from ventilator due to sedation, now with hypothermia Heating device in place Neuro intact Chest tube output minimal  Blood pressure 109/72, pulse 80, temperature (!) 96.4 F (35.8 C), resp. rate 17, height 5' (1.524 m), weight 49.9 kg, last menstrual period 11/25/2001, SpO2 100 %.

## 2022-01-18 NOTE — Op Note (Signed)
CARDIOVASCULAR SURGERY OPERATIVE NOTE  01/18/2022 Jessica Ramsey [id number]  Surgeon:  Gaye Pollack, MD  First Assistant: Jadene Pierini,  PA-C:  An experienced assistant was required given the complexity of this surgery and the standard of surgical care. The assistant was needed for exposure, dissection, suctioning, retraction of delicate tissues and sutures, instrument exchange and for overall help during this procedure.    Preoperative Diagnosis:  Severe aortic stenosis and severe aortic insufficiency   Postoperative Diagnosis:  Same   Procedure:  Median Sternotomy Extracorporeal circulation 3.   Aortic valve replacement using a 19 mm Edwards INSPIRIS RESILIA Pericardial valve.  Anesthesia:  General Endotracheal   Clinical History/Surgical Indication:  This 66 year old woman has stage D, symptomatic, severe aortic stenosis and severe insufficiency with New York Heart Association class III symptoms of exertional fatigue and shortness of breath consistent with chronic diastolic congestive heart failure.  I have personally reviewed her echocardiogram, cardiac catheterization, and CTA studies.  Her aortic valve is not well visualized on her echo but the mean gradient is 30 mmHg and the valve appears moderately calcified and thickened with restricted leaflet mobility.  There is severe insufficiency.  Left ventricular systolic function is normal with an ejection fraction of 65 to 70% with normal LV dimensions and mild LVH.  Cardiac catheterization shows no coronary disease.  The mean gradient across aortic valve was 55 mmHg suggesting severe aortic stenosis.  I agree that aortic valve replacement is indicated in this patient for relief of her symptoms and to prevent progressive left ventricular dysfunction.  She is a small woman with a BSA of 1.46 and a BMI of 21.5.  Her aortic annulus is relatively small and proportional to her body size.  An Edwards transcatheter valve would be too large  for her annulus.  A small 23 mm Medtronic valve would fit but the sinus height and diameter are borderline.  I think she is a surgical candidate and her best long-term prognosis will be with open surgical aortic valve replacement using either a Resilia pericardial bioprosthesis or a porcine root.  I think this will give her the best long-term valve durability and if one of those valves deteriorates in the future and requires intervention then she could probably have valve in valve TAVR.  If a 23 mm Medtronic TAVR valve was inserted now and deteriorated it would make management much more difficult and likely require open surgical removal of the TAVR prosthesis and aortic root replacement.  I discussed all this with her and her daughter and my recommendation for open surgical aortic valve replacement.  They seem to understand  and are in full agreement.  Preparation:  The patient was seen in the preoperative holding area and the correct patient, correct operation were confirmed with the patient after reviewing the medical record and catheterization. The consent was signed by me. Preoperative antibiotics were given. A pulmonary arterial line and radial arterial line were placed by the anesthesia team. The patient was taken back to the operating room and positioned supine on the operating room table. After being placed under general endotracheal anesthesia by the anesthesia team a foley catheter was placed. The neck, chest, abdomen, and both legs were prepped with betadine soap and solution and draped in the usual sterile manner. A surgical time-out was taken and the correct patient and operative procedure were confirmed with the nursing and anesthesia staff.   Pre-bypass TEE:   Complete TEE assessment was performed by Dr. Suann Larry. This showed  normal LV systolic function. Moderate to severe AS with a mean gradient of 30 mm Hg, severe AI.    Post-bypass TEE:   Normal functioning prosthetic  aortic valve with no perivalvular leak or regurgitation through the valve. Left ventricular function preserved. Trivial mitral regurgitation.    Cardiopulmonary Bypass:  A median sternotomy was performed. The pericardium was opened in the midline. Right ventricular function appeared normal. The ascending aorta was of normal size and had no palpable plaque. There were no contraindications to aortic cannulation or cross-clamping. The patient was fully systemically heparinized and the ACT was maintained > 400 sec. The proximal aortic arch was cannulated with a 20 F aortic cannula for arterial inflow. Venous cannulation was performed via the right atrial appendage using a two-staged venous cannula. An antegrade cardioplegia/vent cannula was inserted into the mid-ascending aorta. A left ventricular vent was placed via the right superior pulmonary vein. A retrograde cardioplegia cannnula was placed into the coronary sinus via the right atrium. Aortic occlusion was performed with a single cross-clamp. Systemic cooling to 32 degrees Centigrade and topical cooling of the heart with iced saline were used. Cold KBC cardioplegia was used to induce diastolic arrest and then given at about 60 minute intervals throughout the period of arrest to maintain myocardial temperature at or below 10 degrees centigrade. A temperature probe was inserted into the interventricular septum and an insulating pad was placed in the pericardium. Carbon dioxide was insufflated into the pericardium at 5L/min throughout the procedure to minimize intracardiac air.   Aortic Valve Replacement:   A transverse aortotomy was performed 1 cm above the take-off of the right coronary artery. The native valve was tricuspid and appeared rheumatic with thickened leaflets that were fused at the commissures and minimal annular calcification. The ostia of the coronary arteries were in normal position and were not obstructed. The native valve leaflets were  excised and the annulus was decalcified with rongeurs. Care was taken to remove all particulate debris. The left ventricle was directly inspected for debris and then irrigated with ice saline solution. The annulus was sized and a size 19 mm INSPIRIS RESILIA pericardial valve was chosen. BSA was 1.45 so that was felt to be an adequate valve size. The model number was [id number] and the serial number was [id number]. While the valve was being prepared 2-0 Ethibond pledgeted horizontal mattress sutures with small pledgets were placed around the annulus with the pledgets in a sub-annular position. The sutures were placed through the sewing ring and the valve lowered into place. The sutures were tied using Cor-Knots. The valve seated nicely and the coronary ostia were not obstructed. The prosthetic valve leaflets moved normally and there was no sub-valvular obstruction. The aortotomy was closed using 4-0 Prolene suture in 2 layers with felt strips to reinforce the closure.  Completion:  The patient was rewarmed to 37 degrees Centigrade. A dose of warm retrograde reanimation cardioplegia was given. De-airing maneuvers were performed and the head placed in trendelenburg position. The crossclamp was removed with a time of 90 minutes. There was spontaneous return of sinus rhythm. The aortotomy was checked for hemostasis. Two temporary epicardial pacing wires were placed on the right atrium and two on the right ventricle. The left ventricular vent and retrograde cardioplegia cannulas were removed. The patient was weaned from CPB without difficulty on no inotropes. CPB time was 115 minutes. Cardiac output was 4 LPM. Heparin was fully reversed with protamine and the aortic and venous cannulas removed. Hemostasis was achieved.  Mediastinal and right pleural drainage tubes were placed. The sternum was closed with  #6 stainless steel wires. The fascia was closed with continuous # 1 vicryl suture. The subcutaneous tissue was closed with  2-0 vicryl continuous suture. The skin was closed with 3-0 vicryl subcuticular suture. All sponge, needle, and instrument counts were reported correct at the end of the case. Dry sterile dressings were placed over the incisions and around the chest tubes which were connected to pleurevac suction. The patient was then transported to the surgical intensive care unit in stable condition.

## 2022-01-19 ENCOUNTER — Inpatient Hospital Stay (HOSPITAL_COMMUNITY): Payer: PPO

## 2022-01-19 LAB — CBC
HCT: 28.8 % — ABNORMAL LOW (ref 36.0–46.0)
HCT: 25.8 % — ABNORMAL LOW (ref 36.0–46.0)
Hemoglobin: 9.7 g/dL — ABNORMAL LOW (ref 12.0–15.0)
Hemoglobin: 8.9 g/dL — ABNORMAL LOW (ref 12.0–15.0)
MCH: 31.2 pg (ref 26.0–34.0)
MCH: 31.7 pg (ref 26.0–34.0)
MCHC: 33.7 g/dL (ref 30.0–36.0)
MCHC: 34.5 g/dL (ref 30.0–36.0)
MCV: 92.6 fL (ref 80.0–100.0)
MCV: 91.8 fL (ref 80.0–100.0)
Platelets: 115 10*3/uL — ABNORMAL LOW (ref 150–400)
Platelets: UNDETERMINED 10*3/uL (ref 150–400)
RBC: 3.11 MIL/uL — ABNORMAL LOW (ref 3.87–5.11)
RBC: 2.81 MIL/uL — ABNORMAL LOW (ref 3.87–5.11)
RDW: 15.2 % (ref 11.5–15.5)
RDW: 14.8 % (ref 11.5–15.5)
WBC: 10.4 10*3/uL (ref 4.0–10.5)
WBC: 9 10*3/uL (ref 4.0–10.5)
nRBC: 0 % (ref 0.0–0.2)
nRBC: 0 % (ref 0.0–0.2)

## 2022-01-19 LAB — GLUCOSE, CAPILLARY
Glucose-Capillary: 104 mg/dL — ABNORMAL HIGH (ref 70–99)
Glucose-Capillary: 109 mg/dL — ABNORMAL HIGH (ref 70–99)
Glucose-Capillary: 111 mg/dL — ABNORMAL HIGH (ref 70–99)
Glucose-Capillary: 112 mg/dL — ABNORMAL HIGH (ref 70–99)
Glucose-Capillary: 113 mg/dL — ABNORMAL HIGH (ref 70–99)
Glucose-Capillary: 120 mg/dL — ABNORMAL HIGH (ref 70–99)
Glucose-Capillary: 120 mg/dL — ABNORMAL HIGH (ref 70–99)
Glucose-Capillary: 125 mg/dL — ABNORMAL HIGH (ref 70–99)
Glucose-Capillary: 126 mg/dL — ABNORMAL HIGH (ref 70–99)
Glucose-Capillary: 133 mg/dL — ABNORMAL HIGH (ref 70–99)
Glucose-Capillary: 96 mg/dL (ref 70–99)

## 2022-01-19 LAB — PREPARE CRYOPRECIPITATE
Unit division: 0
Unit division: 0

## 2022-01-19 LAB — BPAM CRYOPRECIPITATE
Blood Product Expiration Date: 202302242000
Blood Product Expiration Date: 202302242000
ISSUE DATE / TIME: 202302241447
ISSUE DATE / TIME: 202302241447
Unit Type and Rh: 6200
Unit Type and Rh: 6200

## 2022-01-19 LAB — BASIC METABOLIC PANEL
Anion gap: 6 (ref 5–15)
Anion gap: 7 (ref 5–15)
BUN: 8 mg/dL (ref 8–23)
BUN: 6 mg/dL — ABNORMAL LOW (ref 8–23)
CO2: 24 mmol/L (ref 22–32)
CO2: 23 mmol/L (ref 22–32)
Calcium: 7.7 mg/dL — ABNORMAL LOW (ref 8.9–10.3)
Calcium: 8.2 mg/dL — ABNORMAL LOW (ref 8.9–10.3)
Chloride: 104 mmol/L (ref 98–111)
Chloride: 99 mmol/L (ref 98–111)
Creatinine, Ser: 0.73 mg/dL (ref 0.44–1.00)
Creatinine, Ser: 0.72 mg/dL (ref 0.44–1.00)
GFR, Estimated: >60 mL/min (ref >60)
GFR, Estimated: >60 mL/min (ref >60)
Glucose, Bld: 124 mg/dL — ABNORMAL HIGH (ref 70–99)
Glucose, Bld: 120 mg/dL — ABNORMAL HIGH (ref 70–99)
Potassium: 4.5 mmol/L (ref 3.5–5.1)
Potassium: 4.3 mmol/L (ref 3.5–5.1)
Sodium: 134 mmol/L — ABNORMAL LOW (ref 135–145)
Sodium: 129 mmol/L — ABNORMAL LOW (ref 135–145)

## 2022-01-19 LAB — MAGNESIUM
Magnesium: 2.3 mg/dL (ref 1.7–2.4)
Magnesium: 1.8 mg/dL (ref 1.7–2.4)

## 2022-01-19 MED ORDER — SODIUM CHLORIDE 0.9 % IV SOLN
6.2500 mg | Freq: Four times a day (QID) | INTRAVENOUS | Status: DC | PRN
  Administered 2022-01-19 (×2): 6.25 mg via INTRAVENOUS
  Filled 2022-01-19 (×2): qty 0.25

## 2022-01-19 MED ORDER — TEMAZEPAM 15 MG PO CAPS
15.0000 mg | ORAL_CAPSULE | Freq: Every evening | ORAL | Status: DC | PRN
  Administered 2022-01-19 – 2022-01-21 (×3): 15 mg via ORAL
  Filled 2022-01-19 (×2): qty 1
  Filled 2022-01-19: qty 2

## 2022-01-19 MED ORDER — SCOPOLAMINE 1 MG/3DAYS TD PT72
1.0000 | MEDICATED_PATCH | TRANSDERMAL | Status: DC
  Administered 2022-01-19: 1.5 mg via TRANSDERMAL
  Filled 2022-01-19: qty 1

## 2022-01-19 MED ORDER — FUROSEMIDE 20 MG PO TABS
20.0000 mg | ORAL_TABLET | Freq: Every day | ORAL | Status: DC
  Administered 2022-01-19: 20 mg via ORAL
  Filled 2022-01-19: qty 1

## 2022-01-19 MED ORDER — INSULIN ASPART 100 UNIT/ML IJ SOLN
0.0000 [IU] | INTRAMUSCULAR | Status: DC
  Administered 2022-01-19: 1 [IU] via SUBCUTANEOUS

## 2022-01-19 MED ORDER — CYCLOBENZAPRINE HCL 10 MG PO TABS
5.0000 mg | ORAL_TABLET | Freq: Three times a day (TID) | ORAL | Status: DC | PRN
Start: 2022-01-19 — End: 2022-01-22
  Administered 2022-01-19 – 2022-01-20 (×5): 5 mg via ORAL
  Filled 2022-01-19 (×7): qty 1

## 2022-01-19 NOTE — TOC Progression Note (Signed)
Transition of Care [facility]) - Progression Note    Patient Details  Name: KASHIRA BEHUNIN MRN: [id number] Date of Birth: 1956-01-05  Transition of Care [facility]) CM/SW Contact  Zenon Mayo, RN Phone Number: 01/19/2022, 2:12 PM  Clinical Narrative:     Transition of Care [facility]) Screening Note   Patient Details  Name: CORTANA VANDERFORD Date of Birth: Dec 19, 1955   Transition of Care [facility]) CM/SW Contact:    Zenon Mayo, RN Phone Number: 01/19/2022, 2:13 PM    Transition of Care Department [facility]) has reviewed patient and no TOC needs have been identified at this time. We will continue to monitor patient advancement through interdisciplinary progression rounds. If new patient transition needs arise, please place a TOC consult.          Expected Discharge Plan and Services                                                 Social Determinants of Health (SDOH) Interventions    Readmission Risk Interventions No flowsheet data found.

## 2022-01-19 NOTE — Progress Notes (Signed)
CT surgery PM rounds  Patient had good day but persistent nausea despite Zofran and Phenergan We will place scopolamine patch. P.m. labs reviewed and are satisfactory Blood pressure 116/67, pulse 80, temperature 100 F (37.8 C), resp. rate 11, height 5' (1.524 m), weight 49.9 kg, last menstrual period 11/25/2001, SpO2 95 %.

## 2022-01-19 NOTE — Progress Notes (Signed)
1 Day Post-Op Procedure(s) (LRB): AORTIC VALVE REPLACEMENT (AVR) USING INSPIRIS VALVE SIZE 19MM (N/A) TRANSESOPHAGEAL ECHOCARDIOGRAM (TEE) (N/A) Subjective: Awake, c/o nausea, being tense all over  Objective: Vital signs in last 24 hours: Temp:  [95.7 F (35.4 C)-99 F (37.2 C)] 98.8 F (37.1 C) (02/25 0809) Pulse Rate:  [80] 80 (02/25 0800) Cardiac Rhythm: Atrial paced (02/25 0800) Resp:  [9-21] 10 (02/25 0809) BP: (92-131)/(45-81) 123/61 (02/25 0800) SpO2:  [99 %-100 %] 100 % (02/25 0809) Arterial Line BP: (74-166)/(39-79) 166/53 (02/25 0809) FiO2 (%):  [40 %-50 %] 40 % (02/24 2000)  Hemodynamic parameters for last 24 hours: PAP: (9-31)/(3-21) 20/9 CVP:  [10 mmHg] 10 mmHg PCWP:  [9 mmHg] 9 mmHg CO:  [2.1 L/min-4.3 L/min] 4.3 L/min CI:  [1.4 L/min/m2-3.3 L/min/m2] 3 L/min/m2  Intake/Output from previous day: 02/24 0701 - 02/25 0700 In: 5794.3 [P.O.:360; I.V.:2531.1; Blood:587; IV Piggyback:2316.3] Out: 2393 [Urine:1652; Chest Tube:741] Intake/Output this shift: Total I/O In: -  Out: 670 [Urine:600; Chest Tube:70]       Exam    General- alert and comfortable    Neck- no JVD, no cervical adenopathy palpable, no carotid bruit   Lungs- clear without rales, wheezes   Cor- regular rate and rhythm, no murmur , gallop   Abdomen- soft, non-tender   Extremities - warm, non-tender, minimal edema   Neuro- oriented, appropriate, no focal weakness   Lab Results: Recent Labs    01/18/22 1820 01/18/22 2025 01/18/22 2138 01/19/22 0231  WBC 9.5  --   --  10.4  HGB 9.5*   < > 8.5* 9.7*  HCT 27.2*   < > 25.0* 28.8*  PLT 93*  --   --  115*   < > = values in this interval not displayed.   BMET:  Recent Labs    01/18/22 1820 01/18/22 2025 01/18/22 2138 01/19/22 0231  NA 134*   < > 136 134*  K 5.2*   < > 4.6 4.5  CL 105  --   --  104  CO2 23  --   --  24  GLUCOSE 108*  --   --  124*  BUN 9  --   --  8  CREATININE 0.76  --   --  0.73  CALCIUM 7.1*  --   --  7.7*    < > = values in this interval not displayed.    PT/INR:  Recent Labs    01/18/22 1234  LABPROT 18.6*  INR 1.6*   ABG    Component Value Date/Time   PHART 7.292 (L) 01/18/2022 2138   HCO3 24.4 01/18/2022 2138   TCO2 26 01/18/2022 2138   ACIDBASEDEF 2.0 01/18/2022 2138   O2SAT 99 01/18/2022 2138   CBG (last 3)  Recent Labs    01/19/22 0417 01/19/22 0610 01/19/22 0747  GLUCAP 104* 112* 96    Assessment/Plan: S/P Procedure(s) (LRB): AORTIC VALVE REPLACEMENT (AVR) USING INSPIRIS VALVE SIZE 19MM (N/A) TRANSESOPHAGEAL ECHOCARDIOGRAM (TEE) (N/A) DC lines, treat nausea, progression   LOS: 1 day    Jessica Ramsey 01/19/2022

## 2022-01-20 ENCOUNTER — Inpatient Hospital Stay (HOSPITAL_COMMUNITY): Payer: PPO

## 2022-01-20 LAB — CBC
HCT: 26.4 % — ABNORMAL LOW (ref 36.0–46.0)
Hemoglobin: 8.8 g/dL — ABNORMAL LOW (ref 12.0–15.0)
MCH: 31.8 pg (ref 26.0–34.0)
MCHC: 33.3 g/dL (ref 30.0–36.0)
MCV: 95.3 fL (ref 80.0–100.0)
Platelets: 104 10*3/uL — ABNORMAL LOW (ref 150–400)
RBC: 2.77 MIL/uL — ABNORMAL LOW (ref 3.87–5.11)
RDW: 14.6 % (ref 11.5–15.5)
WBC: 11.3 10*3/uL — ABNORMAL HIGH (ref 4.0–10.5)
nRBC: 0 % (ref 0.0–0.2)

## 2022-01-20 LAB — BASIC METABOLIC PANEL
Anion gap: 7 (ref 5–15)
BUN: 7 mg/dL — ABNORMAL LOW (ref 8–23)
CO2: 24 mmol/L (ref 22–32)
Calcium: 8.4 mg/dL — ABNORMAL LOW (ref 8.9–10.3)
Chloride: 101 mmol/L (ref 98–111)
Creatinine, Ser: 0.83 mg/dL (ref 0.44–1.00)
GFR, Estimated: >60 mL/min (ref >60)
Glucose, Bld: 93 mg/dL (ref 70–99)
Potassium: 4.2 mmol/L (ref 3.5–5.1)
Sodium: 132 mmol/L — ABNORMAL LOW (ref 135–145)

## 2022-01-20 LAB — GLUCOSE, CAPILLARY
Glucose-Capillary: 86 mg/dL (ref 70–99)
Glucose-Capillary: 132 mg/dL — ABNORMAL HIGH (ref 70–99)
Glucose-Capillary: 92 mg/dL (ref 70–99)

## 2022-01-20 MED ORDER — FUROSEMIDE 40 MG PO TABS
40.0000 mg | ORAL_TABLET | Freq: Every day | ORAL | Status: DC
  Administered 2022-01-20 – 2022-01-22 (×3): 40 mg via ORAL
  Filled 2022-01-20 (×3): qty 1

## 2022-01-20 NOTE — Progress Notes (Signed)
2 Days Post-Op Procedure(s) (LRB): AORTIC VALVE REPLACEMENT (AVR) USING INSPIRIS VALVE SIZE 19MM (N/A) TRANSESOPHAGEAL ECHOCARDIOGRAM (TEE) (N/A) Subjective: Nausea improved and slept better Chest tube drainage scant Walked in hall Ready for 4E  Objective: Vital signs in last 24 hours: Temp:  [97.9 F (36.6 C)-100 F (37.8 C)] 98.7 F (37.1 C) (02/26 0728) Pulse Rate:  [68-80] 68 (02/26 0400) Cardiac Rhythm: Normal sinus rhythm (02/26 0400) Resp:  [9-18] 11 (02/26 0600) BP: (107-140)/(57-90) 137/68 (02/26 0600) SpO2:  [90 %-100 %] 98 % (02/26 0600) Weight:  [54.4 kg] 54.4 kg (02/26 0500)  Hemodynamic parameters for last 24 hours:  stable  Intake/Output from previous day: 02/25 0701 - 02/26 0700 In: 997.7 [P.O.:500; I.V.:257.3; IV Piggyback:240.4] Out: 2060 [Urine:1690; Chest Tube:370] Intake/Output this shift: No intake/output data recorded.       Exam    General- alert and comfortable    Neck- no JVD, no cervical adenopathy palpable, no carotid bruit   Lungs- clear without rales, wheezes. No air leak from drains   Cor- regular rate and rhythm, no murmur , gallop   Abdomen- soft, non-tender   Extremities - warm, non-tender, minimal edema   Neuro- oriented, appropriate, no focal weakness   Lab Results: Recent Labs    01/19/22 1616 01/20/22 0442  WBC 9.0 11.3*  HGB 8.9* 8.8*  HCT 25.8* 26.4*  PLT PLATELET CLUMPS NOTED ON SMEAR, UNABLE TO ESTIMATE 104*   BMET:  Recent Labs    01/19/22 1616 01/20/22 0442  NA 129* 132*  K 4.3 4.2  CL 99 101  CO2 23 24  GLUCOSE 120* 93  BUN 6* 7*  CREATININE 0.72 0.83  CALCIUM 8.2* 8.4*    PT/INR:  Recent Labs    01/18/22 1234  LABPROT 18.6*  INR 1.6*   ABG    Component Value Date/Time   PHART 7.292 (L) 01/18/2022 2138   HCO3 24.4 01/18/2022 2138   TCO2 26 01/18/2022 2138   ACIDBASEDEF 2.0 01/18/2022 2138   O2SAT 99 01/18/2022 2138   CBG (last 3)  Recent Labs    01/19/22 2329 01/20/22 0337  01/20/22 0723  GLUCAP 133* 86 132*    Assessment/Plan: S/P Procedure(s) (LRB): AORTIC VALVE REPLACEMENT (AVR) USING INSPIRIS VALVE SIZE 19MM (N/A) TRANSESOPHAGEAL ECHOCARDIOGRAM (TEE) (N/A) Mobilize Diuresis d/c tubes/lines Plan for transfer to step-down: see transfer orders   LOS: 2 days    Dahlia Byes 01/20/2022

## 2022-01-20 NOTE — Plan of Care (Signed)
°  Problem: Education: Goal: Will demonstrate proper wound care and an understanding of methods to prevent future damage Outcome: Progressing Goal: Knowledge of disease or condition will improve Outcome: Progressing Goal: Knowledge of the prescribed therapeutic regimen will improve Outcome: Progressing Goal: Individualized Educational Video(s) Outcome: Progressing   Problem: Activity: Goal: Risk for activity intolerance will decrease Outcome: Progressing   Problem: Cardiac: Goal: Will achieve and/or maintain hemodynamic stability Outcome: Progressing   Problem: Clinical Measurements: Goal: Postoperative complications will be avoided or minimized Outcome: Progressing
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
## 2022-01-21 ENCOUNTER — Inpatient Hospital Stay (HOSPITAL_COMMUNITY): Payer: PPO

## 2022-01-21 ENCOUNTER — Encounter (HOSPITAL_COMMUNITY): Payer: Self-pay | Admitting: Surgery

## 2022-01-21 ENCOUNTER — Other Ambulatory Visit: Payer: Self-pay | Admitting: Cardiology

## 2022-01-21 DIAGNOSIS — I350 Nonrheumatic aortic (valve) stenosis: Secondary | ICD-10-CM

## 2022-01-21 LAB — CBC
HCT: 25.4 % — ABNORMAL LOW (ref 36.0–46.0)
Hemoglobin: 8.6 g/dL — ABNORMAL LOW (ref 12.0–15.0)
MCH: 31.5 pg (ref 26.0–34.0)
MCHC: 33.9 g/dL (ref 30.0–36.0)
MCV: 93 fL (ref 80.0–100.0)
Platelets: 100 10*3/uL — ABNORMAL LOW (ref 150–400)
RBC: 2.73 MIL/uL — ABNORMAL LOW (ref 3.87–5.11)
RDW: 14 % (ref 11.5–15.5)
WBC: 10.5 10*3/uL (ref 4.0–10.5)
nRBC: 0 % (ref 0.0–0.2)

## 2022-01-21 LAB — BASIC METABOLIC PANEL WITH GFR
Anion gap: 8 (ref 5–15)
BUN: 9 mg/dL (ref 8–23)
CO2: 27 mmol/L (ref 22–32)
Calcium: 8.5 mg/dL — ABNORMAL LOW (ref 8.9–10.3)
Chloride: 98 mmol/L (ref 98–111)
Creatinine, Ser: 0.89 mg/dL (ref 0.44–1.00)
GFR, Estimated: >60 mL/min (ref >60)
Glucose, Bld: 115 mg/dL — ABNORMAL HIGH (ref 70–99)
Potassium: 3.7 mmol/L (ref 3.5–5.1)
Sodium: 133 mmol/L — ABNORMAL LOW (ref 135–145)

## 2022-01-21 LAB — SURGICAL PATHOLOGY

## 2022-01-21 MED ORDER — FE FUMARATE-B12-VIT C-FA-IFC PO CAPS
1.0000 | ORAL_CAPSULE | Freq: Two times a day (BID) | ORAL | Status: DC
  Administered 2022-01-21 – 2022-01-22 (×3): 1 via ORAL
  Filled 2022-01-21 (×3): qty 1

## 2022-01-21 MED ORDER — LISINOPRIL 10 MG PO TABS
10.0000 mg | ORAL_TABLET | Freq: Every day | ORAL | Status: DC
  Administered 2022-01-21 – 2022-01-22 (×2): 10 mg via ORAL
  Filled 2022-01-21 (×2): qty 1

## 2022-01-21 MED ORDER — MONTELUKAST SODIUM 10 MG PO TABS
10.0000 mg | ORAL_TABLET | Freq: Every day | ORAL | Status: DC
Start: 2022-01-21 — End: 2022-01-22
  Administered 2022-01-21: 10 mg via ORAL
  Filled 2022-01-21: qty 1

## 2022-01-21 MED ORDER — POTASSIUM CHLORIDE CRYS ER 20 MEQ PO TBCR
20.0000 meq | EXTENDED_RELEASE_TABLET | Freq: Two times a day (BID) | ORAL | Status: DC
  Administered 2022-01-22: 20 meq via ORAL
  Filled 2022-01-21: qty 1

## 2022-01-21 MED ORDER — POTASSIUM CHLORIDE CRYS ER 20 MEQ PO TBCR
30.0000 meq | EXTENDED_RELEASE_TABLET | Freq: Once | ORAL | Status: AC
  Administered 2022-01-21: 30 meq via ORAL
  Filled 2022-01-21: qty 1

## 2022-01-21 MED ORDER — METOPROLOL TARTRATE 12.5 MG HALF TABLET
12.5000 mg | ORAL_TABLET | Freq: Two times a day (BID) | ORAL | Status: DC
  Administered 2022-01-21 – 2022-01-22 (×3): 12.5 mg via ORAL
  Filled 2022-01-21 (×3): qty 1

## 2022-01-21 MED ORDER — FLUTICASONE PROPIONATE 50 MCG/ACT NA SUSP
2.0000 | Freq: Every day | NASAL | Status: DC
  Administered 2022-01-21 – 2022-01-22 (×2): 2 via NASAL
  Filled 2022-01-21: qty 16

## 2022-01-21 MED ORDER — LORATADINE 10 MG PO TABS
10.0000 mg | ORAL_TABLET | Freq: Every day | ORAL | Status: DC
  Administered 2022-01-21 – 2022-01-22 (×2): 10 mg via ORAL
  Filled 2022-01-21 (×2): qty 1

## 2022-01-21 MED FILL — Thrombin (Recombinant) For Soln 20000 Unit: CUTANEOUS | Qty: 1 | Status: AC

## 2022-01-21 NOTE — Plan of Care (Signed)
°  Problem: Activity: Goal: Risk for activity intolerance will decrease Outcome: Progressing   Problem: Cardiac: Goal: Will achieve and/or maintain hemodynamic stability Outcome: Progressing   Problem: Clinical Measurements: Goal: Postoperative complications will be avoided or minimized Outcome: Progressing   Problem: Skin Integrity: Goal: Wound healing without signs and symptoms of infection Outcome: Progressing

## 2022-01-21 NOTE — Progress Notes (Signed)
Mobility Specialist Progress Note   01/21/22 1546  Mobility  Activity Ambulated independently in hallway  Level of Assistance Modified independent, requires aide device or extra time  Assistive Device Front wheel walker  Distance Ambulated (ft) 470 ft  Activity Response Tolerated well  $Mobility charge 1 Mobility   Received pt in bed having no complaints and agreeable to mobility. Asymptomatic throughout ambulation, returned back to bed w/ call bell in reach, sternal precautions revisited and all needs met.  Holland Falling Mobility Specialist Phone Number [phone]

## 2022-01-21 NOTE — Addendum Note (Signed)
Addendum  created 01/21/22 0751 by Josephine Igo, CRNA   Order list changed, Pharmacy for encounter modified

## 2022-01-21 NOTE — Progress Notes (Addendum)
[address]       [place] [address]             [phone]      3 Days Post-Op Procedure(s) (LRB): AORTIC VALVE REPLACEMENT (AVR) USING INSPIRIS VALVE SIZE 19MM (N/A) TRANSESOPHAGEAL ECHOCARDIOGRAM (TEE) (N/A) Subjective: Doing well, some sinus congestion which is a chronic issue Didn't sleep well No BM but nausea has been an issue, better now  Objective: Vital signs in last 24 hours: Temp:  [97.6 F (36.4 C)-98.7 F (37.1 C)] 98.2 F (36.8 C) (02/27 0716) Pulse Rate:  [45-84] 74 (02/27 0716) Cardiac Rhythm: Normal sinus rhythm (02/26 1900) Resp:  [9-20] 16 (02/27 0716) BP: (124-160)/(59-76) 160/63 (02/27 0716) SpO2:  [92 %-99 %] 96 % (02/27 0716) Weight:  [53.8 kg] 53.8 kg (02/27 0500)  Hemodynamic parameters for last 24 hours:    Intake/Output from previous day: 02/26 0701 - 02/27 0700 In: 120 [P.O.:120] Out: 325 [Urine:300; Chest Tube:25] Intake/Output this shift: No intake/output data recorded.  General appearance: alert, cooperative, and no distress Heart: regular rate and rhythm and no murmur Lungs: clear to auscultation bilaterally Abdomen: benign Extremities: trace edema Wound: incis healing well  Lab Results: Recent Labs    01/20/22 0442 01/21/22 0215  WBC 11.3* 10.5  HGB 8.8* 8.6*  HCT 26.4* 25.4*  PLT 104* 100*   BMET:  Recent Labs    01/20/22 0442 01/21/22 0215  NA 132* 133*  K 4.2 3.7  CL 101 98  CO2 24 27  GLUCOSE 93 115*  BUN 7* 9  CREATININE 0.83 0.89  CALCIUM 8.4* 8.5*    PT/INR:  Recent Labs    01/18/22 1234  LABPROT 18.6*  INR 1.6*   ABG    Component Value Date/Time   PHART 7.292 (L) 01/18/2022 2138   HCO3 24.4 01/18/2022 2138   TCO2 26 01/18/2022 2138   ACIDBASEDEF 2.0 01/18/2022 2138   O2SAT 99 01/18/2022 2138   CBG (last 3)  Recent Labs    01/20/22 0337 01/20/22 0723 01/20/22 1116  GLUCAP 86 132* 92    Meds Scheduled Meds:  acetaminophen  1,000 mg Oral Q6H   Or    acetaminophen (TYLENOL) oral liquid 160 mg/5 mL  1,000 mg Per Tube Q6H   aspirin EC  325 mg Oral Daily   Or   aspirin  324 mg Per Tube Daily   bisacodyl  10 mg Oral Daily   Or   bisacodyl  10 mg Rectal Daily   chlorhexidine gluconate (MEDLINE KIT)  15 mL Mouth Rinse BID   Chlorhexidine Gluconate Cloth  6 each Topical Daily   cycloSPORINE  1 drop Both Eyes BID   docusate sodium  200 mg Oral Daily   DULoxetine  60 mg Oral Daily   furosemide  40 mg Oral Daily   metoprolol tartrate  12.5 mg Oral BID   Or   metoprolol tartrate  12.5 mg Per Tube BID   nicotine  21 mg Transdermal Q0600   pantoprazole  40 mg Oral Daily   potassium chloride  30 mEq Oral Once   scopolamine  1 patch Transdermal Q72H   sodium chloride flush  10-40 mL Intracatheter Q12H   sodium chloride flush  3 mL Intravenous Q12H   Continuous Infusions:  sodium chloride Stopped (01/18/22 1312)   sodium chloride 250 mL (01/19/22 0749)   sodium chloride 10 mL/hr at 01/18/22 1220   promethazine (PHENERGAN) injection (IM or IVPB) Stopped (01/19/22  1705)   PRN Meds:.sodium chloride, cyclobenzaprine, metoprolol tartrate, ondansetron (ZOFRAN) IV, oxyCODONE, promethazine (PHENERGAN) injection (IM or IVPB), sodium chloride flush, sodium chloride flush, temazepam, traMADol  Xrays DG Chest Port 1 View  Result Date: 01/20/2022 CLINICAL DATA:  Status post aortic valve replacement. Follow-up exam. EXAM: PORTABLE CHEST 1 VIEW COMPARISON:  01/19/2022 and older studies. FINDINGS: Since the previous day's study, the Swan-Ganz catheter has been removed. The introducer sheath remains in place. Mediastinal and chest tubes are stable. Cardiac silhouette normal in size and configuration. No mediastinal widening. Mild, left greater than right, lung base opacities are unchanged consistent with atelectasis. Remainder of the lungs is clear. No pneumothorax. IMPRESSION: 1. No acute findings or evidence of an operative complication. 2. Persistent, left  greater than right, lung base opacities consistent with atelectasis. Suspect a small left pleural effusion. 3. Remaining support apparatus is stable and well positioned. Electronically Signed   By: Lajean Manes M.D.   On: 01/20/2022 08:24    Assessment/Plan: S/P Procedure(s) (LRB): AORTIC VALVE REPLACEMENT (AVR) USING INSPIRIS VALVE SIZE 19MM (N/A) TRANSESOPHAGEAL ECHOCARDIOGRAM (TEE) (N/A) POD#3  1 afeb, VSS S BP 120's-160, sinus rhythm- will resume lisinopril 2 sats good on RA 3 voiding, weight approx 3.5 kg >preop, normal renal function, minor hyponatremia- improved 4 expected ABLA fairly stable- will start trinsicon, H/H 8.6/25.4 5 resume chronic sleeping meds and allergy meds 6 routine pulm hygiene and rehab 7 poss home 1-2 days    LOS: 3 days    John Giovanni PA-C Pager [phone] 01/21/2022    Chart reviewed, patient examined, agree with above. She feels well today and wants to go home tomorrow if no changes. I think that is fine as long as rhythm is stable.

## 2022-01-21 NOTE — Progress Notes (Signed)
CARDIAC REHAB PHASE I   PRE:  Rate/Rhythm: 87 SR  BP:  Sitting: 160/70      SaO2: 96 RA  MODE:  Ambulation: 400 ft   POST:  Rate/Rhythm: 108 ST  BP:  Sitting: 173/71    SaO2: 100 RA   Pt agreeable to ambulation. Pt ambulated 430ft in hallway independently with front wheel walker. Pt states some SOB, sats stable on RA. Pt denies CP or dizziness throughout. Pt assisted to BR than returned to recliner. Encouraged continued ambulation and IS use. Pt denies DME needs. Will continue to follow.  [phone] Rufina Falco, RN BSN 01/21/2022 9:57 AM

## 2022-01-22 LAB — BPAM RBC
Blood Product Expiration Date: 202303042359
Blood Product Expiration Date: 202303042359
Blood Product Expiration Date: 202303042359
Blood Product Expiration Date: 202303082359
Blood Product Expiration Date: 202303082359
Blood Product Expiration Date: 202303082359
Blood Product Expiration Date: 202303082359
Blood Product Expiration Date: 202303082359
ISSUE DATE / TIME: 202302240958
ISSUE DATE / TIME: 202302240958
ISSUE DATE / TIME: 202302241641
ISSUE DATE / TIME: 202302250126
Unit Type and Rh: 6200
Unit Type and Rh: 6200
Unit Type and Rh: 6200
Unit Type and Rh: 6200
Unit Type and Rh: 6200
Unit Type and Rh: 6200
Unit Type and Rh: 6200
Unit Type and Rh: 6200

## 2022-01-22 LAB — TYPE AND SCREEN
ABO/RH(D): A POS
Antibody Screen: NEGATIVE
Unit division: 0
Unit division: 0
Unit division: 0
Unit division: 0
Unit division: 0
Unit division: 0
Unit division: 0
Unit division: 0

## 2022-01-22 MED ORDER — ACYCLOVIR 400 MG PO TABS: 400.0000 mg | ORAL_TABLET | Freq: Two times a day (BID) | ORAL | Status: DC | PRN

## 2022-01-22 MED ORDER — METOPROLOL TARTRATE 25 MG PO TABS: 12.5000 mg | ORAL_TABLET | Freq: Two times a day (BID) | ORAL | 1 refills | Status: DC

## 2022-01-22 MED ORDER — LISINOPRIL 10 MG PO TABS: 10.0000 mg | ORAL_TABLET | Freq: Every day | ORAL | Status: DC

## 2022-01-22 MED ORDER — NICOTINE 21 MG/24HR TD PT24: 21.0000 mg | MEDICATED_PATCH | Freq: Every day | TRANSDERMAL | 0 refills | Status: DC

## 2022-01-22 MED ORDER — ASPIRIN 325 MG PO TBEC: 325.0000 mg | DELAYED_RELEASE_TABLET | Freq: Every day | ORAL | Status: DC

## 2022-01-22 MED ORDER — HYDROCODONE-ACETAMINOPHEN 5-325 MG PO TABS: 0.5000 | ORAL_TABLET | Freq: Four times a day (QID) | ORAL | 0 refills | Status: AC | PRN

## 2022-01-22 NOTE — TOC Transition Note (Addendum)
Transition of Care (TOC) - CM/SW Discharge Note Marvetta Gibbons RN, BSN Transitions of Care Unit 4E- RN Case Manager See Treatment Team for direct phone #    Patient Details  Name: Jessica Ramsey MRN: [id number] Date of Birth: September 12, 1956  Transition of Care [facility]) CM/SW Contact:  Dawayne Patricia, RN Phone Number: 01/22/2022, 10:26 AM   Clinical Narrative:    Pt stable for transition home today s/p AVR. Transition of Care Department [facility]) has reviewed patient and no TOC needs have been identified at this time.  CM was notified by [facility] that pt was referred to them pre-op with [facility] protocol to follow post discharge. They have already spoken with patient and will contact post discharge to schedule visits.    Final next level of care: [place] Barriers to Discharge: No Barriers Identified   Patient Goals and CMS Choice     Choice offered to / list presented to : NA  Discharge Placement               Home w/ [facility]        Discharge Plan and Services                DME Arranged: N/A DME Agency: NA       HH Arranged: NA HH Agency: NA        Social Determinants of Health (SDOH) Interventions     Readmission Risk Interventions Readmission Risk Prevention Plan 01/22/2022  Post Dischage Appt Complete  Medication Screening Complete  Transportation Screening Complete  Some recent data might be hidden

## 2022-01-22 NOTE — Progress Notes (Signed)
Explained discharge summary to patient. Discussed follow-up appointments and next scheduled medication administration. Removed her IV's and educated her about post surgical wound care. All belongings are in patient possession. Notified Central telemetry monitoring that the patient was discharging home. Removed the monitor. Transported downstairs for discharge and assisted into the vehicle.

## 2022-01-22 NOTE — Discharge Summary (Signed)
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                                             +----------+--------+--------+--------+--------+------------------+  ECA        50                                                      +----------+--------+--------+--------+--------+------------------+ +----------+--------+-------+----------------+------------+             PSV cm/s EDV cms Describe         Arm Pressure  +----------+--------+-------+----------------+------------+  Subclavian 102              Multiphasic, WNL               +----------+--------+-------+----------------+------------+ +---------+--------+--+--------+--+---------+  Vertebral PSV cm/s 38 EDV cm/s 10 Antegrade  +---------+--------+--+--------+--+---------+ Left Carotid Findings: +----------+--------+--------+--------+------------------+--------+  PSV cm/s EDV cm/s Stenosis Describe           Comments  +----------+--------+--------+--------+------------------+--------+  CCA Prox   76       12                                             +----------+--------+--------+--------+------------------+--------+  CCA Distal 47       9                                              +----------+--------+--------+--------+------------------+--------+  ICA Prox   54       15       1-39%    focal and  calcific           +----------+--------+--------+--------+------------------+--------+  ICA Distal 65       19                                             +----------+--------+--------+--------+------------------+--------+  ECA        77                                                      +----------+--------+--------+--------+------------------+--------+ +----------+--------+--------+----------------+------------+  Subclavian PSV cm/s EDV cm/s Describe         Arm Pressure  +----------+--------+--------+----------------+------------+             106               Multiphasic, WNL               +----------+--------+--------+----------------+------------+ +---------+--------+--+--------+-+---------+  Vertebral PSV cm/s 33 EDV cm/s 5 Antegrade  +---------+--------+--+--------+-+---------+  ABI Findings: +--------+------------------+-----+---------+--------+  Right    Rt Pressure (mmHg) Index Waveform  Comment   +--------+------------------+-----+---------+--------+  Brachial                          triphasic           +--------+------------------+-----+---------+--------+ +--------+------------------+-----+---------+-------+  Left     Lt Pressure (mmHg) Index Waveform  Comment  +--------+------------------+-----+---------+-------+  Brachial                          triphasic          +--------+------------------+-----+---------+-------+  Right Doppler Findings: +--------+--------+-----+---------+--------+  Site     Pressure Index Doppler   Comments  +--------+--------+-----+---------+--------+  Brachial                triphasic           +--------+--------+-----+---------+--------+  Radial                  triphasic           +--------+--------+-----+---------+--------+  Ulnar                   triphasic           +--------+--------+-----+---------+--------+  Left Doppler Findings: +--------+--------+-----+---------+--------+  Site  Pressure Index Doppler   Comments  +--------+--------+-----+---------+--------+   Brachial                triphasic           +--------+--------+-----+---------+--------+  Radial                  triphasic           +--------+--------+-----+---------+--------+  Ulnar                   triphasic           +--------+--------+-----+---------+--------+  Summary: Right Carotid: The extracranial vessels were near-normal with only minimal wall                thickening or plaque. Left Carotid: Velocities in the left ICA are consistent with a 1-39% stenosis. Vertebrals:  Bilateral vertebral arteries demonstrate antegrade flow. Subclavians: Normal flow hemodynamics were seen in bilateral subclavian              arteries.  Electronically signed by Harold Barban MD on 01/16/2022 at 10:31:06 PM.    Final    CT ANGIO ABDOMEN PELVIS  W &/OR WO CONTRAST  Result Date: 01/03/2022 CLINICAL DATA:  Preop evaluation for TAVR EXAM: CT ANGIOGRAPHY CHEST, ABDOMEN AND PELVIS TECHNIQUE: Non-contrast CT of the chest was initially obtained. Multidetector CT imaging through the chest, abdomen and pelvis was performed using the standard protocol during bolus administration of intravenous contrast. Multiplanar reconstructed images and MIPs were obtained and reviewed to evaluate the vascular anatomy. RADIATION DOSE REDUCTION: This exam was performed according to the departmental dose-optimization program which includes automated exposure control, adjustment of the mA and/or kV according to patient size and/or use of iterative reconstruction technique. CONTRAST:  100mL OMNIPAQUE IOHEXOL 350 MG/ML SOLN COMPARISON:  CT neck angiogram dated August 04, 2020 FINDINGS: CTA CHEST FINDINGS Cardiovascular: Normal heart size. No significant pericardial effusion/thickening. Aortic valve thickening and calcifications. No coronary artery calcifications. Mild atherosclerotic disease of the thoracic aorta. Great vessels are normal in course and caliber. No central pulmonary emboli. Mediastinum/Nodes: No discrete thyroid nodules.  Unremarkable esophagus. Mildly enlarged right hilar lymph node measuring 1.0 cm in short axis on image 54 series 4, likely reactive. No enlarged mediastinal or axillary lymph nodes. Lungs/Pleura: No pneumothorax. No pleural effusion. Mild centrilobular emphysema. Bibasilar atelectasis. Musculoskeletal: C4-C7 ACDF, when compared with CT neck angio dated August 04, 2020, there is been interval anterior migration of the C7 vertebral body screw. No aggressive appearing focal osseous lesions. CTA ABDOMEN AND PELVIS FINDINGS Hepatobiliary: Normal liver with no liver mass. Cholelithiasis with no gallbladder wall thickening. No biliary ductal dilatation. Pancreas: Normal, with no mass or duct dilation. Spleen: Normal size. No mass. Adrenals/Urinary Tract: Normal adrenals. Kidneys enhance symmetrically with no evidence of hydronephrosis or nephrolithiasis. Normal bladder. Stomach/Bowel: Normal non-distended stomach. Normal caliber small bowel with no small bowel wall thickening. Normal appendix. No large bowel wall thickening or pericolonic fat stranding. Vascular/Lymphatic: Moderate atherosclerotic disease of the abdominal aorta consisting of calcified and noncalcified plaque. Major aortic branch vessels are normal in caliber with no significant stenosis. No pathologically enlarged lymph nodes in the abdomen or pelvis. Reproductive: Uterus is present.  No adnexal masses. Other: No pneumoperitoneum, ascites or focal fluid collection. Musculoskeletal: No aggressive appearing focal osseous lesions. Old left pubic ramus fracture. Prior screw fixation of the left pelvis. VASCULAR MEASUREMENTS PERTINENT TO TAVR: AORTA: Minimal Aortic Diameter -  9.4 mm Severity of Aortic Calcification-moderate RIGHT PELVIS: Right  Common Iliac Artery - Minimal Diameter-5.4 mm Tortuosity-none Calcification-mild Right External Iliac Artery - Minimal Diameter-5.3 mm Tortuosity-none Calcification-none Right Common Femoral Artery - Minimal  Diameter-4.7 mm Tortuosity-none Calcification-mild LEFT PELVIS: Left Common Iliac Artery - Minimal Diameter-5.9 mm Tortuosity-none Calcification-mild Left External Iliac Artery - Minimal Diameter-5.4 mm Tortuosity-mild Calcification-none Left Common Femoral Artery - Minimal Diameter-5.9 mm Tortuosity-none Calcification-mild Review of the MIP images confirms the above findings. IMPRESSION: Vascular: 1. Vascular findings and measurements pertinent to potential TAVR procedure, as detailed above. 2. Severe thickening calcification of the aortic valve, compatible with reported clinical history of severe aortic stenosis. 3. Mild-to-moderate aortoiliac atherosclerosis. Nonvascular: 1. C4-C7 ACDF, when compared with CT neck angio dated August 04, 2020, there is been interval anterior migration of the C7 vertebral body screw. Recommend dedicated cervical spine radiographs and orthopedic/neurologic consultation. 2. Cholelithiasis. Electronically Signed   By: Yetta Glassman M.D.   On: 01/03/2022 13:00     Treatments: surgery Surgeon:  Gaye Pollack, MD   First Assistant: Jadene Pierini,  PA-C:     Preoperative Diagnosis:  Severe aortic stenosis and severe aortic insufficiency     Postoperative Diagnosis:  Same     Procedure:   Median Sternotomy Extracorporeal circulation 3.   Aortic valve replacement using a 19 mm Edwards INSPIRIS RESILIA Pericardial valve.   Anesthesia:  General Endotracheal  Discharge Exam: Blood pressure (!) 159/88, pulse 85, temperature 98.2 F (36.8 C), temperature source Oral, resp. rate 17, height 5' (1.524 m), weight 52.2 kg, last menstrual period 11/25/2001, SpO2 98 %.  General appearance: alert, cooperative, and no distress Heart: regular rate and rhythm and soft systolic murmur Lungs: clear to auscultation bilaterally Abdomen: benign Extremities: no edema Wound: incis healing well  Discharge Medications:  The patient has been discharged on:   1.Beta Blocker:   Yes [ y  ]                              No   [   ]                              If No, reason:  2.Ace Inhibitor/ARB: Yes [  y ]                                     No  [    ]                                     If No, reason:  3.Statin:   Yes [   ]                  No  [ n  ]                  If No, reason:no CAD  4.Shela CommonsVelta Addison  Blue.Reese   ]                  No   [   ]                  If No, reason:  Patient had ACS upon admission:n  Plavix/P2Y12 inhibitor: Yes [   ]  No  [  n ]       Discharge Instructions     Amb Referral to Cardiac Rehabilitation   Complete by: As directed    Diagnosis: Valve Replacement   Valve: Aortic   After initial evaluation and assessments completed: Virtual Based Care may be provided alone or in conjunction with Phase 2 Cardiac Rehab based on patient barriers.: Yes   Discharge patient   Complete by: As directed    Discharge disposition: 01-Home or Self Care   Discharge patient date: 01/22/2022      Current Medication List-Note: Take only these medications. Stop taking all medications not included on this list. If you have any questions regarding medications not on this list, please follow up with your healthcare provider. Bring this list to your next appointment. Current Medication List-Note: Take only these medications. Stop taking all medications not included on this list. If you have any questions regarding medications not on this list, please follow up with your healthcare provider. Bring this list to your next appointment.    Medication Details Next Dose Due Morning Afternoon Evening Bedtime As Needed   acetaminophen 500 MG tablet Commonly known as: TYLENOL Take 1,000 mg by mouth 3 (three) times daily as needed for mild pain or moderate pain.          acyclovir 400 MG tablet Commonly known as: ZOVIRAX Take 1-2 tablets (400-800 mg total) by mouth 2 (two) times daily as needed (outbreaks).         Icon  medications to start taking   aspirin 325 MG EC tablet Take 1 tablet (325 mg total) by mouth daily.          B-12 5000 MCG Caps Take 5,000 mcg by mouth daily.          Biotin 1000 MCG tablet Take 1,000 mcg by mouth daily.          cetirizine 10 MG tablet Commonly known as: ZYRTEC Take 10 mg by mouth daily as needed for allergies.          cycloSPORINE 0.05 % ophthalmic emulsion Commonly known as: RESTASIS Place 1 drop into both eyes 2 (two) times daily.          docusate sodium 100 MG capsule Commonly known as: COLACE Take 100 mg by mouth every other day.          DULoxetine 60 MG capsule Commonly known as: CYMBALTA Take 1 capsule (60 mg total) by mouth daily. Only take 1 pill due to it being 60 mg and not 30 mg.          EPINEPHrine 0.3 mg/0.3 mL Soaj injection Commonly known as: EPI-PEN Inject 0.3 mg into the muscle as needed for anaphylaxis.          Eszopiclone 3 MG Tabs Take 3 mg by mouth at bedtime as needed (sleep). Take immediately before bedtime          FISH OIL PO Take 1,500 mg by mouth 2 (two) times daily.          FLAXSEED OIL PO Take 1,000 mg by mouth 2 (two) times daily.          fluticasone 50 MCG/ACT nasal spray Commonly known as: FLONASE Place 2 sprays into both nostrils daily as needed for allergies.          folic acid 992 MCG tablet Commonly known as: FOLVITE Take 800 mcg by mouth at bedtime.  gabapentin 100 MG capsule Commonly known as: NEURONTIN Take 100 mg by mouth See admin instructions. Take 100 mg twice daily, make take a third 100 mg dose as needed for pain         Icon medications to change how you take   HYDROcodone-acetaminophen 5-325 MG tablet Commonly known as: NORCO/VICODIN Take 0.5-1 tablets by mouth every 6 (six) hours as needed for up to 7 days for moderate pain or severe pain. What changed:  when to take this reasons to take this          lisinopril 10 MG tablet Commonly known as: ZESTRIL Take 1 tablet (10 mg total) by mouth at  bedtime.          Melatonin 5 MG Caps Take 5 mg by mouth at bedtime as needed (sleep).          meloxicam 15 MG tablet Commonly known as: MOBIC Take 1 tablet (15 mg total) by mouth daily.          methocarbamol 500 MG tablet Commonly known as: ROBAXIN TAKE 1 TO 2 TABLETS BY MOUTH EVERY 6 HOURS AS NEEDED FOR MUSCLE SPASMS         Icon medications to start taking   metoprolol tartrate 25 MG tablet Commonly known as: LOPRESSOR Take 0.5 tablets (12.5 mg total) by mouth 2 (two) times daily.          montelukast 10 MG tablet Commonly known as: SINGULAIR Take tablet by mouth daily.          multivitamin with minerals Tabs tablet Take 1 tablet by mouth at bedtime.         Icon medications to start taking   nicotine 21 mg/24hr patch Commonly known as: NICODERM CQ - dosed in mg/24 hours Start taking on: January 23, 2022 Place 1 patch (21 mg total) onto the skin daily at 6 (six) AM.          omeprazole 40 MG capsule Commonly known as: PRILOSEC Take 40 mg by mouth daily as needed (acid reflux).          OSTEO BI-FLEX REGULAR STRENGTH PO Take 1 tablet by mouth 2 (two) times daily.          OVER THE COUNTER MEDICATION Take 1 tablet by mouth at bedtime. Vitamin d3 with MK7          OVER THE COUNTER MEDICATION Take 1 drop by mouth daily. nascent iodine liquid, 1 drop in 8 oz of water daily          PROBIOTIC PO Take 1 tablet by mouth daily.          Vitamin E 450 MG (1000 UT) Caps Take 1,000 Units by mouth at bedtime.            Follow-up Information     Gaye Pollack, MD Follow up.   Specialty: Cardiothoracic Surgery Why: Please see discharge paperwork for follow-up appointment with surgeon.  You will also an appointment first for suture removal by the office nurse.  On the date you see Dr. Cyndia Bent, obtain a chest x-ray from [facility] 1/2-hour prior to the visit.  It is located in the same office complex on the first floor. Contact information: [address] [place] [address] [phone]         Sueanne Margarita, MD Follow up.   Specialty: Cardiology Why: Please see discharge paperwork for follow-up appointment with cardiology. Contact information: [address] [place] [address] [phone]               Signed: John Giovanni, Hershal Coria  01/22/2022, 9:28 AM

## 2022-01-22 NOTE — Progress Notes (Signed)
[address]       [place] [address]             [phone]      4 Days Post-Op Procedure(s) (LRB): AORTIC VALVE REPLACEMENT (AVR) USING INSPIRIS VALVE SIZE 19MM (N/A) TRANSESOPHAGEAL ECHOCARDIOGRAM (TEE) (N/A) Subjective: Feels well  Objective: Vital signs in last 24 hours: Temp:  [97.8 F (36.6 C)-98.3 F (36.8 C)] 98 F (36.7 C) (02/28 0449) Pulse Rate:  [74-91] 84 (02/28 0449) Cardiac Rhythm: Sinus tachycardia (02/27 1900) Resp:  [15-20] 20 (02/28 0449) BP: (128-162)/(63-85) 149/77 (02/28 0449) SpO2:  [94 %-97 %] 94 % (02/28 0449) Weight:  [52.2 kg] 52.2 kg (02/28 0449)  Hemodynamic parameters for last 24 hours:    Intake/Output from previous day: 02/27 0701 - 02/28 0700 In: 3 [I.V.:3] Out: 1851 [Urine:1850; Stool:1] Intake/Output this shift: No intake/output data recorded.  General appearance: alert, cooperative, and no distress Heart: regular rate and rhythm and soft systolic murmur Lungs: clear to auscultation bilaterally Abdomen: benign Extremities: no edema Wound: incis healing well  Lab Results: Recent Labs    01/20/22 0442 01/21/22 0215  WBC 11.3* 10.5  HGB 8.8* 8.6*  HCT 26.4* 25.4*  PLT 104* 100*   BMET:  Recent Labs    01/20/22 0442 01/21/22 0215  NA 132* 133*  K 4.2 3.7  CL 101 98  CO2 24 27  GLUCOSE 93 115*  BUN 7* 9  CREATININE 0.83 0.89  CALCIUM 8.4* 8.5*    PT/INR: No results for input(s): LABPROT, INR in the last 72 hours. ABG    Component Value Date/Time   PHART 7.292 (L) 01/18/2022 2138   HCO3 24.4 01/18/2022 2138   TCO2 26 01/18/2022 2138   ACIDBASEDEF 2.0 01/18/2022 2138   O2SAT 99 01/18/2022 2138   CBG (last 3)  Recent Labs    01/20/22 0337 01/20/22 0723 01/20/22 1116  GLUCAP 86 132* 92    Meds Scheduled Meds:  acetaminophen  1,000 mg Oral Q6H   Or   acetaminophen (TYLENOL) oral liquid 160 mg/5 mL  1,000 mg Per Tube Q6H   aspirin EC  325 mg Oral Daily   Or   aspirin  324 mg  Per Tube Daily   bisacodyl  10 mg Oral Daily   Or   bisacodyl  10 mg Rectal Daily   chlorhexidine gluconate (MEDLINE KIT)  15 mL Mouth Rinse BID   Chlorhexidine Gluconate Cloth  6 each Topical Daily   cycloSPORINE  1 drop Both Eyes BID   docusate sodium  200 mg Oral Daily   DULoxetine  60 mg Oral Daily   ferrous [id number] C-folic acid  1 capsule Oral BID PC   fluticasone  2 spray Each Nare Daily   furosemide  40 mg Oral Daily   lisinopril  10 mg Oral Daily   loratadine  10 mg Oral Daily   metoprolol tartrate  12.5 mg Oral BID   montelukast  10 mg Oral QHS   nicotine  21 mg Transdermal Q0600   pantoprazole  40 mg Oral Daily   potassium chloride  20 mEq Oral BID   scopolamine  1 patch Transdermal Q72H   sodium chloride flush  10-40 mL Intracatheter Q12H   sodium chloride flush  3 mL Intravenous Q12H   Continuous Infusions:  sodium chloride Stopped (01/18/22 1312)   sodium chloride 250 mL (01/19/22 0749)   sodium chloride 10 mL/hr at 01/18/22 1220   promethazine (PHENERGAN) injection (  IM or IVPB) Stopped (01/19/22 1705)   PRN Meds:.sodium chloride, cyclobenzaprine, metoprolol tartrate, ondansetron (ZOFRAN) IV, oxyCODONE, promethazine (PHENERGAN) injection (IM or IVPB), sodium chloride flush, sodium chloride flush, temazepam, traMADol  Xrays DG Chest Port 1 View  Result Date: 01/21/2022 CLINICAL DATA:  Aortic valve replacement EXAM: PORTABLE CHEST 1 VIEW COMPARISON:  01/20/2022 FINDINGS: All support apparatus removed. Median sternotomy noted for aortic valve replacement. Small left effusion with left basilar atelectasis/partial collapse. Trachea midline. Query trace right apical pneumothorax with the pleural surface superimposed over rib shadows. No acute osseous finding.  Nonobstructive bowel gas pattern. IMPRESSION: Persistent left pleural effusion and left lower lobe atelectasis/partial collapse. Query trace right apical pneumothorax. Electronically Signed   By: Jerilynn Mages.  Shick  M.D.   On: 01/21/2022 08:29    Assessment/Plan: S/P Procedure(s) (LRB): AORTIC VALVE REPLACEMENT (AVR) USING INSPIRIS VALVE SIZE 19MM (N/A) TRANSESOPHAGEAL ECHOCARDIOGRAM (TEE) (N/A) POD#4  1 afeb, VSS, s BP 120's-160's, back on lisinopril- may need additional agent as outpatient- cont same for now, sinus rhythm 2 sats good on RA 3 no new labs /xrays 4 stable for d/c    LOS: 4 days    John Giovanni PA-C Pager [phone] 01/22/2022

## 2022-01-22 NOTE — Progress Notes (Signed)
CARDIAC REHAB PHASE I   D/c education completed with pt. Pt educated on importance of site care and monitoring incision daily. Encouraged continued IS use, walks, and sternal precautions. Pt given in-the-tube sheet along with heart healthy diet. Reviewed restrictions and exercise guidelines. Pt denies further questions or concerns at this time. Will refer to CRP II [place].   [phone] Rufina Falco, RN BSN 01/22/2022 9:05 AM

## 2022-01-22 NOTE — Care Management Important Message (Signed)
Important Message  Patient Details  Name: Jessica Ramsey MRN: [id number] Date of Birth: 05-06-1956   Medicare Important Message Given:  Yes     Shelda Altes 01/22/2022, 8:35 AM

## 2022-01-22 NOTE — Progress Notes (Signed)
Patient ambulated independently in hall this evening. Patient also reports having bowel movement tonight.

## 2022-01-23 DIAGNOSIS — Z48812 Encounter for surgical aftercare following surgery on the circulatory system: Secondary | ICD-10-CM | POA: Diagnosis not present

## 2022-01-24 MED FILL — Sodium Bicarbonate IV Soln 8.4%: INTRAVENOUS | Qty: 50 | Status: AC

## 2022-01-24 MED FILL — Heparin Sodium (Porcine) Inj 1000 Unit/ML: Qty: 1000 | Status: AC

## 2022-01-24 MED FILL — Heparin Sodium (Porcine) Inj 1000 Unit/ML: INTRAMUSCULAR | Qty: 10 | Status: AC

## 2022-01-24 MED FILL — Sodium Chloride IV Soln 0.9%: INTRAVENOUS | Qty: 2000 | Status: AC

## 2022-01-24 MED FILL — Potassium Chloride Inj 2 mEq/ML: INTRAVENOUS | Qty: 20 | Status: AC

## 2022-01-24 MED FILL — Lidocaine HCl Local Preservative Free (PF) Inj 2%: INTRAMUSCULAR | Qty: 15 | Status: AC

## 2022-01-24 MED FILL — Electrolyte-R (PH 7.4) Solution: INTRAVENOUS | Qty: 4000 | Status: AC

## 2022-01-28 ENCOUNTER — Telehealth: Payer: Self-pay

## 2022-01-28 NOTE — Telephone Encounter (Signed)
Patient's daughter, Morey Hummingbird contacted the office to state her mother's previous chest tube site is currently "oozing" "old looking blood". She states that it started last Friday and she has experienced some drainage daily. States that two of the three sites have steri-strips still attached but the one that is draining does not. She states that she has not done anything at home to warrant the dehiscence. Advised that some drainage from the chest tube site is normal and should decrease and no concern at this point. Also advised that if she continues to drain or if the drainage consistency, color, smell or if she began to run a temperature to call the office back. She states that patient's home health RN is coming to the home today and she would ask for them to take a look at it as well. Advised to call back if any changes.  ?
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
## 2022-02-07 ENCOUNTER — Other Ambulatory Visit: Payer: Self-pay

## 2022-02-07 ENCOUNTER — Ambulatory Visit (INDEPENDENT_AMBULATORY_CARE_PROVIDER_SITE_OTHER): Payer: PPO | Admitting: Nurse Practitioner

## 2022-02-07 ENCOUNTER — Encounter: Payer: Self-pay | Admitting: Nurse Practitioner

## 2022-02-07 ENCOUNTER — Other Ambulatory Visit (HOSPITAL_COMMUNITY): Payer: Self-pay

## 2022-02-07 VITALS — BP 116/62 | HR 67 | Ht 59.5 in | Wt 111.0 lb

## 2022-02-07 DIAGNOSIS — I351 Nonrheumatic aortic (valve) insufficiency: Secondary | ICD-10-CM | POA: Diagnosis not present

## 2022-02-07 DIAGNOSIS — I350 Nonrheumatic aortic (valve) stenosis: Secondary | ICD-10-CM

## 2022-02-07 DIAGNOSIS — I10 Essential (primary) hypertension: Secondary | ICD-10-CM | POA: Diagnosis not present

## 2022-02-07 DIAGNOSIS — Z7189 Other specified counseling: Secondary | ICD-10-CM

## 2022-02-07 DIAGNOSIS — F17200 Nicotine dependence, unspecified, uncomplicated: Secondary | ICD-10-CM

## 2022-02-07 LAB — BASIC METABOLIC PANEL
BUN/Creatinine Ratio: 22 (ref 12–28)
BUN: 18 mg/dL (ref 8–27)
CO2: 26 mmol/L (ref 20–29)
Calcium: 9.9 mg/dL (ref 8.7–10.3)
Chloride: 103 mmol/L (ref 96–106)
Creatinine, Ser: 0.82 mg/dL (ref 0.57–1.00)
Glucose: 69 mg/dL — ABNORMAL LOW (ref 70–99)
Potassium: 4.7 mmol/L (ref 3.5–5.2)
Sodium: 142 mmol/L (ref 134–144)
eGFR: 80 mL/min/{1.73_m2} (ref >59)

## 2022-02-07 LAB — CBC
Hematocrit: 33.2 % — ABNORMAL LOW (ref 34.0–46.6)
Hemoglobin: 11.4 g/dL (ref 11.1–15.9)
MCH: 31.4 pg (ref 26.6–33.0)
MCHC: 34.3 g/dL (ref 31.5–35.7)
MCV: 92 fL (ref 79–97)
Platelets: 461 10*3/uL — ABNORMAL HIGH (ref 150–450)
RBC: 3.63 x10E6/uL — ABNORMAL LOW (ref 3.77–5.28)
RDW: 12.6 % (ref 11.7–15.4)
WBC: 7.9 10*3/uL (ref 3.4–10.8)

## 2022-02-07 MED ORDER — METHOCARBAMOL 500 MG PO TABS
ORAL_TABLET | ORAL | 1 refills | Status: DC
  Filled 2022-02-07: qty 180, 90d supply, fill #0

## 2022-02-07 MED ORDER — LISINOPRIL 10 MG PO TABS
10.0000 mg | ORAL_TABLET | Freq: Every day | ORAL | 3 refills | Status: AC
  Filled 2022-02-07: qty 90, 90d supply, fill #0

## 2022-02-07 MED ORDER — METOPROLOL TARTRATE 25 MG PO TABS
12.5000 mg | ORAL_TABLET | Freq: Two times a day (BID) | ORAL | 3 refills | Status: DC
  Filled 2022-02-07: qty 90, 90d supply, fill #0
  Filled 2022-05-20: qty 90, 90d supply, fill #1

## 2022-02-07 MED ORDER — GABAPENTIN 100 MG PO CAPS
ORAL_CAPSULE | ORAL | 1 refills | Status: DC
  Filled 2022-02-07: qty 270, 90d supply, fill #0
  Filled 2022-05-29: qty 270, 90d supply, fill #1

## 2022-02-07 MED ORDER — ASPIRIN EC 81 MG PO TBEC: 81.0000 mg | DELAYED_RELEASE_TABLET | Freq: Every day | ORAL | 3 refills | Status: AC

## 2022-02-07 NOTE — Patient Instructions (Signed)
Medication Instructions:  ?START Aspirin '81mg'$  daily ? ?*If you need a refill on your cardiac medications before your next appointment, please call your pharmacy* ? ? ?Lab Work: ?CBC, BMET Today ?If you have labs (blood work) drawn today and your tests are completely normal, you will receive your results only by: ?MyChart Message (if you have MyChart) OR ?A paper copy in the mail ?If you have any lab test that is abnormal or we need to change your treatment, we will call you to review the results. ? ? ?Testing/Procedures: ?NONE ? ? ?Follow-Up: ?At [facility], you and your health needs are our priority.  As part of our continuing mission to provide you with exceptional heart care, we have created designated Provider Care Teams.  These Care Teams include your primary Cardiologist (physician) and Advanced Practice Providers (APPs -  Physician Assistants and Nurse Practitioners) who all work together to provide you with the care you need, when you need it. ? ? ?Your next appointment:   ?Wednesday, March 13, 2022 '@10'$ :15a ? ?Provider:   ?[name], NP ?   ?

## 2022-02-08 ENCOUNTER — Encounter: Payer: Self-pay | Admitting: Surgery

## 2022-02-11 ENCOUNTER — Telehealth: Payer: Self-pay | Admitting: *Deleted

## 2022-02-11 NOTE — Telephone Encounter (Signed)
Patient contacted the office requesting to drive s/p AVR 9/98 by Dr. Cyndia Bent. Per PA, patient advised to wait until scheduled follow up appt on 3/29 before returning to drive. Patient verbalized understanding.  ?

## 2022-02-14 ENCOUNTER — Other Ambulatory Visit (HOSPITAL_COMMUNITY): Payer: Self-pay

## 2022-02-18 ENCOUNTER — Other Ambulatory Visit (HOSPITAL_COMMUNITY): Payer: Self-pay | Admitting: Physical Medicine and Rehabilitation

## 2022-02-18 DIAGNOSIS — Z1231 Encounter for screening mammogram for malignant neoplasm of breast: Secondary | ICD-10-CM

## 2022-02-19 ENCOUNTER — Other Ambulatory Visit: Payer: Self-pay | Admitting: Surgery

## 2022-02-19 DIAGNOSIS — Z952 Presence of prosthetic heart valve: Secondary | ICD-10-CM

## 2022-02-20 ENCOUNTER — Ambulatory Visit (INDEPENDENT_AMBULATORY_CARE_PROVIDER_SITE_OTHER): Payer: Self-pay | Admitting: Surgery

## 2022-02-20 ENCOUNTER — Encounter: Payer: Self-pay | Admitting: Surgery

## 2022-02-20 ENCOUNTER — Other Ambulatory Visit: Payer: Self-pay

## 2022-02-20 ENCOUNTER — Ambulatory Visit
Admission: RE | Admit: 2022-02-20 | Discharge: 2022-02-20 | Disposition: A | Discharge status: Home / Self Care | Payer: PPO | Source: Ambulatory Visit | Attending: Surgery | Admitting: Surgery

## 2022-02-20 ENCOUNTER — Encounter: Payer: Self-pay | Admitting: *Deleted

## 2022-02-20 VITALS — BP 174/85 | HR 66 | Resp 20 | Ht 59.0 in | Wt 113.0 lb

## 2022-02-20 DIAGNOSIS — Z952 Presence of prosthetic heart valve: Secondary | ICD-10-CM

## 2022-02-20 NOTE — Progress Notes (Signed)
? ?HPI: ?Patient returns for routine postoperative follow-up having undergone aortic valve replacement using a 19 mm Edwards INSPIRIS RESILIA pericardial valve on 01/18/2022. ?The patient's early postoperative recovery while in the hospital was notable for an uncomplicated postoperative course. ?Since hospital discharge the patient reports that she feels great.  She is ambulating daily without chest pain or shortness of breath.  Her stamina continues to improve. ? ? ?Current Outpatient Medications  ?Medication Sig Dispense Refill  ? acetaminophen (TYLENOL) 500 MG tablet Take 1,000 mg by mouth 3 (three) times daily as needed for mild pain or moderate pain.    ? acyclovir (ZOVIRAX) 400 MG tablet Take 1-2 tablets (400-800 mg total) by mouth 2 (two) times daily as needed (outbreaks).    ? aspirin EC 81 MG tablet Take 1 tablet (81 mg total) by mouth daily. Swallow whole. 90 tablet 3  ? Biotin 1000 MCG tablet Take 1,000 mcg by mouth daily.    ? cetirizine (ZYRTEC) 10 MG tablet Take 10 mg by mouth daily as needed for allergies.    ? Cyanocobalamin (B-12) 5000 MCG CAPS Take 5,000 mcg by mouth daily.    ? cycloSPORINE (RESTASIS) 0.05 % ophthalmic emulsion Place 1 drop into both eyes 2 (two) times daily.    ? DULoxetine (CYMBALTA) 60 MG capsule Take 1 capsule (60 mg total) by mouth daily. Only take 1 pill due to it being 60 mg and not 30 mg. 90 capsule 3  ? EPINEPHrine 0.3 mg/0.3 mL IJ SOAJ injection Inject 0.3 mg into the muscle as needed for anaphylaxis.    ? Eszopiclone 3 MG TABS Take 3 mg by mouth at bedtime as needed (sleep). Take immediately before bedtime    ? Flaxseed, Linseed, (FLAXSEED OIL PO) Take 1,000 mg by mouth 2 (two) times daily.    ? fluticasone (FLONASE) 50 MCG/ACT nasal spray Place 2 sprays into both nostrils daily as needed for allergies.     ? folic acid (FOLVITE) 540 MCG tablet Take 800 mcg by mouth at bedtime.    ? gabapentin (NEURONTIN) 100 MG capsule Take 100 mg by mouth See admin instructions. Take  100 mg twice daily, make take a third 100 mg dose as needed for pain    ? gabapentin (NEURONTIN) 100 MG capsule Take 1 capsule by mouth 3 times daily. 270 capsule 1  ? Glucosamine-Chondroitin (OSTEO BI-FLEX REGULAR STRENGTH PO) Take 1 tablet by mouth 2 (two) times daily.    ? lisinopril (ZESTRIL) 10 MG tablet Take 1 tablet (10 mg total) by mouth at bedtime. 90 tablet 3  ? Melatonin 5 MG CAPS Take 5 mg by mouth at bedtime as needed (sleep).    ? meloxicam (MOBIC) 15 MG tablet Take 1 tablet (15 mg total) by mouth daily. 90 tablet 3  ? methocarbamol (ROBAXIN) 500 MG tablet TAKE 1 TO 2 TABLETS BY MOUTH EVERY 6 HOURS AS NEEDED FOR MUSCLE SPASMS 60 tablet 2  ? metoprolol tartrate (LOPRESSOR) 25 MG tablet Take 1/2 tablet (12.5 mg total) by mouth 2 (two) times daily. 90 tablet 3  ? montelukast (SINGULAIR) 10 MG tablet Take tablet by mouth daily. 90 tablet 3  ? Multiple Vitamin (MULTIVITAMIN WITH MINERALS) TABS tablet Take 1 tablet by mouth at bedtime.    ? nicotine (NICODERM CQ - DOSED IN MG/24 HOURS) 21 mg/24hr patch Place 1 patch (21 mg total) onto the skin daily at 6 (six) AM. 28 patch 0  ? Omega-3 Fatty Acids (FISH OIL PO) Take 1,500 mg  by mouth 2 (two) times daily.    ? omeprazole (PRILOSEC) 40 MG capsule Take 40 mg by mouth daily as needed (acid reflux).    ? OVER THE COUNTER MEDICATION Take 1 tablet by mouth at bedtime. Vitamin d3 with MK7    ? OVER THE COUNTER MEDICATION Take 1 drop by mouth daily. nascent iodine liquid, 1 drop in 8 oz of water daily    ? Probiotic Product (PROBIOTIC PO) Take 1 tablet by mouth daily.     ? Vitamin E 450 MG (1000 UT) CAPS Take 1,000 Units by mouth at bedtime.    ? ?No current facility-administered medications for this visit.  ? ? ?Physical Exam: ?BP (!) 174/85 (BP Location: Left Arm, Patient Position: Sitting)   Pulse 66   Resp 20   Ht '4\' 11"'$  (1.499 m)   Wt 113 lb (51.3 kg)   LMP 11/25/2001 (Exact Date)   SpO2 98% Comment: RA  BMI 22.82 kg/m?  ?She looks well. ?Cardiac exam  shows a regular rate and rhythm with normal heart sounds.  There is no murmur. ?Lungs are clear. ?The chest incision is healing well and the sternum is stable. ?There is no peripheral edema. ? ?Diagnostic Tests: ? ?Narrative & Impression  ?CLINICAL DATA:  66 year old female status post aortic valve ?replacement. ?  ?EXAM: ?CHEST - 2 VIEW ?  ?COMPARISON:  01/21/2022 and earlier. ?  ?FINDINGS: ?Improved lung volumes from last month and resolved pleural ?effusion(s). Normal cardiac size and mediastinal contours. Sequelae ?of sternotomy and aortic valve replacement. Both lungs appear clear. ?No pneumothorax. ?  ?Cervical ACDF. No acute osseous abnormality identified. Negative ?visible bowel gas. ?  ?IMPRESSION: ?Resolved postoperative pneumothorax and pleural effusion. No acute ?cardiopulmonary abnormality. ?  ?  ?Electronically Signed ?  By: Genevie Ann M.D. ?  On: 02/20/2022 08:43  ? ? ?Impression: ? ?Overall she is doing very well following her valve replacement surgery.  I told her that she could return to driving a car.  I asked her not to lift anything heavier than 10 pounds for 3 months postoperatively.  Her blood pressure is somewhat elevated today but she was seen by cardiology on 02/07/2022 and it was recorded at 116/62.  I asked her to get a blood pressure cuff to monitor her pressure at home and to keep a chart. ? ?Plan: ? ?She will continue to follow-up with cardiology and her PCP and will contact me if she develops any problems with her incisions. ? ? ?Gaye Pollack, MD ?[facility] ?[phone] ?

## 2022-03-01 ENCOUNTER — Ambulatory Visit (HOSPITAL_COMMUNITY): Payer: PPO | Attending: Cardiology

## 2022-03-01 DIAGNOSIS — I350 Nonrheumatic aortic (valve) stenosis: Secondary | ICD-10-CM | POA: Diagnosis present

## 2022-03-01 LAB — ECHOCARDIOGRAM COMPLETE
AV Mean grad: 8.2 mmHg
AV Peak grad: 14.9 mmHg
Ao pk vel: 1.93 m/s
Area-P 1/2: 3.31 cm2
S' Lateral: 2.5 cm

## 2022-03-04 ENCOUNTER — Ambulatory Visit (HOSPITAL_COMMUNITY)
Admission: RE | Admit: 2022-03-04 | Discharge: 2022-03-04 | Disposition: A | Discharge status: Home / Self Care | Payer: PPO | Source: Ambulatory Visit | Attending: Physical Medicine and Rehabilitation | Admitting: Physical Medicine and Rehabilitation

## 2022-03-04 ENCOUNTER — Encounter: Payer: Self-pay | Admitting: Cardiology

## 2022-03-04 DIAGNOSIS — Z1231 Encounter for screening mammogram for malignant neoplasm of breast: Secondary | ICD-10-CM | POA: Diagnosis present
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
## 2022-03-12 ENCOUNTER — Other Ambulatory Visit (HOSPITAL_COMMUNITY): Payer: Self-pay

## 2022-03-12 MED ORDER — METOPROLOL TARTRATE 25 MG PO TABS
ORAL_TABLET | ORAL | 0 refills | Status: DC
  Filled 2022-03-12: qty 1, 1d supply, fill #0

## 2022-03-12 MED ORDER — ESZOPICLONE 3 MG PO TABS
ORAL_TABLET | ORAL | 1 refills | Status: AC
  Filled 2022-03-12 – 2022-04-01 (×2): qty 90, 90d supply, fill #0

## 2022-03-13 ENCOUNTER — Ambulatory Visit: Payer: PPO | Admitting: Nurse Practitioner

## 2022-03-13 ENCOUNTER — Encounter: Payer: Self-pay | Admitting: Nurse Practitioner

## 2022-03-13 VITALS — BP 148/80 | HR 59 | Ht 59.0 in | Wt 112.8 lb

## 2022-03-13 DIAGNOSIS — Z952 Presence of prosthetic heart valve: Secondary | ICD-10-CM | POA: Diagnosis not present

## 2022-03-13 DIAGNOSIS — I350 Nonrheumatic aortic (valve) stenosis: Secondary | ICD-10-CM

## 2022-03-13 DIAGNOSIS — I10 Essential (primary) hypertension: Secondary | ICD-10-CM

## 2022-03-13 DIAGNOSIS — F17200 Nicotine dependence, unspecified, uncomplicated: Secondary | ICD-10-CM | POA: Diagnosis not present

## 2022-03-13 DIAGNOSIS — I5032 Chronic diastolic (congestive) heart failure: Secondary | ICD-10-CM

## 2022-03-13 DIAGNOSIS — G8929 Other chronic pain: Secondary | ICD-10-CM

## 2022-03-13 NOTE — Patient Instructions (Addendum)
Medication Instructions:  ? ?Your physician recommends that you continue on your current medications as directed. Please refer to the Current Medication list given to you today. ? ?*If you need a refill on your cardiac medications before your next appointment, please call your pharmacy* ? ? ?Follow-Up: ?At [facility], you and your health needs are our priority.  As part of our continuing mission to provide you with exceptional heart care, we have created designated Provider Care Teams.  These Care Teams include your primary Cardiologist (physician) and Advanced Practice Providers (APPs -  Physician Assistants and Nurse Practitioners) who all work together to provide you with the care you need, when you need it. ? ?We recommend signing up for the patient portal called "MyChart".  Sign up information is provided on this After Visit Summary.  MyChart is used to connect with patients for Virtual Visits (Telemedicine).  Patients are able to view lab/test results, encounter notes, upcoming appointments, etc.  Non-urgent messages can be sent to your provider as well.   ?To learn more about what you can do with MyChart, go to [email or web address].   ? ?Your next appointment:   ?6 month(s) ? ?The format for your next appointment:   ?In Person ? ?Provider:   ?[name], NP       ? ? ?Other Instructions ? ?Please call office if your blood pressure consistently stays above 130/80 x 3 days.   ? ?Important Information About Sugar ? ? ? ? ?  ?

## 2022-04-01 ENCOUNTER — Other Ambulatory Visit (HOSPITAL_COMMUNITY): Payer: Self-pay

## 2022-04-12 ENCOUNTER — Ambulatory Visit (INDEPENDENT_AMBULATORY_CARE_PROVIDER_SITE_OTHER): Payer: PPO | Admitting: Obstetrics & Gynecology

## 2022-04-12 ENCOUNTER — Other Ambulatory Visit (HOSPITAL_COMMUNITY): Payer: Self-pay

## 2022-04-12 ENCOUNTER — Other Ambulatory Visit (HOSPITAL_COMMUNITY)
Admission: RE | Admit: 2022-04-12 | Discharge: 2022-04-12 | Disposition: A | Discharge status: Home / Self Care | Payer: PPO | Source: Ambulatory Visit | Attending: Obstetrics & Gynecology | Admitting: Obstetrics & Gynecology

## 2022-04-12 ENCOUNTER — Encounter (HOSPITAL_BASED_OUTPATIENT_CLINIC_OR_DEPARTMENT_OTHER): Payer: Self-pay | Admitting: Obstetrics & Gynecology

## 2022-04-12 VITALS — BP 142/75 | HR 62 | Ht 59.5 in | Wt 113.0 lb

## 2022-04-12 DIAGNOSIS — Z1151 Encounter for screening for human papillomavirus (HPV): Secondary | ICD-10-CM | POA: Diagnosis not present

## 2022-04-12 DIAGNOSIS — Z952 Presence of prosthetic heart valve: Secondary | ICD-10-CM

## 2022-04-12 DIAGNOSIS — Z9189 Other specified personal risk factors, not elsewhere classified: Secondary | ICD-10-CM | POA: Diagnosis not present

## 2022-04-12 DIAGNOSIS — Z124 Encounter for screening for malignant neoplasm of cervix: Secondary | ICD-10-CM | POA: Diagnosis not present

## 2022-04-12 DIAGNOSIS — Z780 Asymptomatic menopausal state: Secondary | ICD-10-CM | POA: Diagnosis not present

## 2022-04-12 DIAGNOSIS — Z8742 Personal history of other diseases of the female genital tract: Secondary | ICD-10-CM

## 2022-04-12 DIAGNOSIS — Z8619 Personal history of other infectious and parasitic diseases: Secondary | ICD-10-CM | POA: Diagnosis not present

## 2022-04-12 DIAGNOSIS — A609 Anogenital herpesviral infection, unspecified: Secondary | ICD-10-CM

## 2022-04-12 MED ORDER — ACYCLOVIR 400 MG PO TABS: 400.0000 mg | ORAL_TABLET | Freq: Two times a day (BID) | ORAL | 4 refills | Status: DC

## 2022-04-12 MED ORDER — ACYCLOVIR 5 % EX OINT
1.0000 "application " | TOPICAL_OINTMENT | CUTANEOUS | 2 refills | Status: AC
  Filled 2022-04-12: qty 15, 30d supply, fill #0
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
## 2022-04-15 ENCOUNTER — Other Ambulatory Visit (HOSPITAL_COMMUNITY): Payer: Self-pay

## 2022-04-15 ENCOUNTER — Encounter (HOSPITAL_COMMUNITY): Payer: PPO

## 2022-04-16 ENCOUNTER — Encounter (HOSPITAL_COMMUNITY)
Admission: RE | Admit: 2022-04-16 | Discharge: 2022-04-16 | Disposition: A | Discharge status: Home / Self Care | Payer: PPO | Source: Ambulatory Visit | Attending: Cardiology | Admitting: Cardiology

## 2022-04-16 ENCOUNTER — Other Ambulatory Visit (HOSPITAL_COMMUNITY): Payer: Self-pay

## 2022-04-16 ENCOUNTER — Other Ambulatory Visit (HOSPITAL_BASED_OUTPATIENT_CLINIC_OR_DEPARTMENT_OTHER): Payer: Self-pay | Admitting: Obstetrics & Gynecology

## 2022-04-16 ENCOUNTER — Encounter (HOSPITAL_COMMUNITY): Payer: Self-pay

## 2022-04-16 VITALS — BP 102/68 | HR 51 | Ht 59.5 in | Wt 111.1 lb

## 2022-04-16 DIAGNOSIS — Z952 Presence of prosthetic heart valve: Secondary | ICD-10-CM | POA: Diagnosis present

## 2022-04-16 DIAGNOSIS — A609 Anogenital herpesviral infection, unspecified: Secondary | ICD-10-CM

## 2022-04-16 LAB — CYTOLOGY - PAP
Comment: NEGATIVE
Comment: NEGATIVE
Comment: NEGATIVE
Diagnosis: HIGH — AB
HPV 16: NEGATIVE
HPV 18 / 45: NEGATIVE
High risk HPV: POSITIVE — AB
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
## 2022-04-17 ENCOUNTER — Other Ambulatory Visit (HOSPITAL_COMMUNITY): Payer: Self-pay

## 2022-04-17 MED ORDER — ACYCLOVIR 400 MG PO TABS
400.0000 mg | ORAL_TABLET | Freq: Two times a day (BID) | ORAL | 4 refills | Status: DC
  Filled 2022-04-17: qty 180, 90d supply, fill #0

## 2022-04-19 ENCOUNTER — Encounter (HOSPITAL_COMMUNITY): Payer: PPO

## 2022-04-23 ENCOUNTER — Other Ambulatory Visit (HOSPITAL_COMMUNITY): Payer: Self-pay

## 2022-04-24 ENCOUNTER — Encounter (HOSPITAL_COMMUNITY)
Admission: RE | Admit: 2022-04-24 | Discharge: 2022-04-24 | Disposition: A | Discharge status: Home / Self Care | Payer: PPO | Source: Ambulatory Visit | Attending: Family Medicine | Admitting: Family Medicine

## 2022-04-24 DIAGNOSIS — Z952 Presence of prosthetic heart valve: Secondary | ICD-10-CM

## 2022-04-24 NOTE — Progress Notes (Signed)
Daily Session Note  Patient Details  Name: FALISA LAMORA MRN: [id number] Date of Birth: 1956-11-14 Referring Provider:   Flowsheet Row CARDIAC REHAB PHASE II ORIENTATION from 04/16/2022 in [place]  Referring Provider Dr. Cyndia Bent       Encounter Date: 04/24/2022  Check In:  Session Check In - 04/24/22 1445       Check-In   Supervising physician immediately available to respond to emergencies [facility] MD immediately available    Physician(s) Dr Harl Bowie    Location [facility]    Staff Present Aundra Dubin, RN, Joanette Gula, RN, Bjorn Loser, MS, ACSM-CEP, Exercise Physiologist    Virtual Visit No    Medication changes reported     No    Fall or balance concerns reported    No    Warm-up and Cool-down Performed as group-led instruction    Resistance Training Performed Yes    VAD Patient? No    PAD/SET Patient? No      Pain Assessment   Currently in Pain? No/denies    Pain Score 0-No pain    Multiple Pain Sites No             Capillary Blood Glucose: No results found for this or any previous visit (from the past 24 hour(s)).    Social History   Tobacco Use  Smoking Status Former   Packs/day: 0.25   Years: 40.00   Pack years: 10.00   Types: Cigarettes   Quit date: 09/25/2017   Years since quitting: 4.5  Smokeless Tobacco Never  Tobacco Comments   5 cigarettes per day    Goals Met:  Independence with exercise equipment Exercise tolerated well No report of concerns or symptoms today Strength training completed today  Goals Unmet:  Not Applicable  Comments: checkout at 1545.   Dr. Carlyle Dolly is Medical Director for [facility] Cardiac Rehab

## 2022-04-26 ENCOUNTER — Encounter (HOSPITAL_COMMUNITY): Payer: PPO

## 2022-05-01 ENCOUNTER — Encounter (HOSPITAL_COMMUNITY)
Admission: RE | Admit: 2022-05-01 | Discharge: 2022-05-01 | Disposition: A | Discharge status: Home / Self Care | Payer: PPO | Source: Ambulatory Visit | Attending: Family Medicine | Admitting: Family Medicine

## 2022-05-01 DIAGNOSIS — Z952 Presence of prosthetic heart valve: Secondary | ICD-10-CM | POA: Diagnosis present

## 2022-05-01 NOTE — Progress Notes (Signed)
Daily Session Note  Patient Details  Name: LORAINA STAUFFER MRN: [id number] Date of Birth: 1956-09-23 Referring Provider:   Flowsheet Row CARDIAC REHAB PHASE II ORIENTATION from 04/16/2022 in [place]  Referring Provider Dr. Cyndia Bent       Encounter Date: 05/01/2022  Check In:  Session Check In - 05/01/22 1438       Check-In   Supervising physician immediately available to respond to emergencies [facility] MD immediately available    Physician(s) Dr Johnsie Cancel    Location [facility]    Staff Present Shavette Shoaff Hassell Done, RN, Bjorn Loser, MS, ACSM-CEP, Exercise Physiologist;Heather Zigmund Daniel, Exercise Physiologist    Virtual Visit No    Medication changes reported     No    Fall or balance concerns reported    No    Warm-up and Cool-down Performed as group-led instruction    Resistance Training Performed Yes    VAD Patient? No    PAD/SET Patient? No      Pain Assessment   Currently in Pain? No/denies    Pain Score 0-No pain    Multiple Pain Sites No             Capillary Blood Glucose: No results found for this or any previous visit (from the past 24 hour(s)).    Social History   Tobacco Use  Smoking Status Former   Packs/day: 0.25   Years: 40.00   Pack years: 10.00   Types: Cigarettes   Quit date: 09/25/2017   Years since quitting: 4.6  Smokeless Tobacco Never  Tobacco Comments   5 cigarettes per day    Goals Met:  Independence with exercise equipment Exercise tolerated well No report of concerns or symptoms today Strength training completed today  Goals Unmet:  Not Applicable  Comments: checkout at 1545.   Dr. Carlyle Dolly is Medical Director for [facility] Cardiac Rehab

## 2022-05-03 ENCOUNTER — Encounter (HOSPITAL_COMMUNITY)
Admission: RE | Admit: 2022-05-03 | Discharge: 2022-05-03 | Disposition: A | Discharge status: Home / Self Care | Payer: PPO | Source: Ambulatory Visit | Attending: Family Medicine | Admitting: Family Medicine

## 2022-05-03 DIAGNOSIS — Z952 Presence of prosthetic heart valve: Secondary | ICD-10-CM

## 2022-05-03 NOTE — Progress Notes (Signed)
Daily Session Note  Patient Details  Name: Jessica Ramsey MRN: [id number] Date of Birth: Oct 10, 1956 Referring Provider:   Flowsheet Row CARDIAC REHAB PHASE II ORIENTATION from 04/16/2022 in [place]  Referring Provider Dr. Cyndia Bent       Encounter Date: 05/03/2022  Check In:  Session Check In - 05/03/22 1436       Check-In   Supervising physician immediately available to respond to emergencies [facility] MD immediately available    Physician(s) Dr. Domenic Polite    Location [facility]    Staff Present Redge Gainer, BS, Exercise Physiologist;Safina Huard Wynetta Emery, RN, Bjorn Loser, MS, ACSM-CEP, Exercise Physiologist    Virtual Visit No    Medication changes reported     No    Fall or balance concerns reported    No    Tobacco Cessation No Change    Warm-up and Cool-down Performed as group-led instruction    Resistance Training Performed Yes    VAD Patient? No    PAD/SET Patient? No      Pain Assessment   Currently in Pain? No/denies    Pain Score 0-No pain    Multiple Pain Sites No             Capillary Blood Glucose: No results found for this or any previous visit (from the past 24 hour(s)).    Social History   Tobacco Use  Smoking Status Former   Packs/day: 0.25   Years: 40.00   Total pack years: 10.00   Types: Cigarettes   Quit date: 09/25/2017   Years since quitting: 4.6  Smokeless Tobacco Never  Tobacco Comments   5 cigarettes per day    Goals Met:  Independence with exercise equipment Exercise tolerated well No report of concerns or symptoms today Strength training completed today  Goals Unmet:  Not Applicable  Comments: Check out 1545.   Dr. Carlyle Dolly is Medical Director for [facility]

## 2022-05-08 ENCOUNTER — Encounter (HOSPITAL_COMMUNITY): Payer: PPO

## 2022-05-10 ENCOUNTER — Encounter (HOSPITAL_BASED_OUTPATIENT_CLINIC_OR_DEPARTMENT_OTHER): Payer: Self-pay | Admitting: Obstetrics & Gynecology

## 2022-05-10 ENCOUNTER — Encounter (HOSPITAL_COMMUNITY)
Admission: RE | Admit: 2022-05-10 | Discharge: 2022-05-10 | Disposition: A | Discharge status: Home / Self Care | Payer: PPO | Source: Ambulatory Visit | Attending: Family Medicine | Admitting: Family Medicine

## 2022-05-10 ENCOUNTER — Ambulatory Visit (INDEPENDENT_AMBULATORY_CARE_PROVIDER_SITE_OTHER): Payer: PPO | Admitting: Obstetrics & Gynecology

## 2022-05-10 ENCOUNTER — Other Ambulatory Visit (HOSPITAL_COMMUNITY)
Admission: RE | Admit: 2022-05-10 | Discharge: 2022-05-10 | Disposition: A | Discharge status: Home / Self Care | Payer: PPO | Source: Ambulatory Visit | Attending: Obstetrics & Gynecology | Admitting: Obstetrics & Gynecology

## 2022-05-10 VITALS — BP 132/78 | HR 62 | Ht 64.0 in | Wt 111.2 lb

## 2022-05-10 DIAGNOSIS — Z952 Presence of prosthetic heart valve: Secondary | ICD-10-CM | POA: Diagnosis not present

## 2022-05-10 DIAGNOSIS — B977 Papillomavirus as the cause of diseases classified elsewhere: Secondary | ICD-10-CM | POA: Insufficient documentation

## 2022-05-10 DIAGNOSIS — R87613 High grade squamous intraepithelial lesion on cytologic smear of cervix (HGSIL): Secondary | ICD-10-CM | POA: Diagnosis not present

## 2022-05-10 DIAGNOSIS — N870 Mild cervical dysplasia: Secondary | ICD-10-CM | POA: Diagnosis not present

## 2022-05-10 NOTE — Progress Notes (Signed)
Daily Session Note  Patient Details  Name: Jessica Ramsey MRN: [id number] Date of Birth: [birth year] Referring Provider:   Flowsheet Row CARDIAC REHAB PHASE II ORIENTATION from 04/16/2022 in [place]  Referring Provider Dr. Cyndia Bent       Encounter Date: 05/10/2022  Check In:  Session Check In - 05/10/22 1437       Check-In   Supervising physician immediately available to respond to emergencies [facility] MD immediately available    Physician(s) Dr Harrington Challenger    Location [facility]    Staff Present Redge Gainer, BS, Exercise Physiologist;Debra Wynetta Emery, RN, Bjorn Loser, MS, ACSM-CEP, Exercise Physiologist    Virtual Visit No    Medication changes reported     No    Fall or balance concerns reported    No    Tobacco Cessation No Change    Warm-up and Cool-down Performed as group-led instruction    Resistance Training Performed Yes    VAD Patient? No    PAD/SET Patient? No      Pain Assessment   Currently in Pain? No/denies    Pain Score 0-No pain    Multiple Pain Sites No             Capillary Blood Glucose: No results found for this or any previous visit (from the past 24 hour(s)).    Social History   Tobacco Use  Smoking Status Former   Packs/day: 0.25   Years: 40.00   Total pack years: 10.00   Types: Cigarettes   Quit date: 09/25/2017   Years since quitting: 4.6  Smokeless Tobacco Never  Tobacco Comments   5 cigarettes per day    Goals Met:  Independence with exercise equipment Exercise tolerated well No report of concerns or symptoms today Strength training completed today  Goals Unmet:  Not Applicable  Comments: Checkout at 1545.   Dr. Carlyle Dolly is Medical Director for [facility]

## 2022-05-13 ENCOUNTER — Other Ambulatory Visit (HOSPITAL_COMMUNITY): Payer: Self-pay

## 2022-05-13 LAB — SURGICAL PATHOLOGY

## 2022-05-13 NOTE — Progress Notes (Signed)
66 y.o. G22P1001 Divorced Not Hispanic or Latino female here for colposcopy with possible biopsies and/or ECC due to HGSIL Pap obtained 04/12/2022 with +HR HPV.  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  Colposcopy was negative and ECC negative.  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  Follow up Pap was 09/2021 and was negative with +HR HPV.    Patient's last menstrual period was 11/25/2001 (exact date).          Sexually active: No.  The current method of family planning is post menopausal status.     Patient has been counseled about results and procedure.  Risks and benefits have bene reviewed including immediate and/or delayed bleeding, infection, cervical scaring from procedure, possibility of needing additional follow up as well as treatment.  Rare risks of missing a lesion discussed as well.  All questions answered.  Pt ready to proceed.  Consent obtained.  BP 132/78 (BP Location: Right Arm, Patient Position: Sitting, Cuff Size: Normal)   Pulse 62   Ht '5\' 4"'$  (1.626 m) Comment: reported  Wt 111 lb 3.2 oz (50.4 kg)   LMP 11/25/2001 (Exact Date)   BMI 19.09 kg/m   General appearance: alert, cooperative and appears stated age Lymph nodes: No abnormal inguinal nodes palpated Neurologic: Grossly normal  Pelvic: External genitalia:  no lesions              Urethra:  normal appearing urethra with no masses, tenderness or lesions              Bartholins and Skenes: normal                 Vagina: normal appearing vagina with normal color and no discharge, no lesions               Physical Exam Constitutional:      Appearance: Normal appearance.  Genitourinary:    General: Normal vulva.     Comments: Atrophic vaginal tissue.  Cervix flush with apex. Neurological:     General: No focal deficit present.     Mental Status: She is alert.  Psychiatric:        Mood and Affect: Mood normal.     Speculum placed.  3% acetic acid applied to cervix for >45 seconds.   Cervix visualized with both 7.5X and 15X magnification.  Green filter also used.  Lugols solution was used.  Findings:  no AWE and no abnormal staining.  Biopsy:  not obtained.  ECC:  was performed.  Monsel's was not needed.  Excellent hemostasis was present.  Pt tolerated procedure well and all instruments were removed.    Chaperone, Octaviano Batty, CMA, was present during procedure.  Assessment/Plan: 1. HGSIL (high grade squamous intraepithelial lesion) on Pap smear of cervix - Surgical pathology( [facility]/ [facility]) - Pathology results will be called to patient and follow-up planned pending results.  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

## 2022-05-15 ENCOUNTER — Encounter (HOSPITAL_COMMUNITY)
Admission: RE | Admit: 2022-05-15 | Discharge: 2022-05-15 | Disposition: A | Discharge status: Home / Self Care | Payer: PPO | Source: Ambulatory Visit | Attending: Family Medicine | Admitting: Family Medicine

## 2022-05-15 DIAGNOSIS — Z952 Presence of prosthetic heart valve: Secondary | ICD-10-CM | POA: Diagnosis not present

## 2022-05-15 NOTE — Progress Notes (Signed)
Daily Session Note  Patient Details  Name: Jessica Ramsey MRN: [id number] Date of Birth: 1956/07/23 Referring Provider:   Flowsheet Row CARDIAC REHAB PHASE II ORIENTATION from 04/16/2022 in [place]  Referring Provider Dr. Cyndia Bent       Encounter Date: 05/15/2022  Check In:  Session Check In - 05/15/22 1432       Check-In   Supervising physician immediately available to respond to emergencies [facility] MD immediately available    Physician(s) Dr Gasper Sells    Location [facility]    Staff Present Redge Gainer, BS, Exercise Physiologist;Dalton Kris Mouton, MS, ACSM-CEP, Exercise Physiologist;Suriya Kovarik Hassell Done, RN, BSN    Virtual Visit No    Medication changes reported     No    Fall or balance concerns reported    No    Tobacco Cessation No Change    Warm-up and Cool-down Performed as group-led instruction    Resistance Training Performed Yes    VAD Patient? No    PAD/SET Patient? No      Pain Assessment   Currently in Pain? No/denies    Pain Score 0-No pain    Multiple Pain Sites No             Capillary Blood Glucose: No results found for this or any previous visit (from the past 24 hour(s)).    Social History   Tobacco Use  Smoking Status Former   Packs/day: 0.25   Years: 40.00   Total pack years: 10.00   Types: Cigarettes   Quit date: 09/25/2017   Years since quitting: 4.6  Smokeless Tobacco Never  Tobacco Comments   5 cigarettes per day    Goals Met:  Independence with exercise equipment Exercise tolerated well No report of concerns or symptoms today Strength training completed today  Goals Unmet:  Not Applicable  Comments: Checkout at 1545.   Dr. Carlyle Dolly is Medical Director for [facility]

## 2022-05-15 NOTE — Progress Notes (Signed)
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 compliant with medications prescribed. Participant is following exercise prescription and nutrition guidelines.;Long Term: Cholesterol controlled with medications as prescribed, with individualized exercise RX and with personalized nutrition plan. Value goals: LDL < '70mg'$ , HDL > 40 mg.    Personal Goal Other Yes    Personal Goal Patient wants to improve her strength and energy.    Intervention Patient will participate in the CR program with exercise and education and supplement with exercise at home.    Expected Outcomes Patient will complete the program meeting both program and personal goals.  Core  Components/Risk Factors/Patient Goals Review:   Goals and Risk Factor Review     Row Name 05/06/22 1243             Core Components/Risk Factors/Patient Goals Review   Personal Goals Review Weight Management/Obesity;Lipids;Other       Review Patient was referred to CR with AV replacement. She has multiple risk factors for CAD and is participating in the program for risk modification. She has completed 4 sessions. Her current weight is 112 lbs down 1 lb from her initial visit weight. She is doing well in the program with consistent attendance and progression. Her blood pressure is well controlled. Her personal goals for the program are to increase her strength and stamina. We will continue to monitor her progress as she works towards meeting these goals.       Expected Outcomes Patient will complete the program meeting both personal and program goals.                Core Components/Risk Factors/Patient Goals at Discharge (Final Review):   Goals and Risk Factor Review - 05/06/22 1243       Core Components/Risk Factors/Patient Goals Review   Personal Goals Review Weight Management/Obesity;Lipids;Other    Review Patient was referred to CR with AV replacement. She has multiple risk factors for CAD and is participating in the program for risk modification. She has completed 4 sessions. Her current weight is 112 lbs down 1 lb from her initial visit weight. She is doing well in the program with consistent attendance and progression. Her blood pressure is well controlled. Her personal goals for the program are to increase her strength and stamina. We will continue to monitor her progress as she works towards meeting these goals.    Expected Outcomes Patient will complete the program meeting both personal and program goals.             ITP Comments:   Comments: ITP REVIEW Pt is making expected progress toward Cardiac Rehab goals after completing 5 sessions. Recommend continued exercise,  life style modification, education, and increased stamina and strength.

## 2022-05-17 ENCOUNTER — Encounter (HOSPITAL_COMMUNITY): Payer: PPO

## 2022-05-20 ENCOUNTER — Other Ambulatory Visit (HOSPITAL_COMMUNITY): Payer: Self-pay

## 2022-05-20 MED ORDER — OXYCODONE-ACETAMINOPHEN 10-325 MG PO TABS
ORAL_TABLET | ORAL | 0 refills | Status: DC
  Filled 2022-05-20: qty 15, 5d supply, fill #0

## 2022-05-20 MED ORDER — METHOCARBAMOL 500 MG PO TABS
ORAL_TABLET | ORAL | 1 refills | Status: DC
  Filled 2022-05-20: qty 180, 22d supply, fill #0
  Filled 2022-06-17: qty 180, 22d supply, fill #1

## 2022-05-20 MED ORDER — CYCLOSPORINE 0.05 % OP EMUL
OPHTHALMIC | 12 refills | Status: AC
  Filled 2022-05-20: qty 180, 90d supply, fill #0

## 2022-05-22 ENCOUNTER — Encounter (HOSPITAL_COMMUNITY): Payer: PPO

## 2022-05-23 ENCOUNTER — Other Ambulatory Visit (HOSPITAL_COMMUNITY): Payer: Self-pay

## 2022-05-24 ENCOUNTER — Encounter (HOSPITAL_COMMUNITY): Payer: PPO

## 2022-05-27 ENCOUNTER — Encounter (HOSPITAL_BASED_OUTPATIENT_CLINIC_OR_DEPARTMENT_OTHER): Payer: Self-pay | Admitting: Obstetrics & Gynecology

## 2022-05-29 ENCOUNTER — Other Ambulatory Visit (HOSPITAL_COMMUNITY): Payer: Self-pay

## 2022-05-29 ENCOUNTER — Encounter (HOSPITAL_COMMUNITY): Admission: RE | Admit: 2022-05-29 | Payer: PPO | Source: Ambulatory Visit

## 2022-05-31 ENCOUNTER — Encounter (HOSPITAL_COMMUNITY): Payer: PPO

## 2022-06-03 ENCOUNTER — Telehealth: Payer: Self-pay

## 2022-06-03 ENCOUNTER — Encounter (HOSPITAL_BASED_OUTPATIENT_CLINIC_OR_DEPARTMENT_OTHER): Payer: Self-pay

## 2022-06-03 NOTE — Telephone Encounter (Signed)
Called patient to discuss potential surgery dates, patient opted for 08/29 date.

## 2022-06-05 ENCOUNTER — Encounter (HOSPITAL_COMMUNITY): Payer: PPO

## 2022-06-05 NOTE — Progress Notes (Signed)
Discharge Progress Report  Patient Details  Name: Jessica Ramsey MRN: [id number] Date of Birth: 04/29/1956 Referring Provider:   Flowsheet Row CARDIAC REHAB PHASE II ORIENTATION from 04/16/2022 in [place]  Referring Provider Dr. Cyndia Bent        Number of Visits: 6  Reason for Discharge:  Early Exit:  Personal  Smoking History:  Social History   Tobacco Use  Smoking Status Former   Packs/day: 0.25   Years: 40.00   Total pack years: 10.00   Types: Cigarettes   Quit date: 09/25/2017   Years since quitting: 4.6  Smokeless Tobacco Never  Tobacco Comments   5 cigarettes per day    Diagnosis:  S/P TAVR (transcatheter aortic valve replacement)  ADL [facility]:   Initial Exercise Prescription:  Initial Exercise Prescription - 04/16/22 1400       Date of Initial Exercise RX and Referring Provider   Date 04/16/22    Referring Provider Dr. Cyndia Bent    Expected Discharge Date 07/10/22      Treadmill   MPH 1.6    Grade 0    Minutes 17      Recumbant Elliptical   Level 1    RPM 60    Minutes 22      Prescription Details   Frequency (times per week) 2    Duration Progress to 30 minutes of continuous aerobic without signs/symptoms of physical distress      Intensity   THRR 40-80% of Max Heartrate 62-125    Ratings of Perceived Exertion 11-13    Perceived Dyspnea 0-4      Resistance Training   Training Prescription Yes    Weight 2    Reps 10-15             Discharge Exercise Prescription (Final Exercise Prescription Changes):  Exercise Prescription Changes - 05/13/22 1500       Response to Exercise   Blood Pressure (Admit) 110/58    Blood Pressure (Exercise) 112/60    Blood Pressure (Exit) 110/60    Heart Rate (Admit) 68 bpm    Heart Rate (Exercise) 84 bpm    Heart Rate (Exit) 81 bpm    Rating of Perceived Exertion (Exercise) 13    Duration Continue with 30 min of aerobic exercise without signs/symptoms of physical distress.     Intensity THRR unchanged      Progression   Progression Continue to progress workloads to maintain intensity without signs/symptoms of physical distress.      Resistance Training   Training Prescription Yes    Weight 2    Reps 10-15    Time 10 Minutes      Treadmill   MPH 1.7    Grade 0    Minutes 17    METs 2.3      Recumbant Elliptical   Level 1    RPM 43    Minutes 22    METs 3.9             Functional Capacity:  6 Minute Walk     Row Name 04/16/22 1421         6 Minute Walk   Phase Initial     Distance 1200 feet     Walk Time 6 minutes     # of Rest Breaks 0     MPH 2.27     METS 2.68     RPE 11     VO2 Peak 9.38  Symptoms No     Resting HR 51 bpm     Resting BP 102/68     Resting Oxygen Saturation  98 %     Exercise Oxygen Saturation  during 6 min walk 97 %     Max Ex. HR 62 bpm     Max Ex. BP 110/66     2 Minute Post BP 100/60              Psychological, QOL, Others - Outcomes: PHQ 2/9:    05/10/2022   11:52 AM 04/16/2022    1:47 PM 04/12/2022   10:14 AM 12/03/2021    4:17 PM 10/12/2021   10:33 AM  Depression screen PHQ 2/9  Decreased Interest 0 0 0 0 0  Down, Depressed, Hopeless 0 1 0 0 0  PHQ - 2 Score 0 1 0 0 0  Altered sleeping  0     Tired, decreased energy  1     Change in appetite  0     Feeling bad or failure about yourself   0     Trouble concentrating  0     Moving slowly or fidgety/restless  0     Suicidal thoughts  0     PHQ-9 Score  2     Difficult doing work/chores  Somewhat difficult       Quality of Life:  Quality of Life - 04/16/22 1425       Quality of Life   Select Quality of Life      Quality of Life Scores   Health/Function Pre 22.7 %    Socioeconomic Pre 20.86 %    Psych/Spiritual Pre 27.5 %    Family Pre 26.38 %    GLOBAL Pre 23.66 %             Personal Goals: Goals established at orientation with interventions provided to work toward goal.  Personal Goals and Risk Factors at  Admission - 04/16/22 1356       Core Components/Risk Factors/Patient Goals on Admission    Weight Management Weight Maintenance    Lipids Yes    Intervention Provide education and support for participant on nutrition & aerobic/resistive exercise along with prescribed medications to achieve LDL '70mg'$ , HDL >'40mg'$ .    Expected Outcomes Short Term: Participant states understanding of desired cholesterol values and is compliant with medications prescribed. Participant is following exercise prescription and nutrition guidelines.;Long Term: Cholesterol controlled with medications as prescribed, with individualized exercise RX and with personalized nutrition plan. Value goals: LDL < '70mg'$ , HDL > 40 mg.    Personal Goal Other Yes    Personal Goal Patient wants to improve her strength and energy.    Intervention Patient will participate in the CR program with exercise and education and supplement with exercise at home.    Expected Outcomes Patient will complete the program meeting both program and personal goals.              Personal Goals Discharge:  Goals and Risk Factor Review     Row Name 05/06/22 1243             Core Components/Risk Factors/Patient Goals Review   Personal Goals Review Weight Management/Obesity;Lipids;Other       Review Patient was referred to CR with AV replacement. She has multiple risk factors for CAD and is participating in the program for risk modification. She has completed 4 sessions. Her current weight is 112 lbs down 1 lb from her  initial visit weight. She is doing well in the program with consistent attendance and progression. Her blood pressure is well controlled. Her personal goals for the program are to increase her strength and stamina. We will continue to monitor her progress as she works towards meeting these goals.       Expected Outcomes Patient will complete the program meeting both personal and program goals.                Exercise Goals and  Review:  Exercise Goals     Row Name 04/16/22 1423 05/13/22 1505           Exercise Goals   Increase Physical Activity Yes Yes      Intervention Provide advice, education, support and counseling about physical activity/exercise needs.;Develop an individualized exercise prescription for aerobic and resistive training based on initial evaluation findings, risk stratification, comorbidities and participant's personal goals. Provide advice, education, support and counseling about physical activity/exercise needs.;Develop an individualized exercise prescription for aerobic and resistive training based on initial evaluation findings, risk stratification, comorbidities and participant's personal goals.      Expected Outcomes Long Term: Exercising regularly at least 3-5 days a week.;Long Term: Add in home exercise to make exercise part of routine and to increase amount of physical activity.;Short Term: Attend rehab on a regular basis to increase amount of physical activity. Long Term: Exercising regularly at least 3-5 days a week.;Long Term: Add in home exercise to make exercise part of routine and to increase amount of physical activity.;Short Term: Attend rehab on a regular basis to increase amount of physical activity.      Increase Strength and Stamina Yes Yes      Intervention Provide advice, education, support and counseling about physical activity/exercise needs.;Develop an individualized exercise prescription for aerobic and resistive training based on initial evaluation findings, risk stratification, comorbidities and participant's personal goals. Provide advice, education, support and counseling about physical activity/exercise needs.;Develop an individualized exercise prescription for aerobic and resistive training based on initial evaluation findings, risk stratification, comorbidities and participant's personal goals.      Expected Outcomes Short Term: Increase workloads from initial exercise  prescription for resistance, speed, and METs.;Short Term: Perform resistance training exercises routinely during rehab and add in resistance training at home;Long Term: Improve cardiorespiratory fitness, muscular endurance and strength as measured by increased METs and functional capacity (6MWT) Short Term: Increase workloads from initial exercise prescription for resistance, speed, and METs.;Short Term: Perform resistance training exercises routinely during rehab and add in resistance training at home;Long Term: Improve cardiorespiratory fitness, muscular endurance and strength as measured by increased METs and functional capacity (6MWT)      Able to understand and use rate of perceived exertion (RPE) scale Yes Yes      Intervention Provide education and explanation on how to use RPE scale Provide education and explanation on how to use RPE scale      Expected Outcomes Short Term: Able to use RPE daily in rehab to express subjective intensity level;Long Term:  Able to use RPE to guide intensity level when exercising independently Short Term: Able to use RPE daily in rehab to express subjective intensity level;Long Term:  Able to use RPE to guide intensity level when exercising independently      Knowledge and understanding of Target Heart Rate Range (THRR) Yes Yes      Intervention Provide education and explanation of THRR including how the numbers were predicted and where they are located for reference Provide  education and explanation of THRR including how the numbers were predicted and where they are located for reference      Expected Outcomes Short Term: Able to state/look up THRR;Long Term: Able to use THRR to govern intensity when exercising independently;Short Term: Able to use daily as guideline for intensity in rehab Short Term: Able to state/look up THRR;Long Term: Able to use THRR to govern intensity when exercising independently;Short Term: Able to use daily as guideline for intensity in rehab       Able to check pulse independently Yes Yes      Intervention Provide education and demonstration on how to check pulse in carotid and radial arteries.;Review the importance of being able to check your own pulse for safety during independent exercise --      Expected Outcomes Short Term: Able to explain why pulse checking is important during independent exercise;Long Term: Able to check pulse independently and accurately Short Term: Able to explain why pulse checking is important during independent exercise;Long Term: Able to check pulse independently and accurately      Understanding of Exercise Prescription Yes Yes      Intervention Provide education, explanation, and written materials on patient's individual exercise prescription Provide education, explanation, and written materials on patient's individual exercise prescription      Expected Outcomes Short Term: Able to explain program exercise prescription;Long Term: Able to explain home exercise prescription to exercise independently Short Term: Able to explain program exercise prescription;Long Term: Able to explain home exercise prescription to exercise independently               Exercise Goals Re-Evaluation:  Exercise Goals Re-Evaluation     Row Name 05/13/22 1505             Exercise Goal Re-Evaluation   Exercise Goals Review Increase Physical Activity;Increase Strength and Stamina;Able to understand and use rate of perceived exertion (RPE) scale;Knowledge and understanding of Target Heart Rate Range (THRR);Able to check pulse independently;Understanding of Exercise Prescription       Comments Pt has completed 5 sessions of cardiac rehab. She is motivated when she comes to class and is enjoying being here. She is increasing her workload slowly due to being nervious of becoming fatigued. She is currently exercising at 3.9 METs on the ellp. Will continue to monitor and progress as able.       Expected Outcomes Through exercise at  rehab and at home, the patient will meet their stated goals.                Nutrition & Weight - Outcomes:  Pre Biometrics - 04/16/22 1424       Pre Biometrics   Height 4' 11.5" (1.511 m)    Weight 111 lb 1.8 oz (50.4 kg)    Waist Circumference 30 inches    Hip Circumference 35 inches    Waist to Hip Ratio 0.86 %    BMI (Calculated) 22.08    Triceps Skinfold 15 mm    % Body Fat 31.4 %    Grip Strength 20.4 kg    Flexibility 10 in    Single Leg Stand 20 seconds              Nutrition:  Nutrition Therapy & Goals - 04/16/22 1348       Personal Nutrition Goals   Comments Patient scored 36 on her diet assessment. Handout provided and explained regarding healthier choices. Patient verbalized understanding. We offer 2 educational sessions on heart healthy  nutrition with handouts and assistance with RD referral if patient is interested.      Intervention Plan   Intervention Nutrition handout(s) given to patient.             Nutrition Discharge:  Nutrition Assessments - 04/16/22 1348       MEDFICTS Scores   Pre Score 36             Education Questionnaire Score:  Knowledge Questionnaire Score - 04/16/22 1350       Knowledge Questionnaire Score   Pre Score 24/28             Pt discharged from CR after 6 sessions. She last attended on 05/15/2022. She called the department on 06/05/2022 and stated that she would be unable to continue due to back pain, and because she has a hysterectomy scheduled the end of August.

## 2022-06-05 NOTE — Addendum Note (Signed)
Encounter addended by: Philis Kendall on: 06/05/2022 11:47 AM  Actions taken: Flowsheet accepted, Clinical Note Signed

## 2022-06-07 ENCOUNTER — Encounter (HOSPITAL_COMMUNITY): Payer: PPO

## 2022-06-12 ENCOUNTER — Encounter (HOSPITAL_COMMUNITY): Payer: PPO

## 2022-06-13 ENCOUNTER — Emergency Department (HOSPITAL_COMMUNITY)
Admission: EM | Admit: 2022-06-13 | Discharge: 2022-06-14 | Disposition: A | Discharge status: Home / Self Care | Payer: PPO | Attending: Emergency Medicine | Admitting: Emergency Medicine

## 2022-06-13 ENCOUNTER — Emergency Department (HOSPITAL_COMMUNITY): Payer: PPO

## 2022-06-13 ENCOUNTER — Other Ambulatory Visit: Payer: Self-pay

## 2022-06-13 ENCOUNTER — Encounter (HOSPITAL_COMMUNITY): Payer: Self-pay

## 2022-06-13 DIAGNOSIS — Y92007 Garden or yard of unspecified non-institutional (private) residence as the place of occurrence of the external cause: Secondary | ICD-10-CM | POA: Insufficient documentation

## 2022-06-13 DIAGNOSIS — Z79899 Other long term (current) drug therapy: Secondary | ICD-10-CM | POA: Insufficient documentation

## 2022-06-13 DIAGNOSIS — R55 Syncope and collapse: Secondary | ICD-10-CM | POA: Insufficient documentation

## 2022-06-13 DIAGNOSIS — Z7982 Long term (current) use of aspirin: Secondary | ICD-10-CM | POA: Insufficient documentation

## 2022-06-13 LAB — CBC WITH DIFFERENTIAL/PLATELET
Abs Immature Granulocytes: 0.04 10*3/uL (ref 0.00–0.07)
Basophils Absolute: 0.1 10*3/uL (ref 0.0–0.1)
Basophils Relative: 1 %
Eosinophils Absolute: 0.2 10*3/uL (ref 0.0–0.5)
Eosinophils Relative: 2 %
HCT: 36.4 % (ref 36.0–46.0)
Hemoglobin: 12.4 g/dL (ref 12.0–15.0)
Immature Granulocytes: 0 %
Lymphocytes Relative: 16 %
Lymphs Abs: 1.7 10*3/uL (ref 0.7–4.0)
MCH: 33.2 pg (ref 26.0–34.0)
MCHC: 34.1 g/dL (ref 30.0–36.0)
MCV: 97.3 fL (ref 80.0–100.0)
Monocytes Absolute: 0.7 10*3/uL (ref 0.1–1.0)
Monocytes Relative: 7 %
Neutro Abs: 7.9 10*3/uL — ABNORMAL HIGH (ref 1.7–7.7)
Neutrophils Relative %: 74 %
Platelets: 213 10*3/uL (ref 150–400)
RBC: 3.74 MIL/uL — ABNORMAL LOW (ref 3.87–5.11)
RDW: 12.9 % (ref 11.5–15.5)
WBC: 10.6 10*3/uL — ABNORMAL HIGH (ref 4.0–10.5)
nRBC: 0 % (ref 0.0–0.2)

## 2022-06-13 LAB — COMPREHENSIVE METABOLIC PANEL
ALT: 34 U/L (ref 0–44)
AST: 43 U/L — ABNORMAL HIGH (ref 15–41)
Albumin: 3.9 g/dL (ref 3.5–5.0)
Alkaline Phosphatase: 43 U/L (ref 38–126)
Anion gap: 9 (ref 5–15)
BUN: 16 mg/dL (ref 8–23)
CO2: 24 mmol/L (ref 22–32)
Calcium: 8.8 mg/dL — ABNORMAL LOW (ref 8.9–10.3)
Chloride: 104 mmol/L (ref 98–111)
Creatinine, Ser: 1 mg/dL (ref 0.44–1.00)
GFR, Estimated: >60 mL/min (ref >60)
Glucose, Bld: 133 mg/dL — ABNORMAL HIGH (ref 70–99)
Potassium: 4.2 mmol/L (ref 3.5–5.1)
Sodium: 137 mmol/L (ref 135–145)
Total Bilirubin: 0.8 mg/dL (ref 0.3–1.2)
Total Protein: 7.1 g/dL (ref 6.5–8.1)

## 2022-06-13 LAB — RAPID URINE DRUG SCREEN, HOSP PERFORMED
Amphetamines: NOT DETECTED
Barbiturates: NOT DETECTED
Benzodiazepines: NOT DETECTED
Cocaine: NOT DETECTED
Opiates: NOT DETECTED
Tetrahydrocannabinol: NOT DETECTED

## 2022-06-13 LAB — I-STAT CHEM 8, ED
BUN: 18 mg/dL (ref 8–23)
Calcium, Ion: 1.14 mmol/L — ABNORMAL LOW (ref 1.15–1.40)
Chloride: 103 mmol/L (ref 98–111)
Creatinine, Ser: 1.1 mg/dL — ABNORMAL HIGH (ref 0.44–1.00)
Glucose, Bld: 105 mg/dL — ABNORMAL HIGH (ref 70–99)
HCT: 36 % (ref 36.0–46.0)
Hemoglobin: 12.2 g/dL (ref 12.0–15.0)
Potassium: 4.2 mmol/L (ref 3.5–5.1)
Sodium: 136 mmol/L (ref 135–145)
TCO2: 25 mmol/L (ref 22–32)

## 2022-06-13 LAB — TROPONIN I (HIGH SENSITIVITY): Troponin I (High Sensitivity): 8 ng/L (ref <18)

## 2022-06-13 LAB — URINALYSIS, ROUTINE W REFLEX MICROSCOPIC
Bilirubin Urine: NEGATIVE
Glucose, UA: NEGATIVE mg/dL
Hgb urine dipstick: NEGATIVE
Ketones, ur: 5 mg/dL — AB
Nitrite: NEGATIVE
Protein, ur: NEGATIVE mg/dL
Specific Gravity, Urine: 1.008 (ref 1.005–1.030)
pH: 6 (ref 5.0–8.0)

## 2022-06-13 LAB — ETHANOL: Alcohol, Ethyl (B): <10 mg/dL (ref <10)

## 2022-06-13 MED ORDER — SODIUM CHLORIDE 0.9 % IV BOLUS
500.0000 mL | Freq: Once | INTRAVENOUS | Status: AC
  Administered 2022-06-13: 500 mL via INTRAVENOUS
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
## 2022-06-14 ENCOUNTER — Encounter (HOSPITAL_COMMUNITY): Payer: PPO

## 2022-06-14 ENCOUNTER — Emergency Department (HOSPITAL_COMMUNITY): Payer: PPO

## 2022-06-14 LAB — TROPONIN I (HIGH SENSITIVITY): Troponin I (High Sensitivity): 8 ng/L (ref <18)

## 2022-06-14 NOTE — ED Notes (Signed)
Pt ambulated to the bathroom without difficulty or complaints.
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
## 2022-06-14 NOTE — ED Notes (Signed)
Patient transported to CT 

## 2022-06-14 NOTE — ED Notes (Addendum)
Pt ambulated in hall independently, pt stated she "feels okay but weak".

## 2022-06-17 ENCOUNTER — Other Ambulatory Visit (HOSPITAL_COMMUNITY): Payer: Self-pay

## 2022-06-19 ENCOUNTER — Encounter (HOSPITAL_COMMUNITY): Payer: PPO
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
## 2022-06-21 ENCOUNTER — Ambulatory Visit: Payer: PPO | Admitting: Nurse Practitioner

## 2022-06-21 ENCOUNTER — Encounter (HOSPITAL_COMMUNITY): Payer: PPO
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
## 2022-06-25 NOTE — Progress Notes (Unsigned)
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    Follow-Up: At [facility], you and your health needs are our priority.  As part of our continuing mission to provide you with exceptional heart care, we have created designated Provider Care Teams.  These Care Teams include your primary Cardiologist (physician) and Advanced Practice Providers (APPs -  Physician Assistants and Nurse Practitioners) who all work together to provide you with the care you need, when you need it.  We recommend signing up for the patient portal called "MyChart".  Sign up information is provided on this After Visit Summary.  MyChart is used to connect with patients for Virtual Visits (Telemedicine).  Patients are able to view lab/test results, encounter notes, upcoming appointments, etc.  Non-urgent messages can be sent to your provider as well.   To learn more about what you can do with MyChart, go to [email or web address].     Your next appointment:   As scheduled  The format for your next appointment:   In Person  Provider:   [name], NP         Other Instructions   Important Information About Sugar         Signed, Emmaline Life, NP  06/27/2022 3:03 PM    [place]

## 2022-06-26 ENCOUNTER — Encounter (HOSPITAL_COMMUNITY): Payer: PPO

## 2022-06-26 ENCOUNTER — Ambulatory Visit: Payer: PPO | Admitting: Nurse Practitioner

## 2022-06-27 ENCOUNTER — Ambulatory Visit (INDEPENDENT_AMBULATORY_CARE_PROVIDER_SITE_OTHER): Payer: Medicare Other | Admitting: Nurse Practitioner

## 2022-06-27 ENCOUNTER — Encounter: Payer: Self-pay | Admitting: Nurse Practitioner

## 2022-06-27 ENCOUNTER — Ambulatory Visit (INDEPENDENT_AMBULATORY_CARE_PROVIDER_SITE_OTHER): Payer: Medicare Other

## 2022-06-27 VITALS — BP 138/82 | HR 78 | Ht 59.5 in | Wt 110.8 lb

## 2022-06-27 DIAGNOSIS — I10 Essential (primary) hypertension: Secondary | ICD-10-CM

## 2022-06-27 DIAGNOSIS — I350 Nonrheumatic aortic (valve) stenosis: Secondary | ICD-10-CM | POA: Diagnosis not present

## 2022-06-27 DIAGNOSIS — I5032 Chronic diastolic (congestive) heart failure: Secondary | ICD-10-CM

## 2022-06-27 DIAGNOSIS — Z952 Presence of prosthetic heart valve: Secondary | ICD-10-CM

## 2022-06-27 DIAGNOSIS — R55 Syncope and collapse: Secondary | ICD-10-CM | POA: Diagnosis not present

## 2022-06-27 DIAGNOSIS — G8929 Other chronic pain: Secondary | ICD-10-CM

## 2022-06-27 DIAGNOSIS — F17200 Nicotine dependence, unspecified, uncomplicated: Secondary | ICD-10-CM

## 2022-06-27 NOTE — Patient Instructions (Signed)
Medication Instructions:  Your physician recommends that you continue on your current medications as directed. Please refer to the Current Medication list given to you today.  *If you need a refill on your cardiac medications before your next appointment, please call your pharmacy*   Lab Work: None ordered  If you have labs (blood work) drawn today and your tests are completely normal, you will receive your results only by: [place] (if you have MyChart) OR A paper copy in the mail If you have any lab test that is abnormal or we need to change your treatment, we will call you to review the results.   Testing/Procedures: [place]- Long Term Monitor Instructions  Your physician has requested you wear a ZIO patch monitor for 14 days.  This is a single patch monitor. [employer] supplies one patch monitor per enrollment. Additional stickers are not available. Please do not apply patch if you will be having a Nuclear Stress Test,  Echocardiogram, Cardiac CT, MRI, or Chest Xray during the period you would be wearing the  monitor. The patch cannot be worn during these tests. You cannot remove and re-apply the  ZIO XT patch monitor.  Your ZIO patch monitor will be mailed 3 day USPS to your address on file. It may take 3-5 days  to receive your monitor after you have been enrolled.  Once you have received your monitor, please review the enclosed instructions. Your monitor  has already been registered assigning a specific monitor serial # to you.  Billing and Patient Assistance Program Information  We have supplied [employer] with any of your insurance information on file for billing purposes. [employer] offers a sliding scale Patient Assistance Program for patients that do not have  insurance, or whose insurance does not completely cover the cost of the ZIO monitor.  You must apply for the Patient Assistance Program to qualify for this discounted rate.  To apply, please call [employer] at  [phone], select option 4, select option 2, ask to apply for  Patient Assistance Program. Theodore Demark will ask your household income, and how many people  are in your household. They will quote your out-of-pocket cost based on that information.  [employer] will also be able to set up a 48-month interest-free payment plan if needed.  Applying the monitor   Shave hair from upper left chest.  Hold abrader disc by orange tab. Rub abrader in 40 strokes over the upper left chest as  indicated in your monitor instructions.  Clean area with 4 enclosed alcohol pads. Let dry.  Apply patch as indicated in monitor instructions. Patch will be placed under collarbone on left  side of chest with arrow pointing upward.  Rub patch adhesive wings for 2 minutes. Remove white label marked "1". Remove the white  label marked "2". Rub patch adhesive wings for 2 additional minutes.  While looking in a mirror, press and release button in center of patch. A small green light will  flash 3-4 times. This will be your only indicator that the monitor has been turned on.  Do not shower for the first 24 hours. You may shower after the first 24 hours.  Press the button if you feel a symptom. You will hear a small click. Record Date, Time and  Symptom in the Patient Logbook.  When you are ready to remove the patch, follow instructions on the last 2 pages of Patient  Logbook. Stick patch monitor onto the last page of Patient Logbook.  Place Patient  Logbook in the blue and white box. Use locking tab on box and tape box closed  securely. The blue and white box has prepaid postage on it. Please place it in the mailbox as  soon as possible. Your physician should have your test results approximately 7 days after the  monitor has been mailed back to [facility].  Call [place] at [phone] if you have questions regarding  your ZIO XT patch monitor. Call them immediately if you see an orange light  blinking on your  monitor.  If your monitor falls off in less than 4 days, contact our Monitor department at [phone].  If your monitor becomes loose or falls off after 4 days call [employer] at [phone] for  suggestions on securing your monitor    Follow-Up: At [facility], you and your health needs are our priority.  As part of our continuing mission to provide you with exceptional heart care, we have created designated Provider Care Teams.  These Care Teams include your primary Cardiologist (physician) and Advanced Practice Providers (APPs -  Physician Assistants and Nurse Practitioners) who all work together to provide you with the care you need, when you need it.  We recommend signing up for the patient portal called "MyChart".  Sign up information is provided on this After Visit Summary.  MyChart is used to connect with patients for Virtual Visits (Telemedicine).  Patients are able to view lab/test results, encounter notes, upcoming appointments, etc.  Non-urgent messages can be sent to your provider as well.   To learn more about what you can do with MyChart, go to [email or web address].    Your next appointment:   As scheduled  The format for your next appointment:   In Person  Provider:   [name], NP         Other Instructions   Important Information About Sugar

## 2022-06-27 NOTE — Progress Notes (Unsigned)
Enrolled for [employer] to mail a ZIO XT long term holter monitor to the patients address on file.   Dr. Turner to read. 

## 2022-06-28 ENCOUNTER — Encounter (HOSPITAL_COMMUNITY): Payer: PPO

## 2022-07-02 DIAGNOSIS — R55 Syncope and collapse: Secondary | ICD-10-CM | POA: Diagnosis not present

## 2022-07-03 ENCOUNTER — Encounter (HOSPITAL_COMMUNITY): Payer: PPO

## 2022-07-05 ENCOUNTER — Encounter (HOSPITAL_COMMUNITY): Payer: PPO

## 2022-07-10 ENCOUNTER — Encounter (HOSPITAL_COMMUNITY): Payer: PPO

## 2022-07-14 ENCOUNTER — Encounter (HOSPITAL_BASED_OUTPATIENT_CLINIC_OR_DEPARTMENT_OTHER): Payer: Self-pay | Admitting: Obstetrics & Gynecology

## 2022-07-17 NOTE — Pre-Procedure Instructions (Signed)
Surgical Instructions    Your procedure is scheduled on Tuesday August 29.  Report to [facility] Main Entrance "A" at 5:30 A.M., then check in with the Admitting office.  Call this number if you have problems the morning of surgery:  [phone]   If you have any questions prior to your surgery date call [phone]: Open Monday-Friday 8am-4pm    Remember:  Do not eat after midnight the night before your surgery  You may drink clear liquids until 4:30 the morning of your surgery.   Clear liquids allowed are: Water, Non-Citrus Juices (without pulp), Carbonated Beverages, Clear Tea, Black Coffee ONLY (NO MILK, CREAM OR POWDERED CREAMER of any kind), and Gatorade     Take these medicines the morning of surgery with A SIP OF WATER:  acyclovir (ZOVIRAX) tablet cycloSPORINE (RESTASIS) DULoxetine (CYMBALTA) gabapentin (NEURONTIN) metoprolol tartrate (LOPRESSOR) montelukast (SINGULAIR)   IF NEEDED: acetaminophen (TYLENOL) EPINEPHrine- please call if this is taken the day of surgery fluticasone (FLONASE) methocarbamol (ROBAXIN) oxyCODONE-acetaminophen (PERCOCET)  Follow your surgeon's instructions on when to stop Aspirin.  If no instructions were given by your surgeon then you will need to call the office to get those instructions.    As of today, STOP taking any Aspirin (unless otherwise instructed by your surgeon) Aleve, Naproxen, Ibuprofen, Motrin, Advil, Goody's, BC's, all herbal medications, fish oil, and all vitamins. This includes meloxicam (MOBIC).            Do not wear jewelry or makeup. Do not wear lotions, powders, perfumes or deodorant. Do not shave 48 hours prior to surgery. Do not bring valuables to the hospital. Do not wear nail polish, gel polish, artificial nails, or any other type of covering on natural nails (fingers and toes) If you have artificial nails or gel coating that need to be removed by a nail salon, please have this removed prior to surgery.  Artificial nails or gel coating may interfere with anesthesia's ability to adequately monitor your vital signs.  [place] is not responsible for any belongings or valuables.    Do NOT Smoke (Tobacco/Vaping)  24 hours prior to your procedure  If you use a CPAP at night, you may bring your mask for your overnight stay.   Contacts, glasses, hearing aids, dentures or partials may not be worn into surgery, please bring cases for these belongings   For patients admitted to the hospital, discharge time will be determined by your treatment team.   Patients discharged the day of surgery will not be allowed to drive home, and someone needs to stay with them for 24 hours.   SURGICAL WAITING ROOM VISITATION Patients having surgery or a procedure may have no more than 2 support people in the waiting area - these visitors may rotate.   Children under the age of 80 must have an adult with them who is not the patient. If the patient needs to stay at the hospital during part of their recovery, the visitor guidelines for inpatient rooms apply. Pre-op nurse will coordinate an appropriate time for 1 support person to accompany patient in pre-op.  This support person may not rotate.   Please refer to the [facility] website for the visitor guidelines for Inpatients (after your surgery is over and you are in a regular room).    Special instructions:    Oral Hygiene is also important to reduce your risk of infection.  Remember - BRUSH YOUR TEETH THE MORNING OF SURGERY WITH YOUR REGULAR TOOTHPASTE   [facility]- Preparing For Surgery  Before surgery, you can play an important role. Because skin is not sterile, your skin needs to be as free of germs as possible. You can reduce the number of germs on your skin by washing with CHG (chlorahexidine gluconate) Soap before surgery.  CHG is an antiseptic cleaner which kills germs and bonds with the skin to continue killing germs even after washing.     Please do  not use if you have an allergy to CHG or antibacterial soaps. If your skin becomes reddened/irritated stop using the CHG.  Do not shave (including legs and underarms) for at least 48 hours prior to first CHG shower. It is OK to shave your face.  Please follow these instructions carefully.     Shower the NIGHT BEFORE SURGERY and the MORNING OF SURGERY with CHG Soap.   If you chose to wash your hair, wash your hair first as usual with your normal shampoo. After you shampoo, rinse your hair and body thoroughly to remove the shampoo.  Then [employer] and genitals (private parts) with your normal soap and rinse thoroughly to remove soap.  After that Use CHG Soap as you would any other liquid soap. You can apply CHG directly to the skin and wash gently with a scrungie or a clean washcloth.   Apply the CHG Soap to your body ONLY FROM THE NECK DOWN.  Do not use on open wounds or open sores. Avoid contact with your eyes, ears, mouth and genitals (private parts). Wash Face and genitals (private parts)  with your normal soap.   Wash thoroughly, paying special attention to the area where your surgery will be performed.  Thoroughly rinse your body with warm water from the neck down.  DO NOT shower/wash with your normal soap after using and rinsing off the CHG Soap.  Pat yourself dry with a CLEAN TOWEL.  Wear CLEAN PAJAMAS to bed the night before surgery  Place CLEAN SHEETS on your bed the night before your surgery  DO NOT SLEEP WITH PETS.   Day of Surgery:  Take a shower with CHG soap. Wear Clean/Comfortable clothing the morning of surgery Do not apply any deodorants/lotions.   Remember to brush your teeth WITH YOUR REGULAR TOOTHPASTE.    If you received a COVID test during your pre-op visit, it is requested that you wear a mask when out in public, stay away from anyone that may not be feeling well, and notify your surgeon if you develop symptoms. If you have been in contact with anyone that  has tested positive in the last 10 days, please notify your surgeon.    Please read over the following fact sheets that you were given.

## 2022-07-18 ENCOUNTER — Inpatient Hospital Stay (HOSPITAL_COMMUNITY)
Admission: RE | Admit: 2022-07-18 | Discharge: 2022-07-18 | Disposition: A | Discharge status: Home / Self Care | Payer: Medicare Other | Source: Ambulatory Visit

## 2022-07-18 NOTE — Progress Notes (Signed)
Called patient and made her aware that surgery will be rescheduled due to recent events that have required her to wear a heart monitor.

## 2022-07-23 ENCOUNTER — Encounter (HOSPITAL_COMMUNITY): Admission: RE | Payer: Self-pay | Source: Home / Self Care

## 2022-07-23 ENCOUNTER — Ambulatory Visit (HOSPITAL_COMMUNITY)
Admission: RE | Admit: 2022-07-23 | Payer: Medicare Other | Source: Home / Self Care | Admitting: Obstetrics & Gynecology

## 2022-07-23 SURGERY — HYSTERECTOMY, TOTAL, LAPAROSCOPIC, WITH SALPINGECTOMY
Anesthesia: Choice | Laterality: Bilateral

## 2022-07-31 ENCOUNTER — Encounter (HOSPITAL_BASED_OUTPATIENT_CLINIC_OR_DEPARTMENT_OTHER): Payer: PPO | Admitting: Obstetrics & Gynecology

## 2022-08-01 ENCOUNTER — Encounter (HOSPITAL_BASED_OUTPATIENT_CLINIC_OR_DEPARTMENT_OTHER): Payer: Self-pay | Admitting: Obstetrics & Gynecology

## 2022-08-08 ENCOUNTER — Encounter (HOSPITAL_BASED_OUTPATIENT_CLINIC_OR_DEPARTMENT_OTHER): Payer: Self-pay

## 2022-08-19 ENCOUNTER — Other Ambulatory Visit (HOSPITAL_COMMUNITY): Payer: Self-pay

## 2022-08-19 ENCOUNTER — Encounter (HOSPITAL_BASED_OUTPATIENT_CLINIC_OR_DEPARTMENT_OTHER): Payer: PPO | Admitting: Obstetrics & Gynecology

## 2022-08-19 MED ORDER — METOPROLOL TARTRATE 25 MG PO TABS: ORAL_TABLET | ORAL | 11 refills | Status: DC

## 2022-08-19 MED ORDER — METOPROLOL TARTRATE 25 MG PO TABS: ORAL_TABLET | ORAL | 3 refills | Status: DC

## 2022-08-19 MED ORDER — METOPROLOL TARTRATE 25 MG PO TABS
ORAL_TABLET | ORAL | 3 refills | Status: DC
  Filled 2022-08-19: qty 90, fill #0

## 2022-08-19 NOTE — Addendum Note (Signed)
Addended by: Antonieta Iba on: 08/19/2022 11:52 AM   Modules accepted: Orders

## 2022-08-23 IMAGING — CT CT ANGIO NECK
1 of 4 series · 2 of 16 positions shown · IV contrast (APPLIED)
Comparison: Report from brain MRI 11/15/2002 (images unavailable).
CT of the cervical spine 01/01/2011.

CLINICAL DATA: Near syncope. Syncope, recurrent. Additional history
provided by scanning technologist: Patient reports 2 episodes of
syncope in the last year.

Creatinine was obtained on site at [HOSPITAL] at [HOSPITAL].
Results: Creatinine 0.7 mg/dL (GFR 92).
EXAM:
CT ANGIOGRAPHY HEAD AND NECK
TECHNIQUE: Multidetector CT imaging of the head and neck was performed using
the standard protocol during bolus administration of intravenous
contrast. Multiplanar CT image reconstructions and MIPs were
obtained to evaluate the vascular anatomy. Carotid stenosis
measurements (when applicable) are obtained utilizing NASCET
criteria, using the distal internal carotid diameter as the
denominator.
CONTRAST:  75mL F0Z87J-GTX IOPAMIDOL (F0Z87J-GTX) INJECTION 76%

[Series 7: head/neck angio · axial · 0.46mm/px · z∈[-214,-96]mm · 2 of 178 slices shown]
[im 60/178  soft-tissue]
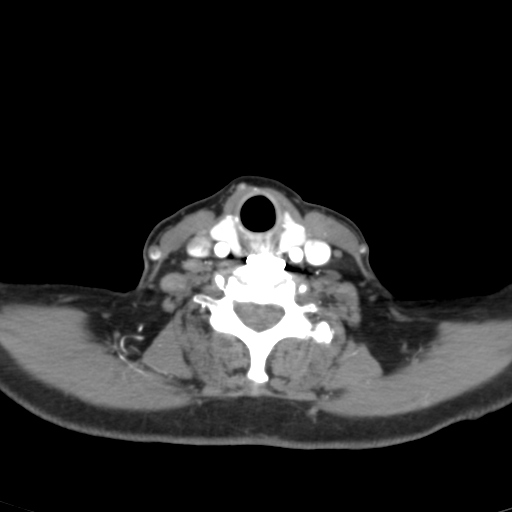
[im 119/178  bone]
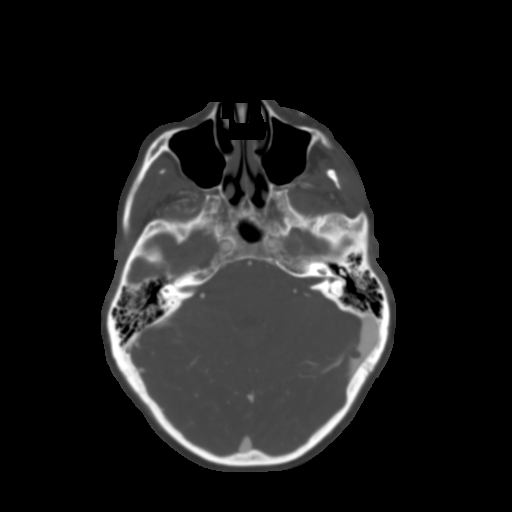

[2 of 16 positions shown; findings below may reference images not displayed]

FINDINGS: CT HEAD FINDINGS

Brain:

Cerebral volume is normal for age.

Redemonstrated small chronic infarct within the right cerebellar
hemisphere (series 3, image 6).

There is no acute intracranial hemorrhage.

No demarcated cortical infarct.

No extra-axial fluid collection.

No evidence of intracranial mass.

No midline shift.

Vascular: Reported below.

Skull: Normal. Negative for fracture or focal lesion.

Sinuses: Visualized orbits show no acute finding.

Orbits: No significant paranasal sinus disease or mastoid effusion.

Review of the MIP images confirms the above findings

CTA NECK FINDINGS

Aortic arch: Standard aortic branching. Mild scattered mixed plaque
within the visualized aortic arch and proximal major branch vessels
of the neck. No hemodynamically significant innominate or proximal
subclavian artery stenosis.

Right carotid system: CCA and ICA patent within the neck without
stenosis. Minimal mixed plaque within the proximal ICA

Left carotid system: See seen ICA patent within the neck without
stenosis. Minimal mixed plaque within the proximal ICA.

Vertebral arteries: Patent within the neck bilaterally. The right
vertebral artery is dominant. Soft plaque results in moderate
atherosclerotic narrowing of the proximal left V1 segment.

Skeleton: No acute bony abnormality or aggressive osseous lesion.
Prior C4-C7 ACDF. No evidence of hardware compromise. Redemonstrated
chronic mildly displaced fracture of the right C2 pedicle. Unchanged
chronic 5 mm C2-C3 grade 1 anterolisthesis with possible fusion
across the disc space at this level.

Other neck: No neck mass or cervical lymphadenopathy. Thyroid
unremarkable.

Upper chest: No consolidation within the imaged lung apices.

Review of the MIP images confirms the above findings

CTA HEAD FINDINGS

Anterior circulation:

The intracranial internal carotid arteries are patent.

The M1 middle cerebral arteries are patent without significant
stenosis. No M2 proximal branch occlusion or high-grade proximal
stenosis is identified.

The anterior cerebral arteries are patent.

Posterior circulation:

The intracranial vertebral arteries are patent. The basilar artery
is developmentally diminutive in the presence of large bilateral
posterior communicating arteries. The basilar artery is patent
without significant stenosis. The posterior cerebral arteries are
patent bilaterally. There are hypoplastic P1 segments bilaterally.

Venous sinuses: Within limitations of contrast timing, no convincing
thrombus.

Anatomic variants: As described

Review of the MIP images confirms the above findings
IMPRESSION: CT head:

1. No evidence of acute intracranial abnormality.
2. Redemonstrated small chronic infarct within the right cerebellar
hemisphere.

CTA neck:

1. The bilateral common and internal carotid arteries are patent
within the neck without stenosis. Minimal mixed plaque within the
proximal ICAs bilaterally.
2. The vertebral arteries are patent within the neck bilaterally.
Soft plaque results in moderate narrowing of the proximal V1 non
dominant left vertebral artery.
3. Prior C4-C7 ACDF.
4. Chronic mildly displaced fracture deformity of the right C2
pedicle with chronic C2-C3 grade 1 anterolisthesis.

CTA head:

1. No intracranial large vessel occlusion or proximal high-grade
arterial stenosis.
2. Developmentally diminutive basilar artery in the presence of
large bilateral posterior communicating arteries.

## 2022-09-04 ENCOUNTER — Encounter (HOSPITAL_BASED_OUTPATIENT_CLINIC_OR_DEPARTMENT_OTHER): Payer: Self-pay | Admitting: Obstetrics & Gynecology

## 2022-09-05 ENCOUNTER — Encounter (HOSPITAL_BASED_OUTPATIENT_CLINIC_OR_DEPARTMENT_OTHER): Payer: Self-pay | Admitting: Obstetrics & Gynecology

## 2022-09-05 ENCOUNTER — Other Ambulatory Visit: Payer: Self-pay
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
## 2022-09-05 NOTE — Progress Notes (Signed)
Your procedure is scheduled on Wednesday, 09/18/22.  Report to [facility].   Call this number if you have problems the morning of surgery  :[phone].   OUR ADDRESS IS [address] [place].  WE ARE LOCATED IN [facility].  PLEASE BRING YOUR INSURANCE CARD AND PHOTO ID DAY OF SURGERY.  ONLY 2 PEOPLE ARE ALLOWED IN  WAITING  ROOM.                                      REMEMBER:  DO NOT EAT FOOD, CANDY GUM OR MINTS  AFTER MIDNIGHT THE NIGHT BEFORE YOUR SURGERY . YOU MAY HAVE CLEAR LIQUIDS FROM MIDNIGHT THE NIGHT BEFORE YOUR SURGERY UNTIL  4:30 AM. NO CLEAR LIQUIDS AFTER   4:30 AM DAY OF SURGERY.  YOU MAY  BRUSH YOUR TEETH MORNING OF SURGERY AND RINSE YOUR MOUTH OUT, NO CHEWING GUM CANDY OR MINTS.     CLEAR LIQUID DIET   Foods Allowed                                                                     Foods Excluded  Coffee and tea, regular and decaf                             liquids that you cannot  Plain Jell-O                                                                   see through such as: Fruit ices (not with fruit pulp)                                     milk, soups, orange juice  Plain  Popsicles                                    All solid food Carbonated beverages, regular and diet                                    Cranberry, grape and apple juices Sports drinks like Gatorade _____________________________________________________________________     TAKE ONLY THESE MEDICATIONS MORNING OF SURGERY: Zovirax tablets as needed, Restasis eye drops as needed, Cymblata, Flonase as needed, Gabapentin, Robaxin, Lopressor, Singulair PLEASE CALL THE PRESCRIBING PROVIDER OF ASA 81 MG AND ASK FOR INSTRUCTIONS ON HOW TO TAKE ASA 81 MG FOR SURGERY.    UP TO 4 VISITORS  MAY VISIT IN THE EXTENDED RECOVERY ROOM UNTIL 800 PM ONLY.  ONE  VISITOR AGE 35 AND OVER MAY SPEND THE NIGHT AND MUST BE IN EXTENDED  RECOVERY ROOM NO LATER  THAN 800 PM . YOUR DISCHARGE TIME AFTER YOU SPEND THE NIGHT IS 900 AM THE MORNING AFTER YOUR SURGERY.  YOU MAY PACK A SMALL OVERNIGHT BAG WITH TOILETRIES FOR YOUR OVERNIGHT STAY IF YOU WISH.  YOUR PRESCRIPTION MEDICATIONS WILL BE PROVIDED DURING [facility].                                      DO NOT WEAR JEWERLY, MAKE UP. DO NOT WEAR LOTIONS, POWDERS, PERFUMES OR NAIL POLISH ON YOUR FINGERNAILS. TOENAIL POLISH IS OK TO WEAR. DO NOT SHAVE FOR 48 HOURS PRIOR TO DAY OF SURGERY. MEN MAY SHAVE FACE AND NECK. CONTACTS, GLASSES, OR DENTURES MAY NOT BE WORN TO SURGERY.  REMEMBER: NO SMOKING, VAPING, DRUGS OR ALCOHOL FOR 24 HOURS BEFORE YOUR SURGERY.                                    [facility] IS NOT RESPONSIBLE  FOR ANY BELONGINGS.                                                                    Marland Kitchen            - Preparing for Surgery Before surgery, you can play an important role.  Because skin is not sterile, your skin needs to be as free of germs as possible.  You can reduce the number of germs on your skin by washing with CHG (chlorahexidine gluconate) soap before surgery.  CHG is an antiseptic cleaner which kills germs and bonds with the skin to continue killing germs even after washing. Please DO NOT use if you have an allergy to CHG or antibacterial soaps.  If your skin becomes reddened/irritated stop using the CHG and inform your nurse when you arrive at Short Stay. Do not shave (including legs and underarms) for at least 48 hours prior to the first CHG shower.  You may shave your face/neck. Please follow these instructions carefully:  1.  Shower with CHG Soap the night before surgery and the  morning of Surgery.  2.  If you choose to wash your hair, wash your hair first as usual with your  normal  shampoo.  3.  After you shampoo, rinse your hair and body thoroughly to remove the  shampoo.                                        4.  Use CHG as you would any other liquid  soap.  You can apply chg directly  to the skin and wash , chg soap provided, night before and morning of your surgery.  5.  Apply the CHG Soap to your body ONLY FROM THE NECK DOWN.   Do not use on face/ open                           Wound or open sores. Avoid contact with eyes, ears  mouth and genitals (private parts).                       Wash face,  Genitals (private parts) with your normal soap.             6.  Wash thoroughly, paying special attention to the area where your surgery  will be performed.  7.  Thoroughly rinse your body with warm water from the neck down.  8.  DO NOT shower/wash with your normal soap after using and rinsing off  the CHG Soap.             9.  Pat yourself dry with a clean towel.            10.  Wear clean pajamas.            11.  Place clean sheets on your bed the night of your first shower and do not  sleep with pets. Day of Surgery : Do not apply any lotions/deodorants the morning of surgery.  Please wear clean clothes to the hospital/surgery center.  IF YOU HAVE ANY SKIN IRRITATION OR PROBLEMS WITH THE SURGICAL SOAP, PLEASE GET A BAR OF GOLD DIAL SOAP AND SHOWER THE NIGHT BEFORE YOUR SURGERY AND THE MORNING OF YOUR SURGERY. PLEASE LET THE NURSE KNOW MORNING OF YOUR SURGERY IF YOU HAD ANY PROBLEMS WITH THE SURGICAL SOAP.   ________________________________________________________________________                                                        QUESTIONS [facility] PRE OP NURSE PHONE [phone].

## 2022-09-09 ENCOUNTER — Telehealth: Payer: Self-pay | Admitting: *Deleted

## 2022-09-09 NOTE — Telephone Encounter (Signed)
   Pre-operative Risk Assessment    Patient Name: Jessica Ramsey  DOB: 31-Mar-1956 MRN: [id number]      Request for Surgical Clearance    Procedure:   TOTAL LAPAROSCOPIC HYSTERECTOMY WITH B/L SALPINGO-OOPHORECTOMY CYSTOSCOPY   Date of Surgery:  Clearance 09/18/22                                 Surgeon:  DR. MARY MILLER  Surgeon's Group or Practice Name:   Phone number:  [phone] Fax number:  [phone]   Type of Clearance Requested:   - Medical ; ASA   Type of Anesthesia:   CHOICE   Additional requests/questions:    [name]   09/09/2022, 9:15 AM

## 2022-09-09 NOTE — Telephone Encounter (Signed)
I received this message today, see below. I tried to call the pt to offer to move her appt from 09/20/22 with [name], NP up sooner, though no answer.   I have reviewed the chart and I do not see where the pt has been cleared. See notes below the pt stated she was cleared. I will have the pre op provider review as well.   We can offer a sooner appt in the office with either Richardson Dopp, PAC or [name], NP for pre op clearance. I will try to reach the pt again later.

## 2022-09-09 NOTE — Telephone Encounter (Signed)
-----   Message from Audria Nine sent at 09/09/2022  7:40 AM EDT ----- Regarding: FW: cardiac clearance  ----- Message ----- From: Imogene Burn, PA-C Sent: 09/06/2022   7:36 AM EDT To: [facility] Subject: FW: cardiac clearance                           ----- Message ----- From: Megan Salon, MD Sent: 09/05/2022   3:43 PM EDT To: Emmaline Life, NP Subject: cardiac clearance                              I need some help with the above patient.  She has been scheduled and rescheduled several times for a hysterectomy due to cardiac issues.  Anesthesia need cardiac clearance for this pt.  [name], NP, has been seeing her.  The patient told me after her last visit that she was cleared to proceed but I can't find any actual documentation of this either.   Can someone please help me with the documentation needed to proceed with her surgery or getting her scheduled for any needed testing.  Hysterectomy is scheduled the last week in October.  Thank you.  Edwinna Areola

## 2022-09-09 NOTE — Telephone Encounter (Signed)
I s/w the pt and she is agreeable to move appt up sooner for pre op clearance. Pt states that [name], NP told her she cleared her while they were going over her monitor results. I stated to the pt that nor I or the pre op provider saw that this was documented. I state to the pt let's mover her appt up sooner as to try and finalize the clearance request.   Pt is agreeable and thanked me for the help. I assured the pt that I will also update Dr. Sabra Heck.

## 2022-09-10 ENCOUNTER — Other Ambulatory Visit (HOSPITAL_COMMUNITY): Payer: Self-pay
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
## 2022-09-11 ENCOUNTER — Encounter: Payer: Self-pay | Admitting: Nurse Practitioner

## 2022-09-11 ENCOUNTER — Ambulatory Visit: Payer: Medicare Other | Attending: Nurse Practitioner | Admitting: Nurse Practitioner

## 2022-09-11 VITALS — BP 108/62 | HR 58 | Ht 59.5 in | Wt 112.4 lb

## 2022-09-11 DIAGNOSIS — Z01810 Encounter for preprocedural cardiovascular examination: Secondary | ICD-10-CM | POA: Insufficient documentation

## 2022-09-11 DIAGNOSIS — I10 Essential (primary) hypertension: Secondary | ICD-10-CM | POA: Diagnosis present

## 2022-09-11 DIAGNOSIS — R55 Syncope and collapse: Secondary | ICD-10-CM | POA: Insufficient documentation

## 2022-09-11 DIAGNOSIS — Z952 Presence of prosthetic heart valve: Secondary | ICD-10-CM | POA: Insufficient documentation

## 2022-09-11 DIAGNOSIS — I5032 Chronic diastolic (congestive) heart failure: Secondary | ICD-10-CM | POA: Insufficient documentation

## 2022-09-11 NOTE — Patient Instructions (Signed)
Medication Instructions:  Your physician recommends that you continue on your current medications as directed. Please refer to the Current Medication list given to you today.  *If you need a refill on your cardiac medications before your next appointment, please call your pharmacy*   Follow-Up: At [facility], you and your health needs are our priority.  As part of our continuing mission to provide you with exceptional heart care, we have created designated Provider Care Teams.  These Care Teams include your primary Cardiologist (physician) and Advanced Practice Providers (APPs -  Physician Assistants and Nurse Practitioners) who all work together to provide you with the care you need, when you need it.  We recommend signing up for the patient portal called "MyChart".  Sign up information is provided on this After Visit Summary.  MyChart is used to connect with patients for Virtual Visits (Telemedicine).  Patients are able to view lab/test results, encounter notes, upcoming appointments, etc.  Non-urgent messages can be sent to your provider as well.   To learn more about what you can do with MyChart, go to [email or web address].    Your next appointment:   3 month(s)  The format for your next appointment:   In Person  Provider:   [name], NP or [name] NP   Important Information About Sugar

## 2022-09-12 NOTE — Progress Notes (Signed)
Pt has cardiac clearance office visit by [name] NP on 09-11-2022 in epic/ chart.

## 2022-09-13 ENCOUNTER — Ambulatory Visit (INDEPENDENT_AMBULATORY_CARE_PROVIDER_SITE_OTHER): Payer: Medicare Other | Admitting: Obstetrics & Gynecology

## 2022-09-13 ENCOUNTER — Encounter (HOSPITAL_BASED_OUTPATIENT_CLINIC_OR_DEPARTMENT_OTHER): Payer: Self-pay | Admitting: Obstetrics & Gynecology

## 2022-09-13 VITALS — BP 128/74 | HR 57 | Ht 59.5 in | Wt 112.4 lb

## 2022-09-13 DIAGNOSIS — I10 Essential (primary) hypertension: Secondary | ICD-10-CM

## 2022-09-13 DIAGNOSIS — R87613 High grade squamous intraepithelial lesion on cytologic smear of cervix (HGSIL): Secondary | ICD-10-CM | POA: Diagnosis not present

## 2022-09-13 DIAGNOSIS — Z9889 Other specified postprocedural states: Secondary | ICD-10-CM | POA: Diagnosis not present

## 2022-09-13 DIAGNOSIS — B977 Papillomavirus as the cause of diseases classified elsewhere: Secondary | ICD-10-CM | POA: Diagnosis not present

## 2022-09-13 DIAGNOSIS — N870 Mild cervical dysplasia: Secondary | ICD-10-CM

## 2022-09-13 DIAGNOSIS — Z952 Presence of prosthetic heart valve: Secondary | ICD-10-CM

## 2022-09-13 DIAGNOSIS — F17290 Nicotine dependence, other tobacco product, uncomplicated: Secondary | ICD-10-CM

## 2022-09-16 ENCOUNTER — Other Ambulatory Visit (HOSPITAL_BASED_OUTPATIENT_CLINIC_OR_DEPARTMENT_OTHER): Payer: Self-pay | Admitting: Obstetrics & Gynecology

## 2022-09-16 ENCOUNTER — Encounter (HOSPITAL_COMMUNITY)
Admission: RE | Admit: 2022-09-16 | Discharge: 2022-09-16 | Disposition: A | Discharge status: Home / Self Care | Payer: Medicare Other | Source: Ambulatory Visit | Attending: Obstetrics & Gynecology | Admitting: Obstetrics & Gynecology

## 2022-09-16 ENCOUNTER — Encounter: Payer: Self-pay | Admitting: *Deleted

## 2022-09-16 DIAGNOSIS — Z01818 Encounter for other preprocedural examination: Secondary | ICD-10-CM

## 2022-09-16 DIAGNOSIS — I351 Nonrheumatic aortic (valve) insufficiency: Secondary | ICD-10-CM | POA: Diagnosis not present

## 2022-09-16 DIAGNOSIS — Z01812 Encounter for preprocedural laboratory examination: Secondary | ICD-10-CM | POA: Insufficient documentation

## 2022-09-16 DIAGNOSIS — I350 Nonrheumatic aortic (valve) stenosis: Secondary | ICD-10-CM | POA: Diagnosis not present

## 2022-09-16 LAB — BASIC METABOLIC PANEL
Anion gap: 7 (ref 5–15)
BUN: 13 mg/dL (ref 8–23)
CO2: 30 mmol/L (ref 22–32)
Calcium: 9.6 mg/dL (ref 8.9–10.3)
Chloride: 100 mmol/L (ref 98–111)
Creatinine, Ser: 0.83 mg/dL (ref 0.44–1.00)
GFR, Estimated: >60 mL/min (ref >60)
Glucose, Bld: 81 mg/dL (ref 70–99)
Potassium: 3.8 mmol/L (ref 3.5–5.1)
Sodium: 137 mmol/L (ref 135–145)

## 2022-09-16 LAB — CBC
HCT: 39.3 % (ref 36.0–46.0)
Hemoglobin: 13.1 g/dL (ref 12.0–15.0)
MCH: 32.2 pg (ref 26.0–34.0)
MCHC: 33.3 g/dL (ref 30.0–36.0)
MCV: 96.6 fL (ref 80.0–100.0)
Platelets: 221 10*3/uL (ref 150–400)
RBC: 4.07 MIL/uL (ref 3.87–5.11)
RDW: 11.8 % (ref 11.5–15.5)
WBC: 6.5 10*3/uL (ref 4.0–10.5)
nRBC: 0 % (ref 0.0–0.2)

## 2022-09-17 NOTE — Anesthesia Preprocedure Evaluation (Signed)
Anesthesia Evaluation  Patient identified by MRN, date of birth, ID band Patient awake    Reviewed: Allergy & Precautions, H&P , NPO status , Patient's Chart, lab work & pertinent test results  History of Anesthesia Complications (+) PONV, Family history of anesthesia reaction and history of anesthetic complications  Airway Mallampati: III  TM Distance: >3 FB Neck ROM: Full    Dental no notable dental hx. (+) Partial Lower, Partial Upper, Dental Advisory Given,    Pulmonary neg pulmonary ROS, former smoker,    Pulmonary exam normal breath sounds clear to auscultation       Cardiovascular Exercise Tolerance: Good hypertension, Pt. on medications and Pt. on home beta blockers  Rhythm:Regular Rate:Normal     Neuro/Psych Anxiety Depression negative neurological ROS     GI/Hepatic Neg liver ROS, GERD  Medicated,  Endo/Other  Hypothyroidism   Renal/GU negative Renal ROS  negative genitourinary   Musculoskeletal  (+) Arthritis , Osteoarthritis,  Fibromyalgia -  Abdominal   Peds  Hematology negative hematology ROS (+)   Anesthesia Other Findings   Reproductive/Obstetrics negative OB ROS                           Anesthesia Physical Anesthesia Plan  ASA: 3  Anesthesia Plan: General   Post-op Pain Management: Tylenol PO (pre-op)*   Induction: Intravenous  PONV Risk Score and Plan: 4 or greater and Ondansetron, Dexamethasone and Scopolamine patch - Pre-op  Airway Management Planned: Oral ETT  Additional Equipment:   Intra-op Plan:   Post-operative Plan: Extubation in OR  Informed Consent: I have reviewed the patients History and Physical, chart, labs and discussed the procedure including the risks, benefits and alternatives for the proposed anesthesia with the patient or authorized representative who has indicated his/her understanding and acceptance.     Dental advisory  given  Plan Discussed with: CRNA  Anesthesia Plan Comments:        Anesthesia Quick Evaluation

## 2022-09-18 ENCOUNTER — Ambulatory Visit (HOSPITAL_BASED_OUTPATIENT_CLINIC_OR_DEPARTMENT_OTHER): Payer: Medicare Other | Admitting: Certified Registered Nurse Anesthetist

## 2022-09-18 ENCOUNTER — Other Ambulatory Visit: Payer: Self-pay

## 2022-09-18 ENCOUNTER — Ambulatory Visit (HOSPITAL_BASED_OUTPATIENT_CLINIC_OR_DEPARTMENT_OTHER)
Admission: RE | Admit: 2022-09-18 | Discharge: 2022-09-18 | Disposition: A | Discharge status: Home / Self Care | Payer: Medicare Other | Attending: Obstetrics & Gynecology | Admitting: Obstetrics & Gynecology

## 2022-09-18 ENCOUNTER — Encounter (HOSPITAL_BASED_OUTPATIENT_CLINIC_OR_DEPARTMENT_OTHER)
Admission: RE | Disposition: A | Discharge status: Home / Self Care | Payer: Self-pay | Source: Home / Self Care | Attending: Obstetrics & Gynecology

## 2022-09-18 ENCOUNTER — Encounter (HOSPITAL_BASED_OUTPATIENT_CLINIC_OR_DEPARTMENT_OTHER): Payer: Self-pay | Admitting: Obstetrics & Gynecology

## 2022-09-18 ENCOUNTER — Other Ambulatory Visit (HOSPITAL_BASED_OUTPATIENT_CLINIC_OR_DEPARTMENT_OTHER): Payer: Self-pay | Admitting: Obstetrics & Gynecology

## 2022-09-18 ENCOUNTER — Other Ambulatory Visit (HOSPITAL_COMMUNITY): Payer: Self-pay

## 2022-09-18 DIAGNOSIS — E039 Hypothyroidism, unspecified: Secondary | ICD-10-CM | POA: Diagnosis not present

## 2022-09-18 DIAGNOSIS — Z01818 Encounter for other preprocedural examination: Secondary | ICD-10-CM

## 2022-09-18 DIAGNOSIS — Z9889 Other specified postprocedural states: Secondary | ICD-10-CM | POA: Insufficient documentation

## 2022-09-18 DIAGNOSIS — N879 Dysplasia of cervix uteri, unspecified: Secondary | ICD-10-CM

## 2022-09-18 DIAGNOSIS — K219 Gastro-esophageal reflux disease without esophagitis: Secondary | ICD-10-CM | POA: Diagnosis not present

## 2022-09-18 DIAGNOSIS — N83312 Acquired atrophy of left ovary: Secondary | ICD-10-CM | POA: Diagnosis not present

## 2022-09-18 DIAGNOSIS — R87623 High grade squamous intraepithelial lesion on cytologic smear of vagina (HGSIL): Secondary | ICD-10-CM | POA: Diagnosis not present

## 2022-09-18 DIAGNOSIS — Z87891 Personal history of nicotine dependence: Secondary | ICD-10-CM | POA: Insufficient documentation

## 2022-09-18 DIAGNOSIS — K458 Other specified abdominal hernia without obstruction or gangrene: Secondary | ICD-10-CM

## 2022-09-18 DIAGNOSIS — M797 Fibromyalgia: Secondary | ICD-10-CM | POA: Diagnosis not present

## 2022-09-18 DIAGNOSIS — G8918 Other acute postprocedural pain: Secondary | ICD-10-CM

## 2022-09-18 DIAGNOSIS — N83311 Acquired atrophy of right ovary: Secondary | ICD-10-CM | POA: Diagnosis not present

## 2022-09-18 DIAGNOSIS — F17200 Nicotine dependence, unspecified, uncomplicated: Secondary | ICD-10-CM | POA: Insufficient documentation

## 2022-09-18 DIAGNOSIS — I10 Essential (primary) hypertension: Secondary | ICD-10-CM | POA: Diagnosis not present

## 2022-09-18 DIAGNOSIS — R87613 High grade squamous intraepithelial lesion on cytologic smear of cervix (HGSIL): Secondary | ICD-10-CM | POA: Insufficient documentation

## 2022-09-18 DIAGNOSIS — B977 Papillomavirus as the cause of diseases classified elsewhere: Secondary | ICD-10-CM | POA: Insufficient documentation

## 2022-09-18 DIAGNOSIS — N870 Mild cervical dysplasia: Secondary | ICD-10-CM | POA: Insufficient documentation

## 2022-09-18 HISTORY — PX: TOTAL LAPAROSCOPIC HYSTERECTOMY WITH SALPINGECTOMY: SHX6742

## 2022-09-18 HISTORY — DX: Dry eye syndrome of unspecified lacrimal gland: H04.129

## 2022-09-18 HISTORY — DX: Syncope and collapse: R55

## 2022-09-18 HISTORY — DX: Malignant melanoma of skin, unspecified: C43.9

## 2022-09-18 HISTORY — PX: CYSTOSCOPY: SHX5120

## 2022-09-18 HISTORY — DX: Trigger thumb, right thumb: M65.311

## 2022-09-18 LAB — TYPE AND SCREEN
ABO/RH(D): A POS
Antibody Screen: NEGATIVE

## 2022-09-18 LAB — HEMOGLOBIN: Hemoglobin: 12 g/dL (ref 12.0–15.0)

## 2022-09-18 SURGERY — HYSTERECTOMY, TOTAL, LAPAROSCOPIC, WITH SALPINGECTOMY
Anesthesia: General | Site: Pelvis

## 2022-09-18 MED ORDER — GABAPENTIN 100 MG PO CAPS
100.0000 mg | ORAL_CAPSULE | Freq: Three times a day (TID) | ORAL | Status: DC
  Administered 2022-09-18: 100 mg via ORAL

## 2022-09-18 MED ORDER — METRONIDAZOLE 500 MG/100ML IV SOLN
500.0000 mg | INTRAVENOUS | Status: AC
  Administered 2022-09-18: 500 mg via INTRAVENOUS

## 2022-09-18 MED ORDER — FENTANYL CITRATE (PF) 250 MCG/5ML IJ SOLN
INTRAMUSCULAR | Status: AC
  Filled 2022-09-18: qty 5

## 2022-09-18 MED ORDER — BUPIVACAINE HCL (PF) 0.25 % IJ SOLN
INTRAMUSCULAR | Status: DC | PRN
  Administered 2022-09-18: 10 mL

## 2022-09-18 MED ORDER — SCOPOLAMINE 1 MG/3DAYS TD PT72
1.0000 | MEDICATED_PATCH | TRANSDERMAL | Status: DC
  Administered 2022-09-18: 1.5 mg via TRANSDERMAL

## 2022-09-18 MED ORDER — ACETAMINOPHEN 500 MG PO TABS
1000.0000 mg | ORAL_TABLET | ORAL | Status: AC
  Administered 2022-09-18: 1000 mg via ORAL

## 2022-09-18 MED ORDER — PANTOPRAZOLE SODIUM 40 MG IV SOLR: 40.0000 mg | Freq: Every day | INTRAVENOUS | Status: DC

## 2022-09-18 MED ORDER — LIDOCAINE HCL (PF) 2 % IJ SOLN
INTRAMUSCULAR | Status: AC
  Filled 2022-09-18: qty 5

## 2022-09-18 MED ORDER — KETOROLAC TROMETHAMINE 15 MG/ML IJ SOLN: 15.0000 mg | Freq: Four times a day (QID) | INTRAMUSCULAR | Status: DC | PRN

## 2022-09-18 MED ORDER — ONDANSETRON HCL 4 MG/2ML IJ SOLN
INTRAMUSCULAR | Status: AC
  Filled 2022-09-18: qty 2

## 2022-09-18 MED ORDER — MIDAZOLAM HCL 5 MG/5ML IJ SOLN
INTRAMUSCULAR | Status: DC | PRN
  Administered 2022-09-18: 2 mg via INTRAVENOUS

## 2022-09-18 MED ORDER — DEXAMETHASONE SODIUM PHOSPHATE 10 MG/ML IJ SOLN
INTRAMUSCULAR | Status: DC | PRN
  Administered 2022-09-18: 5 mg via INTRAVENOUS

## 2022-09-18 MED ORDER — PROPOFOL 10 MG/ML IV BOLUS
INTRAVENOUS | Status: AC
  Filled 2022-09-18: qty 20

## 2022-09-18 MED ORDER — DEXAMETHASONE SODIUM PHOSPHATE 10 MG/ML IJ SOLN
INTRAMUSCULAR | Status: AC
  Filled 2022-09-18: qty 1

## 2022-09-18 MED ORDER — METHOCARBAMOL 500 MG PO TABS: 500.0000 mg | ORAL_TABLET | Freq: Three times a day (TID) | ORAL | Status: DC | PRN

## 2022-09-18 MED ORDER — SCOPOLAMINE 1 MG/3DAYS TD PT72
MEDICATED_PATCH | TRANSDERMAL | Status: AC
  Filled 2022-09-18: qty 1

## 2022-09-18 MED ORDER — METRONIDAZOLE 500 MG/100ML IV SOLN
INTRAVENOUS | Status: AC
  Filled 2022-09-18: qty 100

## 2022-09-18 MED ORDER — HYDROMORPHONE HCL 1 MG/ML IJ SOLN
0.2500 mg | INTRAMUSCULAR | Status: DC | PRN
  Administered 2022-09-18 (×3): 0.25 mg via INTRAVENOUS

## 2022-09-18 MED ORDER — ENOXAPARIN SODIUM 40 MG/0.4ML IJ SOSY
PREFILLED_SYRINGE | INTRAMUSCULAR | Status: AC
  Filled 2022-09-18: qty 0.4

## 2022-09-18 MED ORDER — GLYCOPYRROLATE PF 0.2 MG/ML IJ SOSY
PREFILLED_SYRINGE | INTRAMUSCULAR | Status: AC
  Filled 2022-09-18: qty 2

## 2022-09-18 MED ORDER — OXYCODONE-ACETAMINOPHEN 5-325 MG PO TABS
1.0000 | ORAL_TABLET | ORAL | Status: DC | PRN
  Administered 2022-09-18 (×2): 0.5 via ORAL

## 2022-09-18 MED ORDER — DEXTROSE-NACL 5-0.45 % IV SOLN: INTRAVENOUS | Status: DC

## 2022-09-18 MED ORDER — LACTATED RINGERS IV SOLN
INTRAVENOUS | Status: DC
  Administered 2022-09-18: 1000 mL via INTRAVENOUS

## 2022-09-18 MED ORDER — PHENYLEPHRINE 80 MCG/ML (10ML) SYRINGE FOR IV PUSH (FOR BLOOD PRESSURE SUPPORT)
PREFILLED_SYRINGE | INTRAVENOUS | Status: AC
  Filled 2022-09-18: qty 10

## 2022-09-18 MED ORDER — DULOXETINE HCL 60 MG PO CPEP: ORAL_CAPSULE | ORAL | 3 refills | Status: DC

## 2022-09-18 MED ORDER — EPHEDRINE SULFATE (PRESSORS) 50 MG/ML IJ SOLN
INTRAMUSCULAR | Status: DC | PRN
  Administered 2022-09-18: 20 mg via INTRAVENOUS

## 2022-09-18 MED ORDER — MENTHOL 3 MG MT LOZG: 1.0000 | LOZENGE | OROMUCOSAL | Status: DC | PRN

## 2022-09-18 MED ORDER — GLYCOPYRROLATE 0.2 MG/ML IJ SOLN
INTRAMUSCULAR | Status: DC | PRN
  Administered 2022-09-18: .4 mg via INTRAVENOUS

## 2022-09-18 MED ORDER — ENOXAPARIN SODIUM 40 MG/0.4ML IJ SOSY
40.0000 mg | PREFILLED_SYRINGE | INTRAMUSCULAR | Status: AC
  Administered 2022-09-18: 40 mg via SUBCUTANEOUS

## 2022-09-18 MED ORDER — OXYCODONE-ACETAMINOPHEN 10-325 MG PO TABS
1.0000 | ORAL_TABLET | Freq: Four times a day (QID) | ORAL | 0 refills | Status: AC | PRN
  Filled 2022-09-18: qty 20, 5d supply, fill #0

## 2022-09-18 MED ORDER — ALUM & MAG HYDROXIDE-SIMETH 200-200-20 MG/5ML PO SUSP: 30.0000 mL | ORAL | Status: DC | PRN

## 2022-09-18 MED ORDER — SIMETHICONE 80 MG PO CHEW: 80.0000 mg | CHEWABLE_TABLET | Freq: Four times a day (QID) | ORAL | Status: DC | PRN

## 2022-09-18 MED ORDER — ONDANSETRON HCL 4 MG/2ML IJ SOLN
INTRAMUSCULAR | Status: DC | PRN
  Administered 2022-09-18: 4 mg via INTRAVENOUS

## 2022-09-18 MED ORDER — ACETAMINOPHEN 500 MG PO TABS
ORAL_TABLET | ORAL | Status: AC
  Filled 2022-09-18: qty 2

## 2022-09-18 MED ORDER — HYDROMORPHONE HCL 1 MG/ML IJ SOLN
INTRAMUSCULAR | Status: AC
  Filled 2022-09-18: qty 1

## 2022-09-18 MED ORDER — ARTIFICIAL TEARS OPHTHALMIC OINT
TOPICAL_OINTMENT | OPHTHALMIC | Status: AC
  Filled 2022-09-18: qty 3.5

## 2022-09-18 MED ORDER — PHENYLEPHRINE HCL-NACL 20-0.9 MG/250ML-% IV SOLN
INTRAVENOUS | Status: DC | PRN
  Administered 2022-09-18: 40 ug/min via INTRAVENOUS

## 2022-09-18 MED ORDER — OXYCODONE-ACETAMINOPHEN 5-325 MG PO TABS
ORAL_TABLET | ORAL | Status: AC
  Filled 2022-09-18: qty 1

## 2022-09-18 MED ORDER — ROCURONIUM BROMIDE 10 MG/ML (PF) SYRINGE
PREFILLED_SYRINGE | INTRAVENOUS | Status: AC
  Filled 2022-09-18: qty 10

## 2022-09-18 MED ORDER — SODIUM CHLORIDE 0.9 % IV SOLN
INTRAVENOUS | Status: AC | PRN
  Administered 2022-09-18: 650 mL via INTRAMUSCULAR

## 2022-09-18 MED ORDER — SUGAMMADEX SODIUM 200 MG/2ML IV SOLN
INTRAVENOUS | Status: DC | PRN
  Administered 2022-09-18: 100 mg via INTRAVENOUS

## 2022-09-18 MED ORDER — HEMOSTATIC AGENTS (NO CHARGE) OPTIME
TOPICAL | Status: DC | PRN
  Administered 2022-09-18: 1 via TOPICAL

## 2022-09-18 MED ORDER — GABAPENTIN 100 MG PO CAPS
ORAL_CAPSULE | ORAL | Status: AC
  Filled 2022-09-18: qty 1

## 2022-09-18 MED ORDER — ONDANSETRON HCL 4 MG PO TABS: 4.0000 mg | ORAL_TABLET | Freq: Four times a day (QID) | ORAL | Status: DC | PRN

## 2022-09-18 MED ORDER — LIDOCAINE 2% (20 MG/ML) 5 ML SYRINGE
INTRAMUSCULAR | Status: DC | PRN
  Administered 2022-09-18: 60 mg via INTRAVENOUS

## 2022-09-18 MED ORDER — FENTANYL CITRATE (PF) 100 MCG/2ML IJ SOLN
INTRAMUSCULAR | Status: DC | PRN
  Administered 2022-09-18 (×3): 50 ug via INTRAVENOUS

## 2022-09-18 MED ORDER — GENTAMICIN SULFATE 40 MG/ML IJ SOLN
5.0000 mg/kg | INTRAVENOUS | Status: AC
  Administered 2022-09-18: 260 mg via INTRAVENOUS
  Filled 2022-09-18: qty 6.5

## 2022-09-18 MED ORDER — POVIDONE-IODINE 10 % EX SWAB: 2.0000 | Freq: Once | CUTANEOUS | Status: DC

## 2022-09-18 MED ORDER — ONDANSETRON HCL 4 MG/2ML IJ SOLN: 4.0000 mg | Freq: Four times a day (QID) | INTRAMUSCULAR | Status: DC | PRN

## 2022-09-18 MED ORDER — ROCURONIUM BROMIDE 10 MG/ML (PF) SYRINGE
PREFILLED_SYRINGE | INTRAVENOUS | Status: DC | PRN
  Administered 2022-09-18: 40 mg via INTRAVENOUS

## 2022-09-18 MED ORDER — PROPOFOL 10 MG/ML IV BOLUS
INTRAVENOUS | Status: DC | PRN
  Administered 2022-09-18: 50 mg via INTRAVENOUS
  Administered 2022-09-18: 30 mg via INTRAVENOUS

## 2022-09-18 MED ORDER — HYDROMORPHONE HCL 1 MG/ML IJ SOLN: 0.2000 mg | INTRAMUSCULAR | Status: DC | PRN

## 2022-09-18 MED ORDER — MIDAZOLAM HCL 2 MG/2ML IJ SOLN
INTRAMUSCULAR | Status: AC
  Filled 2022-09-18: qty 2

## 2022-09-18 MED ORDER — PHENYLEPHRINE 80 MCG/ML (10ML) SYRINGE FOR IV PUSH (FOR BLOOD PRESSURE SUPPORT)
PREFILLED_SYRINGE | INTRAVENOUS | Status: DC | PRN
  Administered 2022-09-18: 240 ug via INTRAVENOUS
  Administered 2022-09-18: 40 ug via INTRAVENOUS
  Administered 2022-09-18: 80 ug via INTRAVENOUS

## 2022-09-18 SURGICAL SUPPLY — 68 items
ADH SKN CLS APL DERMABOND .7 (GAUZE/BANDAGES/DRESSINGS) ×2
APL PRP STRL LF DISP 70% ISPRP (MISCELLANEOUS) ×2
APL SRG 38 LTWT LNG FL B (MISCELLANEOUS) ×2
APL SWBSTK 6 STRL LF DISP (MISCELLANEOUS) ×2
APPLICATOR ARISTA FLEXITIP XL (MISCELLANEOUS) IMPLANT
APPLICATOR COTTON TIP 6 STRL (MISCELLANEOUS) IMPLANT
APPLICATOR COTTON TIP 6IN STRL (MISCELLANEOUS) ×2
BLADE SURG 10 STRL SS (BLADE) IMPLANT
CABLE HIGH FREQUENCY MONO STRZ (ELECTRODE) IMPLANT
CELL SAVER LIPIGURD (MISCELLANEOUS) IMPLANT
CHLORAPREP W/TINT 26 (MISCELLANEOUS) ×2 IMPLANT
COVER BACK TABLE 60X90IN (DRAPES) ×2 IMPLANT
COVER MAYO STAND STRL (DRAPES) ×2 IMPLANT
COVER SURGICAL LIGHT HANDLE (MISCELLANEOUS) IMPLANT
DEFOGGER SCOPE WARMER CLEARIFY (MISCELLANEOUS) IMPLANT
DERMABOND ADVANCED .7 DNX12 (GAUZE/BANDAGES/DRESSINGS) ×2 IMPLANT
DEVICE RETRIEVAL ALEXIS 14 (MISCELLANEOUS) IMPLANT
DILATOR CANAL MILEX (MISCELLANEOUS) IMPLANT
DRSG COVADERM PLUS 2X2 (GAUZE/BANDAGES/DRESSINGS) IMPLANT
EXTRT SYSTEM ALEXIS 14CM (MISCELLANEOUS)
EXTRT SYSTEM ALEXIS 17CM (MISCELLANEOUS)
GAUZE 4X4 16PLY ~~LOC~~+RFID DBL (SPONGE) ×4 IMPLANT
GLOVE BIO SURGEON STRL SZ 6.5 (GLOVE) ×2 IMPLANT
GLOVE BIOGEL PI IND STRL 6.5 (GLOVE) ×2 IMPLANT
GLOVE BIOGEL PI IND STRL 7.0 (GLOVE) ×4 IMPLANT
GLOVE ECLIPSE 6.5 STRL STRAW (GLOVE) ×4 IMPLANT
GOWN STRL REUS W/TWL XL LVL3 (GOWN DISPOSABLE) ×4 IMPLANT
HEMOSTAT ARISTA ABSORB 3G PWDR (HEMOSTASIS) IMPLANT
KIT TURNOVER CYSTO (KITS) ×2 IMPLANT
LEGGING LITHOTOMY PAIR STRL (DRAPES) ×2 IMPLANT
LIGASURE VESSEL 5MM BLUNT TIP (ELECTROSURGICAL) ×2 IMPLANT
NDL INSUFFLATION 14GA 120MM (NEEDLE) ×2 IMPLANT
NEEDLE INSUFFLATION 14GA 120MM (NEEDLE) ×2 IMPLANT
NS IRRIG 1000ML POUR BTL (IV SOLUTION) ×2 IMPLANT
OCCLUDER COLPOPNEUMO (BALLOONS) ×2 IMPLANT
PACK LAPAROSCOPY BASIN (CUSTOM PROCEDURE TRAY) ×2 IMPLANT
PACK TRENDGUARD 450 HYBRID PRO (MISCELLANEOUS) ×2 IMPLANT
PENCIL SMOKE EVACUATOR (MISCELLANEOUS) IMPLANT
POUCH LAPAROSCOPIC INSTRUMENT (MISCELLANEOUS) ×2 IMPLANT
PROTECTOR NERVE ULNAR (MISCELLANEOUS) ×4 IMPLANT
SCISSORS LAP 5X35 DISP (ENDOMECHANICALS) IMPLANT
SET IRRIG Y TYPE TUR BLADDER L (SET/KITS/TRAYS/PACK) ×2 IMPLANT
SET SUCTION IRRIG HYDROSURG (IRRIGATION / IRRIGATOR) ×2 IMPLANT
SET TRI-LUMEN FLTR TB AIRSEAL (TUBING) ×2 IMPLANT
SHEARS HARMONIC ACE PLUS 36CM (ENDOMECHANICALS) ×2 IMPLANT
SUT VIC AB 0 CT1 27 (SUTURE) ×4
SUT VIC AB 0 CT1 27XBRD ANBCTR (SUTURE) ×4 IMPLANT
SUT VIC AB 4-0 PS2 18 (SUTURE) ×2 IMPLANT
SUT VICRYL 0 UR6 27IN ABS (SUTURE) IMPLANT
SUT VLOC 180 0 9IN  GS21 (SUTURE) ×2
SUT VLOC 180 0 9IN GS21 (SUTURE) ×2 IMPLANT
SYR 10ML LL (SYRINGE) ×2 IMPLANT
SYR 50ML LL SCALE MARK (SYRINGE) ×4 IMPLANT
SYSTEM CARTER THOMASON II (TROCAR) IMPLANT
SYSTEM CONTND EXTRCTN KII BLLN (MISCELLANEOUS) IMPLANT
TIP UTERINE 5.1X6CM LAV DISP (MISCELLANEOUS) IMPLANT
TIP UTERINE 6.7X10CM GRN DISP (MISCELLANEOUS) IMPLANT
TIP UTERINE 6.7X6CM WHT DISP (MISCELLANEOUS) IMPLANT
TIP UTERINE 6.7X8CM BLUE DISP (MISCELLANEOUS) IMPLANT
TOWEL OR 17X26 10 PK STRL BLUE (TOWEL DISPOSABLE) ×4 IMPLANT
TRAY FOLEY W/BAG SLVR 14FR LF (SET/KITS/TRAYS/PACK) ×2 IMPLANT
TRENDGUARD 450 HYBRID PRO PACK (MISCELLANEOUS) ×2
TROCAR ADV FIXATION 5X100MM (TROCAR) ×2 IMPLANT
TROCAR KII 8X100ML NONTHREADED (TROCAR) ×2 IMPLANT
TROCAR PORT AIRSEAL 5X120 (TROCAR) ×2 IMPLANT
TROCAR Z-THREAD FIOS 5X100MM (TROCAR) ×2 IMPLANT
WARMER LAPAROSCOPE (MISCELLANEOUS) ×2 IMPLANT
WATER STERILE IRR 3000ML UROMA (IV SOLUTION) ×2 IMPLANT

## 2022-09-18 NOTE — Transfer of Care (Signed)
Immediate Anesthesia Transfer of Care Note  Patient: Jessica Ramsey  Procedure(s) Performed: TOTAL LAPAROSCOPIC HYSTERECTOMY WITH SALPINGO-OOPHORECTOMY (Bilateral: Pelvis) CYSTOSCOPY (Bladder)  Patient Location: PACU  Anesthesia Type:General  Level of Consciousness: awake and patient cooperative  Airway & Oxygen Therapy: Patient Spontanous Breathing  Post-op Assessment: Report given to RN and Post -op Vital signs reviewed and stable  Post vital signs: Reviewed and stable  Last Vitals:  Vitals Value Taken Time  BP 137/81 09/18/22 0949  Temp    Pulse 85 09/18/22 0951  Resp 13 09/18/22 0951  SpO2 91 % 09/18/22 0951  Vitals shown include unvalidated device data.  Last Pain:  Vitals:   09/18/22 7543  TempSrc: Oral  PainSc: 2       Patients Stated Pain Goal: 5 (60/67/70 3403)  Complications: No notable events documented.
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
## 2022-09-18 NOTE — Progress Notes (Signed)
Upon pt ambulating to BR, RN noted pt's lower abdominal incision located in suprapubic area had significant amount of sanguinous oozing from site. Area cleaned, additional dermabond applied, Kpad removed from abdomen. Upon reassessment, site noted to have continued oozing of sanguinous drainage. Pressure dressing applied with 2x2 gauze and tape, ice pack to site. Small intact hematoma noted to site and intact hematoma noted to umibilical incision site as well, skin marked. Abdomen soft, all other abdominal incisions C/D/I. VSS. Daughter at bedside. Will continue to monitor and notify MD after ordered Hgb at 1500.

## 2022-09-18 NOTE — Anesthesia Postprocedure Evaluation (Signed)
Anesthesia Post Note  Patient: Jessica Ramsey  Procedure(s) Performed: TOTAL LAPAROSCOPIC HYSTERECTOMY WITH SALPINGO-OOPHORECTOMY (Bilateral: Pelvis) CYSTOSCOPY (Bladder)     Patient location during evaluation: PACU Anesthesia Type: General Level of consciousness: awake and alert Pain management: pain level controlled Vital Signs Assessment: post-procedure vital signs reviewed and stable Respiratory status: spontaneous breathing, nonlabored ventilation and respiratory function stable Cardiovascular status: blood pressure returned to baseline and stable Postop Assessment: no apparent nausea or vomiting Anesthetic complications: no   No notable events documented.  Last Vitals:  Vitals:   09/18/22 1100 09/18/22 1115  BP: 122/67 115/60  Pulse: 74 71  Resp: 11 12  Temp:  36.6 C  SpO2: 99% 98%    Last Pain:  Vitals:   09/18/22 1115  TempSrc:   PainSc: 3                  Weslee Fogg,W. EDMOND

## 2022-09-18 NOTE — Anesthesia Procedure Notes (Signed)
Procedure Name: Intubation Date/Time: 09/18/2022 7:43 AM  Performed by: Rogers Blocker, CRNAPre-anesthesia Checklist: Patient identified, Emergency Drugs available, Suction available and Patient being monitored Patient Re-evaluated:Patient Re-evaluated prior to induction Oxygen Delivery Method: Circle System Utilized Preoxygenation: Pre-oxygenation with 100% oxygen Induction Type: IV induction Ventilation: Mask ventilation without difficulty Laryngoscope Size: Mac and 3 Grade View: Grade I Tube type: Oral Tube size: 7.0 mm Number of attempts: 1 Airway Equipment and Method: Stylet and Bite block Placement Confirmation: ETT inserted through vocal cords under direct vision, positive ETCO2 and breath sounds checked- equal and bilateral Secured at: 21 cm Tube secured with: Tape Dental Injury: Teeth and Oropharynx as per pre-operative assessment

## 2022-09-18 NOTE — Progress Notes (Signed)
Umbilical incision remains unchanged from earlier assessment-C/D/I with quarter-sized hematoma to site. Pressure dressing to lower abdominal incision in suprapubic area C/D/I with no further bleeding noted. VSS. Abdomen soft and non-tender. Peripad with scant sanguinous drainage noted. Pt ambulating, voiding, tolerating PO intake, pain controlled with oral analgesics. MD notified, order to D/C patient home. Orders given for pt to remove pressure dsg Friday, resume mobic tomorrow on Thursday, and resume ASA on Friday s/p dressing removal if no bleeding from incision.   Lyndel Pleasure, RN
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
## 2022-09-18 NOTE — Progress Notes (Signed)
Ring with 3 dark colored stones taped to left middle finger due to inability to remove it per patient.

## 2022-09-18 NOTE — Op Note (Signed)
09/18/2022  9:40 AM  PATIENT:  Jessica Ramsey  66 y.o. female  PRE-OPERATIVE DIAGNOSIS:  HGSIL, h/o conization  POST-OPERATIVE DIAGNOSIS:  HGSIL, h/o conization  PROCEDURE:  Procedure(s): TOTAL LAPAROSCOPIC HYSTERECTOMY WITH SALPINGO-OOPHORECTOMY CYSTOSCOPY  SURGEON:  Megan Salon  ASSISTANTS: Aletha Halim.  An experienced assistant was required given the standard of surgical care given the complexity of the case.  This assistant was needed for exposure, dissection, suctioning, retraction, instrument exchange and for overall help during the procedure.  RNFA help was also unavailable.  ANESTHESIA:   general  ESTIMATED BLOOD LOSS: 25 mL  BLOOD ADMINISTERED:none   FLUIDS: 1000cc LR  UOP: 50cc clear UOP  SPECIMEN:  uterus, cervix, bilateral fallopian tubes and bilateral ovaries  DISPOSITION OF SPECIMEN:  PATHOLOGY  FINDINGS: small uterus, atrophic ovaries, small anterior wall hernia beneath the umbilicus with small amount of omentum in this that did obstruct visualization of pelvis initially, normal upper abdomen, small amount of ome  DESCRIPTION OF OPERATION:  Patient is taken to the operating room. She is placed in the supine position. She is a running IV in place. Informed consent was present on the chart. SCDs on her lower extremities and functioning properly. Patient was positioned while she was awake.  Her legs were placed in the low lithotomy position in [place]. Her arms were tucked by the side.  General endotracheal anesthesia was administered by the anesthesia staff without difficulty. Dr. Ola Spurr, anesthesia, oversaw case.  Time out performed.    Clora prep was then used to prep the abdomen and Hibiclens was used to prep the inner thighs, perineum and vagina. Once 3 minutes had past the patient was draped in a normal standard fashion. The legs were lifted to the high lithotomy position. The cervix was visualized by placing a heavy weighted speculum in the  posterior aspect of the vagina and using a curved Deaver retractor to the retract anteriorly. The anterior lip of the cervix was grasped with single-tooth tenaculum.  The cervix sounded to 6 cm. Pratt dilators were used to dilate the cervix up to a #23. A RUMI uterine manipulator was obtained. A #6 disposable tip was placed on the RUMI manipulator as well as a 3.0, silver KOH ring. This was passed through the cervix and the bulb of the disposable tip was inflated with 2 cc of normal saline. There was a good fit of the KOH ring around the cervix. The tenaculum was removed. There is also good manipulation of the uterus. The speculum and retractor were removed as well. A Foley catheter was placed to straight drain.  Clear urine was noted. Legs were lowered to the low lithotomy position and attention was turned the abdomen.  The umbilicus was everted.  Marcaine 0.25% used to anesthetize the skin.  Using #11 blade, 24m skin incision was made.  A Veress needle was obtained. Syringe of sterile saline was placed on a open Veress needle.  With the abdomen elevated, the Veress needle was passed into the umbilicus until the pop was heard and then fluid started to drip.  Then low flow CO2 gas was attached the needle and the pneumoperitoneum was achieved without difficulty. Once four liters of gas was in the abdomen the Veress needle was removed and a 5 millimeter non-bladed Optiview trocar and port were passed directly to the abdomen. The laparoscope was then used to confirm intraperitoneal placement. Findings noted above.  Locations for RLQ, LLQ, and suprapubic ports were noted by transillumination of the abdominal  wall.  0.25% marcaine was used to anesthetize the skin.  80m skin incision was made in the RLQ and an AirSeal port was placed underdirect visualization of the laparoscope.  Then a 556mskin incision was made and a 90m28monbladed trochar and port was placed in the LLQ.  Finally, and 8mm49min incision was made about  4cm above the pubic symphasis and an 8mm 67m-bladed port was placed with direct visualization of the laparoscope.  All trochars were removed.  The area where the omentum was in the small hernia was taken down with the harmonic scalpel after confirming bowel was not close.  Ureters were identifies.  Attention was turned to the left side. With uterus on stretch the left IP ligament was serially clamped cauterized and incised using the ligasure device. Left round ligament was serially clamped cauterized and incised. The anterior and posterior peritoneum of the inferior leaf of the broad ligament were opened. The beginning of the bladder flap was created.  The bladder was taken down below the level of the KOH ring. The left uterine artery skeletonized and then just superior to the KOH ring this vessel was serially clamped, cauterized, and incised.  Attention was turned the right side.  The uterus was placed on stretch to the opposite side.  After identification of the ureter, the right IP ligament was serially clamped cauterized and incised. Next the right round ligament was serially clamped cauterized and incised. The anterior posterior peritoneum of the inferiorly for the broad ligament were opened. The anterior peritoneum was carried across to the dissection on the left side. The remainder of the bladder flap was created using sharp dissection. The bladder was well below the level of the KOH ring. The left uterine artery skeletonized. Then the left uterine artery, above the level of the KOH ring, was serially clamped cauterized and incised. The uterus was devascularized at this point.  The colpotomy was performed a starting in the midline and using a harmonic scalpel with the inferior edge of the open blade  This was carried around a circumferential fashion until the vaginal mucosa was completely incised in the specimen was freed.  The specimen was then delivered to the vagina.  A vaginal occlusive device was  used to maintain the pneumoperitoneum  Instruments were changed with a needle driver and Kobra graspers.  Using a 9 inch V. lock suture, the cuff was closed by incorporating the anterior and posterior vaginal mucosa in each stitch. This was carried across all the way to the left corner and a running fashion. Two stitches were brought back towards the midline and the suture was cut flush with the vagina. The needle was brought out the pelvis. The pelvis was irrigated. All pedicles were inspected. No bleeding was noted.   Co2 pressures were lowered to 8mm H93m Again, no bleeding was noted.  Ureters were noted deep in the pelvis to be peristalsing.  Arista was placed along the pedicles.  At this point the procedure was completed.  The remaining instruments were removed.  The umbilical port was removed and the fascia closed with a #0 Vicryl.  Then the remaining ports (except the suprapubic port) were removed under direct visualization of the laparoscope and the pneumoperitoneum was relieved.  The patient was taken out of Trendelenburg positioning.  Several deep breaths were given to the patient's trying to any gas the abdomen and finally the suprapubic port was removed.  The skin was then closed with subcuticular stitches of 3-0  Vicryl. The skin was cleansed Dermabond was applied. Attention was then turned the vagina and the cuff was inspected. No bleeding was noted. The anterior posterior vaginal mucosa was incorporated in each stitch. The Foley catheter was removed.  Cystoscopy was performed.  No sutures or bladder injuries were noted.  Ureters were noted with normal urine jets from each one was seen.  Foley was left out after the cystoscopic fluid was drained and cystoscope removed.  Sponge, lap, needle, instrument counts were correct x2. Patient tolerated the procedure very well. She was awakened from anesthesia, extubated and taken to recovery in stable condition.   COUNTS:  YES  PLAN OF CARE: Transfer to  PACU

## 2022-09-19 ENCOUNTER — Encounter (HOSPITAL_BASED_OUTPATIENT_CLINIC_OR_DEPARTMENT_OTHER): Payer: Self-pay | Admitting: Obstetrics & Gynecology

## 2022-09-19 LAB — SURGICAL PATHOLOGY

## 2022-09-20 ENCOUNTER — Telehealth: Payer: Self-pay | Admitting: Cardiology

## 2022-09-20 ENCOUNTER — Ambulatory Visit: Payer: PPO | Admitting: Nurse Practitioner

## 2022-09-20 NOTE — Telephone Encounter (Signed)
Pt requesting provider switch. Pt states that it is closer for her to go to the [place] office. Please advise

## 2022-09-23 NOTE — Telephone Encounter (Signed)
OK by me 

## 2022-09-25 ENCOUNTER — Encounter (HOSPITAL_BASED_OUTPATIENT_CLINIC_OR_DEPARTMENT_OTHER): Payer: Self-pay | Admitting: Obstetrics & Gynecology

## 2022-09-25 ENCOUNTER — Ambulatory Visit (INDEPENDENT_AMBULATORY_CARE_PROVIDER_SITE_OTHER): Payer: Medicare Other | Admitting: Obstetrics & Gynecology

## 2022-09-25 VITALS — BP 148/71 | HR 59 | Ht 59.5 in | Wt 115.2 lb

## 2022-09-25 DIAGNOSIS — Z9889 Other specified postprocedural states: Secondary | ICD-10-CM

## 2022-09-25 NOTE — Progress Notes (Signed)
GYNECOLOGY  VISIT  CC:   post op recheck  HPI: 66 y.o. G84P1001 Divorced White or Caucasian female here for recheck after undergoing TLH/BSO/cystoscopy on 10/25.  She reports bleeding is none.  She has minimal surgical pain.  Bowel function is Normal.  Bladder function is normal.  She does have some bleeding from her suprapubic incision.  She has a dressing on it  Pathology reviewed:  Yes .  Questions answered.    MEDS:   Current Outpatient Medications on File Prior to Visit  Medication Sig Dispense Refill   acetaminophen (TYLENOL) 500 MG tablet Take 1,000 mg by mouth 3 (three) times daily as needed for mild pain or moderate pain.     acyclovir (ZOVIRAX) 400 MG tablet Take 1 tablet (400 mg total) by mouth 2 (two) times daily. If has outbreak, increased to 3 times daily for 5 days.  Using for suppressive therapy with occasional outbreaks. 180 tablet 4   acyclovir ointment (ZOVIRAX) 5 % Apply 1 application topically to the affected areas once every 3 hours. (Patient taking differently: Apply 1 application  topically as needed.) 15 g 2   aspirin EC 81 MG tablet Take 1 tablet (81 mg total) by mouth daily. Swallow whole. (Patient taking differently: Take 81 mg by mouth at bedtime. Swallow whole.) 90 tablet 3   Biotin 1000 MCG tablet Take 1,000 mcg by mouth daily.     Cyanocobalamin (B-12) 5000 MCG CAPS Take 5,000 mcg by mouth daily.     cycloSPORINE (RESTASIS) 0.05 % ophthalmic emulsion Place 1 drop into both eyes 2 times a day 180 each 12   DULoxetine (CYMBALTA) 60 MG capsule Take 1 capsule ('60mg'$ ) by mouth daily.  **Only take 1 pill due to it being '60mg'$  and not '30mg'$ .** 90 capsule 3   EPINEPHrine 0.3 mg/0.3 mL IJ SOAJ injection Inject 0.3 mg into the muscle as needed for anaphylaxis.     Eszopiclone 3 MG TABS Take 1 tablet by mouth at bedtime as needed for insomnia (Patient taking differently: Take 1.5 mg by mouth at bedtime. Takes 1/2 tablet every night.) 90 tablet 1   Flaxseed, Linseed, (FLAXSEED  OIL PO) Take 1,000 mg by mouth daily.     fluticasone (FLONASE) 50 MCG/ACT nasal spray Place 2 sprays into both nostrils daily as needed for allergies.      folic acid (FOLVITE) 297 MCG tablet Take 800 mcg by mouth at bedtime.     gabapentin (NEURONTIN) 100 MG capsule Take 1 capsule by mouth 3 times daily. 270 capsule 1   Glucosamine-Chondroitin (OSTEO BI-FLEX REGULAR STRENGTH PO) Take 1 tablet by mouth 2 (two) times daily.     lisinopril (ZESTRIL) 10 MG tablet Take 1 tablet (10 mg total) by mouth at bedtime. 90 tablet 3   melatonin 3 MG TABS tablet Take 3 mg by mouth at bedtime.     meloxicam (MOBIC) 15 MG tablet Take 1 tablet (15 mg total) by mouth daily. 90 tablet 3   methocarbamol (ROBAXIN) 500 MG tablet TAKE 1 TO 2 TABLETS EVERY 6 HOURS AS NEEDED FOR MUSCLE SPASMS 180 tablet 1   metoprolol tartrate (LOPRESSOR) 25 MG tablet Take 1/2 tablet (12.5 mg total) by mouth 2 times daily. 30 tablet 11   montelukast (SINGULAIR) 10 MG tablet Take tablet by mouth daily. 90 tablet 3   Multiple Vitamin (MULTIVITAMIN WITH MINERALS) TABS tablet Take 1 tablet by mouth at bedtime.     nicotine (NICODERM CQ - DOSED IN MG/24 HOURS) 21 mg/24hr  patch Place 1 patch (21 mg total) onto the skin daily at 6 (six) AM. (Patient taking differently: Place 21 mg onto the skin as needed.) 28 patch 0   Omega-3 Fatty Acids (FISH OIL PO) Take 1,500 mg by mouth 2 (two) times daily.     Probiotic Product (PROBIOTIC PO) Take 1 tablet by mouth daily.      UNABLE TO FIND Med Name: Nascent colloidial iodine takes 1 drop daily for hypothyroidism.     Vitamin E 450 MG (1000 UT) CAPS Take 1,000 Units by mouth at bedtime.     No current facility-administered medications on file prior to visit.    SH:  Smoking No    PHYSICAL EXAMINATION:    BP (!) 148/71 (BP Location: Right Arm, Patient Position: Sitting, Cuff Size: Normal)   Pulse (!) 59   Ht 4' 11.5" (1.511 m) Comment: Reported  Wt 115 lb 3.2 oz (52.3 kg)   LMP 11/25/2001  (Exact Date)   BMI 22.88 kg/m     General appearance: alert, cooperative and appears stated age CV:  Regular rate and rhythm Lungs:  clear to auscultation, no wheezes, rales or rhonchi, symmetric air entry Abdomen: soft, non-tender; bowel sounds normal; no masses,  no organomegaly Incisions:  Bruising around incisions, some dark blood from suprapubic incision, this was compressed and some additional old appearing blood present, no erythema or odor, once no more blood was present, benzoin and steri-strips applied.  Then new dressing applied.   Assessment/Plan: 1. Post-operative state - recheck 4 weeks.  Pt will watch incision closely and if continues to bleed or if any signs of infection, will let me know for recheck.

## 2022-10-22 ENCOUNTER — Encounter (HOSPITAL_BASED_OUTPATIENT_CLINIC_OR_DEPARTMENT_OTHER): Payer: Self-pay | Admitting: Obstetrics & Gynecology

## 2022-10-22 ENCOUNTER — Ambulatory Visit (INDEPENDENT_AMBULATORY_CARE_PROVIDER_SITE_OTHER): Payer: Medicare Other | Admitting: Obstetrics & Gynecology

## 2022-10-22 VITALS — BP 129/69 | HR 56 | Ht 59.5 in | Wt 112.6 lb

## 2022-10-22 DIAGNOSIS — Z9889 Other specified postprocedural states: Secondary | ICD-10-CM

## 2022-10-22 NOTE — Progress Notes (Signed)
GYNECOLOGY  VISIT  CC:   post op recheck  HPI: 66 y.o. G10P1001 Divorced White or Caucasian female here for recheck after undergoing TLH/BSO/cystoscopy on 09/13/2022.  Denies bleeding.  Incisions are healing well.  Bowel function normal.  Has urinary incontinence that is not new.  Having back issues and pain.  Has injection scheduled.   Pt has questions about what she can and cannot do at this point.  Other than pelvic rest, she can resume normal activities.  Follow up pap smear in 1 year discussed.  MEDS:   Current Outpatient Medications on File Prior to Visit  Medication Sig Dispense Refill   acetaminophen (TYLENOL) 500 MG tablet Take 1,000 mg by mouth 3 (three) times daily as needed for mild pain or moderate pain.     acyclovir (ZOVIRAX) 400 MG tablet Take 1 tablet (400 mg total) by mouth 2 (two) times daily. If has outbreak, increased to 3 times daily for 5 days.  Using for suppressive therapy with occasional outbreaks. 180 tablet 4   acyclovir ointment (ZOVIRAX) 5 % Apply 1 application topically to the affected areas once every 3 hours. (Patient taking differently: Apply 1 application  topically as needed.) 15 g 2   aspirin EC 81 MG tablet Take 1 tablet (81 mg total) by mouth daily. Swallow whole. (Patient taking differently: Take 81 mg by mouth at bedtime. Swallow whole.) 90 tablet 3   Biotin 1000 MCG tablet Take 1,000 mcg by mouth daily.     Cyanocobalamin (B-12) 5000 MCG CAPS Take 5,000 mcg by mouth daily.     cycloSPORINE (RESTASIS) 0.05 % ophthalmic emulsion Place 1 drop into both eyes 2 times a day 180 each 12   DULoxetine (CYMBALTA) 60 MG capsule Take 1 capsule ('60mg'$ ) by mouth daily.  **Only take 1 pill due to it being '60mg'$  and not '30mg'$ .** 90 capsule 3   EPINEPHrine 0.3 mg/0.3 mL IJ SOAJ injection Inject 0.3 mg into the muscle as needed for anaphylaxis.     Eszopiclone 3 MG TABS Take 1 tablet by mouth at bedtime as needed for insomnia (Patient taking differently: Take 1.5 mg by mouth  at bedtime. Takes 1/2 tablet every night.) 90 tablet 1   Flaxseed, Linseed, (FLAXSEED OIL PO) Take 1,000 mg by mouth daily.     fluticasone (FLONASE) 50 MCG/ACT nasal spray Place 2 sprays into both nostrils daily as needed for allergies.      folic acid (FOLVITE) 295 MCG tablet Take 800 mcg by mouth at bedtime.     gabapentin (NEURONTIN) 100 MG capsule Take 1 capsule by mouth 3 times daily. 270 capsule 1   Glucosamine-Chondroitin (OSTEO BI-FLEX REGULAR STRENGTH PO) Take 1 tablet by mouth 2 (two) times daily.     lisinopril (ZESTRIL) 10 MG tablet Take 1 tablet (10 mg total) by mouth at bedtime. 90 tablet 3   melatonin 3 MG TABS tablet Take 3 mg by mouth at bedtime.     meloxicam (MOBIC) 15 MG tablet Take 1 tablet (15 mg total) by mouth daily. 90 tablet 3   methocarbamol (ROBAXIN) 500 MG tablet TAKE 1 TO 2 TABLETS EVERY 6 HOURS AS NEEDED FOR MUSCLE SPASMS 180 tablet 1   metoprolol tartrate (LOPRESSOR) 25 MG tablet Take 1/2 tablet (12.5 mg total) by mouth 2 times daily. 30 tablet 11   montelukast (SINGULAIR) 10 MG tablet Take tablet by mouth daily. 90 tablet 3   Multiple Vitamin (MULTIVITAMIN WITH MINERALS) TABS tablet Take 1 tablet by mouth at bedtime.  nicotine (NICODERM CQ - DOSED IN MG/24 HOURS) 21 mg/24hr patch Place 1 patch (21 mg total) onto the skin daily at 6 (six) AM. (Patient taking differently: Place 21 mg onto the skin as needed.) 28 patch 0   Omega-3 Fatty Acids (FISH OIL PO) Take 1,500 mg by mouth 2 (two) times daily.     Probiotic Product (PROBIOTIC PO) Take 1 tablet by mouth daily.      traZODone (DESYREL) 50 MG tablet Take 25 mg by mouth at bedtime.     UNABLE TO FIND Med Name: Nascent colloidial iodine takes 1 drop daily for hypothyroidism.     Vitamin E 450 MG (1000 UT) CAPS Take 1,000 Units by mouth at bedtime.     No current facility-administered medications on file prior to visit.    SH:  Smoking Yes    PHYSICAL EXAMINATION:    BP 129/69 (BP Location: Right Arm,  Patient Position: Sitting, Cuff Size: Normal)   Pulse (!) 56   Ht 4' 11.5" (1.511 m) Comment: Reported  Wt 112 lb 9.6 oz (51.1 kg)   LMP 11/25/2001 (Exact Date)   BMI 22.36 kg/m     General appearance: alert, cooperative and appears stated age Abdomen: soft, non-tender; bowel sounds normal; no masses,  no organomegaly Incisions:  C/D/I  Pelvic: External genitalia:  no lesions              Urethra:  normal appearing urethra with no masses, tenderness or lesions              Bartholins and Skenes: normal                 Vagina: normal appearing vagina with normal color and discharge, no lesions              Cervix: absent              Bimanual Exam:  Uterus:   no mass present               Assessment/Plan: 1. Post-operative state - will plan follow up 1 year and pap/HR HPV at that time.  Pt advised to call with any concerns.  Pelvic rest for 12 weeks post op but she can resume normal activity.

## 2022-12-17 ENCOUNTER — Ambulatory Visit: Payer: Medicare Other | Admitting: Nurse Practitioner

## 2022-12-17 ENCOUNTER — Other Ambulatory Visit (HOSPITAL_COMMUNITY): Payer: Self-pay

## 2022-12-17 MED ORDER — ESZOPICLONE 3 MG PO TABS
3.0000 mg | ORAL_TABLET | Freq: Every evening | ORAL | 1 refills | Status: DC | PRN
  Filled 2022-12-17: qty 90, 90d supply, fill #0

## 2022-12-18 ENCOUNTER — Other Ambulatory Visit (HOSPITAL_COMMUNITY): Payer: Self-pay

## 2022-12-18 ENCOUNTER — Encounter (HOSPITAL_COMMUNITY): Payer: Self-pay

## 2022-12-20 ENCOUNTER — Other Ambulatory Visit: Payer: Self-pay

## 2022-12-23 ENCOUNTER — Other Ambulatory Visit: Payer: Self-pay

## 2022-12-23 ENCOUNTER — Ambulatory Visit: Payer: Medicare Other | Attending: Neurosurgery

## 2022-12-23 DIAGNOSIS — M5416 Radiculopathy, lumbar region: Secondary | ICD-10-CM | POA: Diagnosis present
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
## 2022-12-25 ENCOUNTER — Ambulatory Visit: Payer: Medicare Other

## 2022-12-25 DIAGNOSIS — M5416 Radiculopathy, lumbar region: Secondary | ICD-10-CM
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
## 2022-12-26 ENCOUNTER — Ambulatory Visit (INDEPENDENT_AMBULATORY_CARE_PROVIDER_SITE_OTHER): Payer: Medicare Other | Admitting: Cardiology

## 2022-12-26 ENCOUNTER — Encounter (HOSPITAL_BASED_OUTPATIENT_CLINIC_OR_DEPARTMENT_OTHER): Payer: Self-pay | Admitting: Cardiology

## 2022-12-26 VITALS — BP 130/82 | HR 76 | Ht 59.5 in | Wt 109.0 lb

## 2022-12-26 DIAGNOSIS — I350 Nonrheumatic aortic (valve) stenosis: Secondary | ICD-10-CM | POA: Diagnosis not present

## 2022-12-26 DIAGNOSIS — I10 Essential (primary) hypertension: Secondary | ICD-10-CM

## 2022-12-26 DIAGNOSIS — Z952 Presence of prosthetic heart valve: Secondary | ICD-10-CM

## 2022-12-26 DIAGNOSIS — I5032 Chronic diastolic (congestive) heart failure: Secondary | ICD-10-CM

## 2022-12-26 DIAGNOSIS — Z720 Tobacco use: Secondary | ICD-10-CM

## 2022-12-26 NOTE — Patient Instructions (Signed)
Medication Instructions:  Your physician recommends that you continue on your current medications as directed. Please refer to the Current Medication list given to you today.   *If you need a refill on your cardiac medications before your next appointment, please call your pharmacy*  Lab Work: NONE  Testing/Procedures: NONE   Follow-Up: At [facility], you and your health needs are our priority.  As part of our continuing mission to provide you with exceptional heart care, we have created designated Provider Care Teams.  These Care Teams include your primary Cardiologist (physician) and Advanced Practice Providers (APPs -  Physician Assistants and Nurse Practitioners) who all work together to provide you with the care you need, when you need it.  We recommend signing up for the patient portal called "MyChart".  Sign up information is provided on this After Visit Summary.  MyChart is used to connect with patients for Virtual Visits (Telemedicine).  Patients are able to view lab/test results, encounter notes, upcoming appointments, etc.  Non-urgent messages can be sent to your provider as well.   To learn more about what you can do with MyChart, go to [email or web address].    Your next appointment:   6 month(s)  The format for your next appointment:   In Person  Provider:   Bridgette Christopher, MD             
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
## 2022-12-30 ENCOUNTER — Ambulatory Visit: Payer: Medicare Other | Attending: Neurosurgery

## 2022-12-30 ENCOUNTER — Other Ambulatory Visit (HOSPITAL_COMMUNITY): Payer: Self-pay

## 2022-12-30 ENCOUNTER — Encounter (HOSPITAL_BASED_OUTPATIENT_CLINIC_OR_DEPARTMENT_OTHER): Payer: Self-pay | Admitting: Obstetrics & Gynecology

## 2022-12-30 ENCOUNTER — Other Ambulatory Visit (HOSPITAL_BASED_OUTPATIENT_CLINIC_OR_DEPARTMENT_OTHER): Payer: Self-pay | Admitting: *Deleted

## 2022-12-30 DIAGNOSIS — A609 Anogenital herpesviral infection, unspecified: Secondary | ICD-10-CM

## 2022-12-30 DIAGNOSIS — M5416 Radiculopathy, lumbar region: Secondary | ICD-10-CM | POA: Diagnosis present

## 2022-12-30 MED ORDER — ACYCLOVIR 400 MG PO TABS: 400.0000 mg | ORAL_TABLET | Freq: Two times a day (BID) | ORAL | 3 refills | Status: DC
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
## 2023-01-02 ENCOUNTER — Ambulatory Visit: Payer: Medicare Other

## 2023-01-02 DIAGNOSIS — M5416 Radiculopathy, lumbar region: Secondary | ICD-10-CM
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
## 2023-01-06 ENCOUNTER — Ambulatory Visit: Payer: Medicare Other

## 2023-01-06 DIAGNOSIS — M5416 Radiculopathy, lumbar region: Secondary | ICD-10-CM
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
## 2023-01-09 ENCOUNTER — Ambulatory Visit: Payer: Medicare Other

## 2023-01-09 DIAGNOSIS — M5416 Radiculopathy, lumbar region: Secondary | ICD-10-CM
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
## 2023-01-13 ENCOUNTER — Encounter: Payer: Self-pay | Admitting: *Deleted

## 2023-01-28 ENCOUNTER — Ambulatory Visit (HOSPITAL_BASED_OUTPATIENT_CLINIC_OR_DEPARTMENT_OTHER): Payer: Medicare Other | Admitting: Advanced Practice Midwife

## 2023-01-28 ENCOUNTER — Encounter (HOSPITAL_BASED_OUTPATIENT_CLINIC_OR_DEPARTMENT_OTHER): Payer: Self-pay | Admitting: Obstetrics & Gynecology

## 2023-01-28 ENCOUNTER — Ambulatory Visit (INDEPENDENT_AMBULATORY_CARE_PROVIDER_SITE_OTHER): Payer: Medicare Other | Admitting: Obstetrics & Gynecology

## 2023-01-28 VITALS — BP 126/72 | HR 64 | Ht 61.5 in | Wt 108.0 lb

## 2023-01-28 DIAGNOSIS — G8929 Other chronic pain: Secondary | ICD-10-CM

## 2023-01-28 DIAGNOSIS — M545 Low back pain, unspecified: Secondary | ICD-10-CM

## 2023-01-28 DIAGNOSIS — R102 Pelvic and perineal pain: Secondary | ICD-10-CM | POA: Diagnosis not present

## 2023-01-28 DIAGNOSIS — Z90710 Acquired absence of both cervix and uterus: Secondary | ICD-10-CM
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
## 2023-01-29 ENCOUNTER — Encounter (HOSPITAL_BASED_OUTPATIENT_CLINIC_OR_DEPARTMENT_OTHER): Payer: Self-pay | Admitting: Cardiology

## 2023-02-06 ENCOUNTER — Encounter (HOSPITAL_BASED_OUTPATIENT_CLINIC_OR_DEPARTMENT_OTHER): Payer: Self-pay

## 2023-02-07 NOTE — Telephone Encounter (Signed)
Sorry to route this to you all, but it looks like you all have seen the patient so I was not sure who exactly to reachout to. Thank you!

## 2023-02-12 ENCOUNTER — Other Ambulatory Visit (HOSPITAL_COMMUNITY): Payer: Self-pay | Admitting: Obstetrics & Gynecology

## 2023-02-12 ENCOUNTER — Other Ambulatory Visit (HOSPITAL_COMMUNITY): Payer: Self-pay | Admitting: Physical Medicine and Rehabilitation

## 2023-02-12 ENCOUNTER — Encounter (HOSPITAL_BASED_OUTPATIENT_CLINIC_OR_DEPARTMENT_OTHER): Payer: Self-pay | Admitting: Obstetrics & Gynecology

## 2023-02-12 DIAGNOSIS — Z1231 Encounter for screening mammogram for malignant neoplasm of breast: Secondary | ICD-10-CM

## 2023-02-12 NOTE — Telephone Encounter (Signed)
Forwarded patient's message regarding "no syncopal episodes" to Dr. Harrell Gave. Georgana Curio MHA RN CCM

## 2023-03-03 ENCOUNTER — Encounter (HOSPITAL_BASED_OUTPATIENT_CLINIC_OR_DEPARTMENT_OTHER): Payer: Self-pay

## 2023-03-04 ENCOUNTER — Encounter (HOSPITAL_BASED_OUTPATIENT_CLINIC_OR_DEPARTMENT_OTHER): Payer: Self-pay | Admitting: Cardiology

## 2023-03-04 ENCOUNTER — Encounter (HOSPITAL_BASED_OUTPATIENT_CLINIC_OR_DEPARTMENT_OTHER): Payer: Self-pay

## 2023-03-04 NOTE — Telephone Encounter (Signed)
What would you like letter to say ?  

## 2023-03-05 ENCOUNTER — Encounter (HOSPITAL_BASED_OUTPATIENT_CLINIC_OR_DEPARTMENT_OTHER): Payer: Self-pay

## 2023-03-19 ENCOUNTER — Ambulatory Visit (HOSPITAL_COMMUNITY): Payer: Medicare Other

## 2023-03-24 ENCOUNTER — Ambulatory Visit (HOSPITAL_COMMUNITY): Payer: Medicare Other

## 2023-04-03 ENCOUNTER — Encounter (HOSPITAL_COMMUNITY): Payer: Self-pay

## 2023-04-03 ENCOUNTER — Ambulatory Visit (HOSPITAL_COMMUNITY)
Admission: RE | Admit: 2023-04-03 | Discharge: 2023-04-03 | Disposition: A | Discharge status: Home / Self Care | Payer: Medicare Other | Source: Ambulatory Visit | Attending: Obstetrics & Gynecology | Admitting: Obstetrics & Gynecology

## 2023-04-03 DIAGNOSIS — Z1231 Encounter for screening mammogram for malignant neoplasm of breast: Secondary | ICD-10-CM | POA: Insufficient documentation

## 2023-04-17 ENCOUNTER — Encounter (HOSPITAL_BASED_OUTPATIENT_CLINIC_OR_DEPARTMENT_OTHER): Payer: Self-pay | Admitting: Obstetrics & Gynecology

## 2023-05-26 ENCOUNTER — Other Ambulatory Visit: Payer: Self-pay | Admitting: Nurse Practitioner

## 2023-06-27 ENCOUNTER — Ambulatory Visit (INDEPENDENT_AMBULATORY_CARE_PROVIDER_SITE_OTHER): Payer: Medicare Other | Admitting: Cardiology

## 2023-06-27 ENCOUNTER — Encounter (HOSPITAL_BASED_OUTPATIENT_CLINIC_OR_DEPARTMENT_OTHER): Payer: Self-pay | Admitting: Cardiology

## 2023-06-27 VITALS — BP 130/72 | HR 52 | Ht 61.5 in | Wt 106.3 lb

## 2023-06-27 DIAGNOSIS — Z952 Presence of prosthetic heart valve: Secondary | ICD-10-CM | POA: Diagnosis not present

## 2023-06-27 DIAGNOSIS — I5032 Chronic diastolic (congestive) heart failure: Secondary | ICD-10-CM

## 2023-06-27 DIAGNOSIS — I10 Essential (primary) hypertension: Secondary | ICD-10-CM | POA: Diagnosis not present

## 2023-06-27 DIAGNOSIS — I350 Nonrheumatic aortic (valve) stenosis: Secondary | ICD-10-CM

## 2023-06-27 DIAGNOSIS — Z720 Tobacco use: Secondary | ICD-10-CM

## 2023-06-27 NOTE — Patient Instructions (Signed)
Medication Instructions:  Continue current medications  *If you need a refill on your cardiac medications before your next appointment, please call your pharmacy*   Lab Work: None Ordered   Testing/Procedures: None Ordered   Follow-Up: At [facility], you and your health needs are our priority.  As part of our continuing mission to provide you with exceptional heart care, we have created designated Provider Care Teams.  These Care Teams include your primary Cardiologist (physician) and Advanced Practice Providers (APPs -  Physician Assistants and Nurse Practitioners) who all work together to provide you with the care you need, when you need it.  We recommend signing up for the patient portal called "MyChart".  Sign up information is provided on this After Visit Summary.  MyChart is used to connect with patients for Virtual Visits (Telemedicine).  Patients are able to view lab/test results, encounter notes, upcoming appointments, etc.  Non-urgent messages can be sent to your provider as well.   To learn more about what you can do with MyChart, go to [email or web address].    Your next appointment:   1 year(s)  Provider:   Bridgette Christopher, MD    Other Instructions   
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
## 2023-07-21 ENCOUNTER — Other Ambulatory Visit (HOSPITAL_COMMUNITY): Payer: Self-pay

## 2023-08-13 ENCOUNTER — Encounter (HOSPITAL_BASED_OUTPATIENT_CLINIC_OR_DEPARTMENT_OTHER): Payer: Self-pay | Admitting: Cardiology

## 2023-09-08 ENCOUNTER — Encounter (HOSPITAL_BASED_OUTPATIENT_CLINIC_OR_DEPARTMENT_OTHER): Payer: Self-pay | Admitting: Obstetrics & Gynecology

## 2023-09-08 ENCOUNTER — Other Ambulatory Visit (HOSPITAL_BASED_OUTPATIENT_CLINIC_OR_DEPARTMENT_OTHER): Payer: Self-pay | Admitting: Obstetrics & Gynecology

## 2023-09-08 DIAGNOSIS — Z780 Asymptomatic menopausal state: Secondary | ICD-10-CM

## 2023-09-08 DIAGNOSIS — E2839 Other primary ovarian failure: Secondary | ICD-10-CM

## 2023-09-09 ENCOUNTER — Other Ambulatory Visit: Payer: Self-pay | Admitting: Neurosurgery

## 2023-09-22 ENCOUNTER — Ambulatory Visit (HOSPITAL_BASED_OUTPATIENT_CLINIC_OR_DEPARTMENT_OTHER)
Admission: RE | Admit: 2023-09-22 | Discharge: 2023-09-22 | Disposition: A | Discharge status: Home / Self Care | Payer: Medicare Other | Source: Ambulatory Visit | Attending: Obstetrics & Gynecology | Admitting: Obstetrics & Gynecology

## 2023-09-22 DIAGNOSIS — Z780 Asymptomatic menopausal state: Secondary | ICD-10-CM | POA: Diagnosis present

## 2023-09-22 DIAGNOSIS — E2839 Other primary ovarian failure: Secondary | ICD-10-CM | POA: Insufficient documentation

## 2023-09-26 NOTE — Pre-Procedure Instructions (Signed)
Surgical Instructions   Your procedure is scheduled on October 06, 2023. Report to [facility] Main Entrance "A" at 9:40 A.M., then check in with the Admitting office. Any questions or running late day of surgery: call [phone]  Questions prior to your surgery date: call [phone], Monday-Friday, 8am-4pm. If you experience any cold or flu symptoms such as cough, fever, chills, shortness of breath, etc. between now and your scheduled surgery, please notify us at the above number.     Remember:  Do not eat or drink after midnight the night before your surgery    Take these medicines the morning of surgery with A SIP OF WATER: cycloSPORINE (RESTASIS) ophthalmic emulsion  DULoxetine (CYMBALTA)  gabapentin (NEURONTIN)  methocarbamol (ROBAXIN)  metoprolol tartrate (LOPRESSOR)  montelukast (SINGULAIR)  pantoprazole (PROTONIX)    May take these medicines IF NEEDED: acetaminophen (TYLENOL)  acyclovir (ZOVIRAX)  EPINEPHrine Pen fluticasone (FLONASE) nasal spray PERCOCET     Follow your surgeon's instructions on when to stop Asprin.  If no instructions were given by your surgeon then you will need to call the office to get those instructions.     One week prior to surgery, STOP taking any Aleve, Naproxen, Ibuprofen, Motrin, Advil, Goody's, BC's, all herbal medications, fish oil, and non-prescription vitamins. This includes your medication: meloxicam (MOBIC)                      Do NOT Smoke (Tobacco/Vaping) for 24 hours prior to your procedure.  If you use a CPAP at night, you may bring your mask/headgear for your overnight stay.   You will be asked to remove any contacts, glasses, piercing's, hearing aid's, dentures/partials prior to surgery. Please bring cases for these items if needed.    Patients discharged the day of surgery will not be allowed to drive home, and someone needs to stay with them for 24 hours.  SURGICAL WAITING ROOM VISITATION Patients may have no more  than 2 support people in the waiting area - these visitors may rotate.   Pre-op nurse will coordinate an appropriate time for 1 ADULT support person, who may not rotate, to accompany patient in pre-op.  Children under the age of 33 must have an adult with them who is not the patient and must remain in the main waiting area with an adult.  If the patient needs to stay at the hospital during part of their recovery, the visitor guidelines for inpatient rooms apply.  Please refer to the [facility] website for the visitor guidelines for any additional information.   If you received a COVID test during your pre-op visit  it is requested that you wear a mask when out in public, stay away from anyone that may not be feeling well and notify your surgeon if you develop symptoms. If you have been in contact with anyone that has tested positive in the last 10 days please notify you surgeon.      Pre-operative 5 CHG Bathing Instructions   You can play a key role in reducing the risk of infection after surgery. Your skin needs to be as free of germs as possible. You can reduce the number of germs on your skin by washing with CHG (chlorhexidine gluconate) soap before surgery. CHG is an antiseptic soap that kills germs and continues to kill germs even after washing.   DO NOT use if you have an allergy to chlorhexidine/CHG or antibacterial soaps. If your skin becomes reddened or irritated, stop using  the CHG and notify one of our RNs at [phone].   Please shower with the CHG soap starting 4 days before surgery using the following schedule:     Please keep in mind the following:  DO NOT shave, including legs and underarms, starting the day of your first shower.   You may shave your face at any point before/day of surgery.  Place clean sheets on your bed the day you start using CHG soap. Use a clean washcloth (not used since being washed) for each shower. DO NOT sleep with pets once you start using  the CHG.   CHG Shower Instructions:  Wash your face and private area with normal soap. If you choose to wash your hair, wash first with your normal shampoo.  After you use shampoo/soap, rinse your hair and body thoroughly to remove shampoo/soap residue.  Turn the water OFF and apply about 3 tablespoons (45 ml) of CHG soap to a CLEAN washcloth.  Apply CHG soap ONLY FROM YOUR NECK DOWN TO YOUR TOES (washing for 3-5 minutes)  DO NOT use CHG soap on face, private areas, open wounds, or sores.  Pay special attention to the area where your surgery is being performed.  If you are having back surgery, having someone wash your back for you may be helpful. Wait 2 minutes after CHG soap is applied, then you may rinse off the CHG soap.  Pat dry with a clean towel  Put on clean clothes/pajamas   If you choose to wear lotion, please use ONLY the CHG-compatible lotions on the back of this paper.   Additional instructions for the day of surgery: DO NOT APPLY any lotions, deodorants, cologne, or perfumes.   Do not bring valuables to the hospital. [facility] is not responsible for any belongings/valuables. Do not wear nail polish, gel polish, artificial nails, or any other type of covering on natural nails (fingers and toes) Do not wear jewelry or makeup Put on clean/comfortable clothes.  Please brush your teeth.  Ask your nurse before applying any prescription medications to the skin.     CHG Compatible Lotions   Aveeno Moisturizing lotion  Cetaphil Moisturizing Cream  Cetaphil Moisturizing Lotion  Clairol Herbal Essence Moisturizing Lotion, Dry Skin  Clairol Herbal Essence Moisturizing Lotion, Extra Dry Skin  Clairol Herbal Essence Moisturizing Lotion, Normal Skin  Curel Age Defying Therapeutic Moisturizing Lotion with Alpha Hydroxy  Curel Extreme Care Body Lotion  Curel Soothing Hands Moisturizing Hand Lotion  Curel Therapeutic Moisturizing Cream, Fragrance-Free  Curel Therapeutic  Moisturizing Lotion, Fragrance-Free  Curel Therapeutic Moisturizing Lotion, Original Formula  Eucerin Daily Replenishing Lotion  Eucerin Dry Skin Therapy Plus Alpha Hydroxy Crme  Eucerin Dry Skin Therapy Plus Alpha Hydroxy Lotion  Eucerin Original Crme  Eucerin Original Lotion  Eucerin Plus Crme Eucerin Plus Lotion  Eucerin TriLipid Replenishing Lotion  Keri Anti-Bacterial Hand Lotion  Keri Deep Conditioning Original Lotion Dry Skin Formula Softly Scented  Keri Deep Conditioning Original Lotion, Fragrance Free Sensitive Skin Formula  Keri Lotion Fast Absorbing Fragrance Free Sensitive Skin Formula  Keri Lotion Fast Absorbing Softly Scented Dry Skin Formula  Keri Original Lotion  Keri Skin Renewal Lotion Keri Silky Smooth Lotion  Keri Silky Smooth Sensitive Skin Lotion  Nivea Body Creamy Conditioning Oil  Nivea Body Extra Enriched Teacher, adult education Moisturizing Lotion Nivea Crme  Nivea Skin Firming Lotion  NutraDerm 30 Skin Lotion  NutraDerm Skin Lotion  NutraDerm Therapeutic Skin Cream  NutraDerm  Therapeutic Skin Lotion  ProShield Protective Hand Cream  Provon moisturizing lotion  Please read over the following fact sheets that you were given.

## 2023-09-29 ENCOUNTER — Other Ambulatory Visit: Payer: Self-pay | Admitting: Neurosurgery

## 2023-09-29 ENCOUNTER — Encounter (HOSPITAL_COMMUNITY)
Admission: RE | Admit: 2023-09-29 | Discharge: 2023-09-29 | Disposition: A | Discharge status: Home / Self Care | Payer: Medicare Other | Source: Ambulatory Visit | Attending: Neurosurgery | Admitting: Neurosurgery

## 2023-09-29 ENCOUNTER — Encounter (HOSPITAL_COMMUNITY): Payer: Self-pay

## 2023-09-29 ENCOUNTER — Other Ambulatory Visit: Payer: Self-pay

## 2023-09-29 VITALS — BP 153/77 | HR 50 | Temp 97.8°F | Resp 16 | Ht 59.5 in | Wt 109.3 lb

## 2023-09-29 DIAGNOSIS — Z87891 Personal history of nicotine dependence: Secondary | ICD-10-CM | POA: Insufficient documentation

## 2023-09-29 DIAGNOSIS — E039 Hypothyroidism, unspecified: Secondary | ICD-10-CM | POA: Diagnosis not present

## 2023-09-29 DIAGNOSIS — Z8673 Personal history of transient ischemic attack (TIA), and cerebral infarction without residual deficits: Secondary | ICD-10-CM | POA: Insufficient documentation

## 2023-09-29 DIAGNOSIS — J432 Centrilobular emphysema: Secondary | ICD-10-CM | POA: Diagnosis not present

## 2023-09-29 DIAGNOSIS — Z01818 Encounter for other preprocedural examination: Secondary | ICD-10-CM

## 2023-09-29 DIAGNOSIS — M4319 Spondylolisthesis, multiple sites in spine: Secondary | ICD-10-CM | POA: Insufficient documentation

## 2023-09-29 DIAGNOSIS — I10 Essential (primary) hypertension: Secondary | ICD-10-CM | POA: Diagnosis not present

## 2023-09-29 DIAGNOSIS — F32A Depression, unspecified: Secondary | ICD-10-CM | POA: Insufficient documentation

## 2023-09-29 DIAGNOSIS — Z01812 Encounter for preprocedural laboratory examination: Secondary | ICD-10-CM | POA: Diagnosis present

## 2023-09-29 DIAGNOSIS — F419 Anxiety disorder, unspecified: Secondary | ICD-10-CM | POA: Diagnosis not present

## 2023-09-29 DIAGNOSIS — K219 Gastro-esophageal reflux disease without esophagitis: Secondary | ICD-10-CM | POA: Insufficient documentation

## 2023-09-29 HISTORY — DX: Chronic obstructive pulmonary disease, unspecified: J44.9

## 2023-09-29 LAB — BASIC METABOLIC PANEL
Anion gap: 8 (ref 5–15)
BUN: 12 mg/dL (ref 8–23)
CO2: 28 mmol/L (ref 22–32)
Calcium: 9.9 mg/dL (ref 8.9–10.3)
Chloride: 98 mmol/L (ref 98–111)
Creatinine, Ser: 0.95 mg/dL (ref 0.44–1.00)
GFR, Estimated: >60 mL/min (ref >60)
Glucose, Bld: 89 mg/dL (ref 70–99)
Potassium: 4.6 mmol/L (ref 3.5–5.1)
Sodium: 134 mmol/L — ABNORMAL LOW (ref 135–145)

## 2023-09-29 LAB — CBC
HCT: 39.4 % (ref 36.0–46.0)
Hemoglobin: 13.2 g/dL (ref 12.0–15.0)
MCH: 33.3 pg (ref 26.0–34.0)
MCHC: 33.5 g/dL (ref 30.0–36.0)
MCV: 99.5 fL (ref 80.0–100.0)
Platelets: 230 10*3/uL (ref 150–400)
RBC: 3.96 MIL/uL (ref 3.87–5.11)
RDW: 12.5 % (ref 11.5–15.5)
WBC: 6.3 10*3/uL (ref 4.0–10.5)
nRBC: 0 % (ref 0.0–0.2)

## 2023-09-29 LAB — SURGICAL PCR SCREEN
MRSA, PCR: NEGATIVE
Staphylococcus aureus: POSITIVE — AB

## 2023-09-29 LAB — TYPE AND SCREEN
ABO/RH(D): A POS
Antibody Screen: NEGATIVE

## 2023-09-29 NOTE — Progress Notes (Addendum)
PCP - Lenox Ahr PA-C  Cardiologist - Jodelle Red, MD   LAST OFFICE VISIT 06-27-23  PPM/ICD - denies Device Orders - n/a Rep Notified - n/a  Chest x-ray -  EKG - 07-03-23 Stress Test -  ECHO - 03-01-22 Cardiac Cath - 12-21-21  Sleep Study - denies CPAP - n/a  DM - denies  Blood Thinner Instructions: Aspirin Instructions:aspirin EC 81 LAST DOSE 09-30-23  ERAS Protcol - NPO  COVID TEST- n/a   Anesthesia review: Yes HTN, CVA  Patient denies shortness of breath, fever, cough and chest pain at PAT appointment   All instructions explained to the patient, with a verbal understanding of the material. Patient agrees to go over the instructions while at home for a better understanding. Patient also instructed to self quarantine after being tested for COVID-19. The opportunity to ask questions was provided.

## 2023-09-30 NOTE — Anesthesia Preprocedure Evaluation (Addendum)
Anesthesia Evaluation  Patient identified by MRN, date of birth, ID band Patient awake    Reviewed: Allergy & Precautions, H&P , NPO status , Patient's Chart, lab work & pertinent test results  History of Anesthesia Complications (+) PONV, Family history of anesthesia reaction and history of anesthetic complications  Airway Mallampati: III  TM Distance: >3 FB Neck ROM: Full    Dental no notable dental hx. (+) Partial Lower, Partial Upper, Dental Advisory Given,    Pulmonary neg pulmonary ROS, former smoker   Pulmonary exam normal breath sounds clear to auscultation       Cardiovascular Exercise Tolerance: Good hypertension, Pt. on medications and Pt. on home beta blockers  Rhythm:Regular Rate:Normal     Neuro/Psych   Anxiety Depression    CVA    GI/Hepatic Neg liver ROS,GERD  Medicated,,  Endo/Other  Hypothyroidism    Renal/GU negative Renal ROS  negative genitourinary   Musculoskeletal  (+) Arthritis , Osteoarthritis,  Fibromyalgia -  Abdominal   Peds  Hematology negative hematology ROS (+)   Anesthesia Other Findings   Reproductive/Obstetrics negative OB ROS                             Anesthesia Physical Anesthesia Plan  ASA: 3  Anesthesia Plan: General   Post-op Pain Management: Dilaudid IV   Induction: Intravenous  PONV Risk Score and Plan: 4 or greater and Ondansetron, Dexamethasone, Midazolam, Droperidol and Treatment may vary due to age or medical condition  Airway Management Planned: Oral ETT  Additional Equipment:   Intra-op Plan:   Post-operative Plan: Extubation in OR  Informed Consent: I have reviewed the patients History and Physical, chart, labs and discussed the procedure including the risks, benefits and alternatives for the proposed anesthesia with the patient or authorized representative who has indicated his/her understanding and acceptance.      Dental advisory given  Plan Discussed with: CRNA  Anesthesia Plan Comments: (PAT note written 09/30/2023 by Shonna Chock, PA-C.  )        Anesthesia Quick Evaluation
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
## 2023-10-01 ENCOUNTER — Encounter (HOSPITAL_BASED_OUTPATIENT_CLINIC_OR_DEPARTMENT_OTHER): Payer: Self-pay

## 2023-10-06 ENCOUNTER — Encounter (HOSPITAL_COMMUNITY): Payer: Self-pay | Admitting: Neurosurgery

## 2023-10-06 ENCOUNTER — Ambulatory Visit (HOSPITAL_COMMUNITY): Payer: Medicare Other | Admitting: Vascular Surgery

## 2023-10-06 ENCOUNTER — Other Ambulatory Visit (HOSPITAL_COMMUNITY): Payer: Self-pay | Admitting: Neurosurgery

## 2023-10-06 ENCOUNTER — Other Ambulatory Visit: Payer: Self-pay

## 2023-10-06 ENCOUNTER — Ambulatory Visit (HOSPITAL_COMMUNITY)
Admission: RE | Admit: 2023-10-06 | Discharge: 2023-10-07 | Disposition: A | Discharge status: Home / Self Care | Payer: Medicare Other | Attending: Neurosurgery | Admitting: Neurosurgery

## 2023-10-06 ENCOUNTER — Ambulatory Visit (HOSPITAL_BASED_OUTPATIENT_CLINIC_OR_DEPARTMENT_OTHER): Payer: Medicare Other | Admitting: Anesthesiology

## 2023-10-06 ENCOUNTER — Encounter (HOSPITAL_COMMUNITY)
Admission: RE | Disposition: A | Discharge status: Home / Self Care | Payer: Self-pay | Source: Home / Self Care | Attending: Neurosurgery

## 2023-10-06 ENCOUNTER — Ambulatory Visit (HOSPITAL_COMMUNITY): Payer: Medicare Other

## 2023-10-06 ENCOUNTER — Ambulatory Visit (HOSPITAL_COMMUNITY)
Admission: RE | Admit: 2023-10-06 | Discharge: 2023-10-06 | Disposition: A | Discharge status: Home / Self Care | Payer: Medicare Other | Source: Ambulatory Visit | Attending: Neurosurgery | Admitting: Neurosurgery

## 2023-10-06 DIAGNOSIS — F32A Depression, unspecified: Secondary | ICD-10-CM | POA: Insufficient documentation

## 2023-10-06 DIAGNOSIS — M4316 Spondylolisthesis, lumbar region: Secondary | ICD-10-CM | POA: Insufficient documentation

## 2023-10-06 DIAGNOSIS — M48062 Spinal stenosis, lumbar region with neurogenic claudication: Secondary | ICD-10-CM | POA: Diagnosis not present

## 2023-10-06 DIAGNOSIS — F419 Anxiety disorder, unspecified: Secondary | ICD-10-CM | POA: Diagnosis not present

## 2023-10-06 DIAGNOSIS — M5116 Intervertebral disc disorders with radiculopathy, lumbar region: Secondary | ICD-10-CM | POA: Insufficient documentation

## 2023-10-06 DIAGNOSIS — I10 Essential (primary) hypertension: Secondary | ICD-10-CM | POA: Insufficient documentation

## 2023-10-06 DIAGNOSIS — Z87891 Personal history of nicotine dependence: Secondary | ICD-10-CM | POA: Insufficient documentation

## 2023-10-06 DIAGNOSIS — Z419 Encounter for procedure for purposes other than remedying health state, unspecified: Secondary | ICD-10-CM

## 2023-10-06 DIAGNOSIS — Z8673 Personal history of transient ischemic attack (TIA), and cerebral infarction without residual deficits: Secondary | ICD-10-CM | POA: Diagnosis not present

## 2023-10-06 DIAGNOSIS — M797 Fibromyalgia: Secondary | ICD-10-CM | POA: Insufficient documentation

## 2023-10-06 DIAGNOSIS — Z79899 Other long term (current) drug therapy: Secondary | ICD-10-CM | POA: Diagnosis not present

## 2023-10-06 DIAGNOSIS — K219 Gastro-esophageal reflux disease without esophagitis: Secondary | ICD-10-CM | POA: Diagnosis not present

## 2023-10-06 SURGERY — POSTERIOR LUMBAR FUSION 1 LEVEL
Anesthesia: General

## 2023-10-06 MED ORDER — SODIUM CHLORIDE 0.9% FLUSH
3.0000 mL | Freq: Two times a day (BID) | INTRAVENOUS | Status: DC
  Administered 2023-10-06: 3 mL via INTRAVENOUS

## 2023-10-06 MED ORDER — PROPOFOL 10 MG/ML IV BOLUS
INTRAVENOUS | Status: DC | PRN
  Administered 2023-10-06: 110 mg via INTRAVENOUS

## 2023-10-06 MED ORDER — OXYCODONE HCL 5 MG PO TABS: 5.0000 mg | ORAL_TABLET | Freq: Once | ORAL | Status: DC | PRN

## 2023-10-06 MED ORDER — BUPIVACAINE-EPINEPHRINE (PF) 0.5% -1:200000 IJ SOLN
INTRAMUSCULAR | Status: AC
  Filled 2023-10-06: qty 30

## 2023-10-06 MED ORDER — PHENYLEPHRINE 80 MCG/ML (10ML) SYRINGE FOR IV PUSH (FOR BLOOD PRESSURE SUPPORT)
PREFILLED_SYRINGE | INTRAVENOUS | Status: DC | PRN
  Administered 2023-10-06 (×2): 80 ug via INTRAVENOUS
  Administered 2023-10-06: 160 ug via INTRAVENOUS

## 2023-10-06 MED ORDER — CHLORHEXIDINE GLUCONATE CLOTH 2 % EX PADS
6.0000 | MEDICATED_PAD | Freq: Once | CUTANEOUS | Status: DC
Start: 2023-10-06 — End: 2023-10-06

## 2023-10-06 MED ORDER — VASHE WOUND IRRIGATION OPTIME
TOPICAL | Status: DC | PRN
  Administered 2023-10-06: 34 [oz_av] via TOPICAL

## 2023-10-06 MED ORDER — THROMBIN 5000 UNITS EX SOLR
CUTANEOUS | Status: AC
  Filled 2023-10-06: qty 5000

## 2023-10-06 MED ORDER — AMISULPRIDE (ANTIEMETIC) 5 MG/2ML IV SOLN: 10.0000 mg | Freq: Once | INTRAVENOUS | Status: DC | PRN

## 2023-10-06 MED ORDER — MENTHOL 3 MG MT LOZG: 1.0000 | LOZENGE | OROMUCOSAL | Status: DC | PRN

## 2023-10-06 MED ORDER — PHENYLEPHRINE HCL (PRESSORS) 10 MG/ML IV SOLN: INTRAVENOUS | Status: DC | PRN

## 2023-10-06 MED ORDER — GABAPENTIN 100 MG PO CAPS
100.0000 mg | ORAL_CAPSULE | Freq: Three times a day (TID) | ORAL | Status: DC
  Administered 2023-10-06 – 2023-10-07 (×2): 100 mg via ORAL
  Filled 2023-10-06 (×2): qty 1

## 2023-10-06 MED ORDER — LIDOCAINE 2% (20 MG/ML) 5 ML SYRINGE
INTRAMUSCULAR | Status: DC | PRN
  Administered 2023-10-06: 30 mg via INTRAVENOUS

## 2023-10-06 MED ORDER — HYDROMORPHONE HCL 1 MG/ML IJ SOLN
0.2500 mg | INTRAMUSCULAR | Status: DC | PRN
  Administered 2023-10-06 (×2): 0.5 mg via INTRAVENOUS

## 2023-10-06 MED ORDER — DULOXETINE HCL 30 MG PO CPEP
60.0000 mg | ORAL_CAPSULE | Freq: Every day | ORAL | Status: DC
  Administered 2023-10-07: 60 mg via ORAL
  Filled 2023-10-06: qty 2

## 2023-10-06 MED ORDER — METHOCARBAMOL 500 MG PO TABS
ORAL_TABLET | ORAL | Status: AC
  Filled 2023-10-06: qty 1

## 2023-10-06 MED ORDER — BACITRACIN ZINC 500 UNIT/GM EX OINT
TOPICAL_OINTMENT | CUTANEOUS | Status: DC | PRN
  Administered 2023-10-06: 1 via TOPICAL

## 2023-10-06 MED ORDER — PHENYLEPHRINE 80 MCG/ML (10ML) SYRINGE FOR IV PUSH (FOR BLOOD PRESSURE SUPPORT)
PREFILLED_SYRINGE | INTRAVENOUS | Status: AC
  Filled 2023-10-06: qty 10

## 2023-10-06 MED ORDER — MIDAZOLAM HCL 2 MG/2ML IJ SOLN
INTRAMUSCULAR | Status: AC
  Filled 2023-10-06: qty 2

## 2023-10-06 MED ORDER — CEFAZOLIN SODIUM-DEXTROSE 2-4 GM/100ML-% IV SOLN
2.0000 g | Freq: Three times a day (TID) | INTRAVENOUS | Status: AC
Start: 2023-10-06 — End: 2023-10-07
  Administered 2023-10-06 – 2023-10-07 (×2): 2 g via INTRAVENOUS
  Filled 2023-10-06 (×2): qty 100

## 2023-10-06 MED ORDER — FENTANYL CITRATE (PF) 250 MCG/5ML IJ SOLN
INTRAMUSCULAR | Status: DC | PRN
  Administered 2023-10-06: 25 ug via INTRAVENOUS
  Administered 2023-10-06 (×2): 50 ug via INTRAVENOUS
  Administered 2023-10-06: 25 ug via INTRAVENOUS

## 2023-10-06 MED ORDER — BACITRACIN ZINC 500 UNIT/GM EX OINT
TOPICAL_OINTMENT | CUTANEOUS | Status: AC
  Filled 2023-10-06: qty 28.35

## 2023-10-06 MED ORDER — ONDANSETRON HCL 4 MG/2ML IJ SOLN
INTRAMUSCULAR | Status: AC
  Filled 2023-10-06: qty 2

## 2023-10-06 MED ORDER — SODIUM CHLORIDE 0.9% FLUSH: 3.0000 mL | INTRAVENOUS | Status: DC | PRN

## 2023-10-06 MED ORDER — OXYCODONE-ACETAMINOPHEN 10-325 MG PO TABS: 1.0000 | ORAL_TABLET | ORAL | Status: DC | PRN

## 2023-10-06 MED ORDER — LACTATED RINGERS IV SOLN: INTRAVENOUS | Status: AC

## 2023-10-06 MED ORDER — OXYCODONE HCL 5 MG PO TABS
5.0000 mg | ORAL_TABLET | ORAL | Status: DC | PRN
  Filled 2023-10-06: qty 1

## 2023-10-06 MED ORDER — OXYCODONE HCL 5 MG PO TABS
ORAL_TABLET | ORAL | Status: AC
  Filled 2023-10-06: qty 1

## 2023-10-06 MED ORDER — EPHEDRINE SULFATE-NACL 50-0.9 MG/10ML-% IV SOSY
PREFILLED_SYRINGE | INTRAVENOUS | Status: DC | PRN
  Administered 2023-10-06: 5 mg via INTRAVENOUS
  Administered 2023-10-06: 10 mg via INTRAVENOUS
  Administered 2023-10-06: 5 mg via INTRAVENOUS

## 2023-10-06 MED ORDER — THROMBIN 5000 UNITS EX SOLR
OROMUCOSAL | Status: DC | PRN
  Administered 2023-10-06: 5 mL via TOPICAL

## 2023-10-06 MED ORDER — DEXAMETHASONE SODIUM PHOSPHATE 10 MG/ML IJ SOLN
INTRAMUSCULAR | Status: DC | PRN
  Administered 2023-10-06: 10 mg via INTRAVENOUS

## 2023-10-06 MED ORDER — CHLORHEXIDINE GLUCONATE 0.12 % MT SOLN
15.0000 mL | Freq: Once | OROMUCOSAL | Status: AC
  Administered 2023-10-06: 15 mL via OROMUCOSAL
  Filled 2023-10-06: qty 15

## 2023-10-06 MED ORDER — DEXAMETHASONE SODIUM PHOSPHATE 10 MG/ML IJ SOLN
INTRAMUSCULAR | Status: AC
  Filled 2023-10-06: qty 1

## 2023-10-06 MED ORDER — ACETAMINOPHEN 325 MG PO TABS
650.0000 mg | ORAL_TABLET | ORAL | Status: DC | PRN
  Administered 2023-10-07: 650 mg via ORAL
  Filled 2023-10-06: qty 2

## 2023-10-06 MED ORDER — HEMOSTATIC AGENTS (NO CHARGE) OPTIME
TOPICAL | Status: DC | PRN
  Administered 2023-10-06: 1 via TOPICAL

## 2023-10-06 MED ORDER — ONDANSETRON HCL 4 MG/2ML IJ SOLN
4.0000 mg | Freq: Four times a day (QID) | INTRAMUSCULAR | Status: DC | PRN
  Administered 2023-10-06: 4 mg via INTRAVENOUS
  Filled 2023-10-06: qty 2

## 2023-10-06 MED ORDER — CYCLOSPORINE 0.05 % OP EMUL
1.0000 [drp] | Freq: Two times a day (BID) | OPHTHALMIC | Status: DC
  Administered 2023-10-06 – 2023-10-07 (×2): 1 [drp] via OPHTHALMIC
  Filled 2023-10-06 (×4): qty 30

## 2023-10-06 MED ORDER — SODIUM CHLORIDE 0.9 % IV SOLN: 250.0000 mL | INTRAVENOUS | Status: AC

## 2023-10-06 MED ORDER — MONTELUKAST SODIUM 10 MG PO TABS
10.0000 mg | ORAL_TABLET | Freq: Every day | ORAL | Status: DC
  Administered 2023-10-06: 10 mg via ORAL
  Filled 2023-10-06: qty 1

## 2023-10-06 MED ORDER — PHENYLEPHRINE HCL-NACL 20-0.9 MG/250ML-% IV SOLN
INTRAVENOUS | Status: DC | PRN
  Administered 2023-10-06: 50 ug/min via INTRAVENOUS

## 2023-10-06 MED ORDER — ONDANSETRON HCL 4 MG/2ML IJ SOLN
INTRAMUSCULAR | Status: DC | PRN
  Administered 2023-10-06: 4 mg via INTRAVENOUS

## 2023-10-06 MED ORDER — PROPOFOL 10 MG/ML IV BOLUS
INTRAVENOUS | Status: AC
  Filled 2023-10-06: qty 20

## 2023-10-06 MED ORDER — ONDANSETRON HCL 4 MG PO TABS
4.0000 mg | ORAL_TABLET | Freq: Four times a day (QID) | ORAL | Status: DC | PRN
Start: 2023-10-06 — End: 2023-10-07

## 2023-10-06 MED ORDER — BUPIVACAINE-EPINEPHRINE (PF) 0.5% -1:200000 IJ SOLN
INTRAMUSCULAR | Status: DC | PRN
  Administered 2023-10-06: 10 mL

## 2023-10-06 MED ORDER — LACTATED RINGERS IV SOLN: INTRAVENOUS | Status: DC | PRN

## 2023-10-06 MED ORDER — ORAL CARE MOUTH RINSE: 15.0000 mL | Freq: Once | OROMUCOSAL | Status: AC

## 2023-10-06 MED ORDER — MEPERIDINE HCL 25 MG/ML IJ SOLN
6.2500 mg | INTRAMUSCULAR | Status: DC | PRN
Start: 2023-10-06 — End: 2023-10-06

## 2023-10-06 MED ORDER — PANTOPRAZOLE SODIUM 40 MG PO TBEC
40.0000 mg | DELAYED_RELEASE_TABLET | Freq: Every day | ORAL | Status: DC
  Administered 2023-10-07: 40 mg via ORAL
  Filled 2023-10-06: qty 1

## 2023-10-06 MED ORDER — LIDOCAINE 2% (20 MG/ML) 5 ML SYRINGE
INTRAMUSCULAR | Status: AC
  Filled 2023-10-06: qty 5

## 2023-10-06 MED ORDER — FENTANYL CITRATE (PF) 250 MCG/5ML IJ SOLN
INTRAMUSCULAR | Status: AC
  Filled 2023-10-06: qty 5

## 2023-10-06 MED ORDER — HYDROMORPHONE HCL 1 MG/ML IJ SOLN
INTRAMUSCULAR | Status: AC
  Administered 2023-10-06: 0.5 mg via INTRAVENOUS
  Filled 2023-10-06: qty 1

## 2023-10-06 MED ORDER — ACETAMINOPHEN 650 MG RE SUPP: 650.0000 mg | RECTAL | Status: DC | PRN

## 2023-10-06 MED ORDER — MIDAZOLAM HCL 2 MG/2ML IJ SOLN
INTRAMUSCULAR | Status: DC | PRN
  Administered 2023-10-06: 2 mg via INTRAVENOUS

## 2023-10-06 MED ORDER — VANCOMYCIN HCL IN DEXTROSE 1-5 GM/200ML-% IV SOLN
1000.0000 mg | INTRAVENOUS | Status: AC
Start: 2023-10-06 — End: 2023-10-06
  Administered 2023-10-06: 1000 mg via INTRAVENOUS
  Filled 2023-10-06: qty 200

## 2023-10-06 MED ORDER — OXYCODONE HCL 5 MG/5ML PO SOLN: 5.0000 mg | Freq: Once | ORAL | Status: DC | PRN

## 2023-10-06 MED ORDER — METHOCARBAMOL 500 MG PO TABS
500.0000 mg | ORAL_TABLET | Freq: Four times a day (QID) | ORAL | Status: DC | PRN
  Administered 2023-10-06 – 2023-10-07 (×2): 500 mg via ORAL
  Filled 2023-10-06 (×3): qty 1

## 2023-10-06 MED ORDER — BUPIVACAINE LIPOSOME 1.3 % IJ SUSP
INTRAMUSCULAR | Status: AC
  Filled 2023-10-06: qty 20

## 2023-10-06 MED ORDER — SUGAMMADEX SODIUM 200 MG/2ML IV SOLN
INTRAVENOUS | Status: DC | PRN
  Administered 2023-10-06: 96 mg via INTRAVENOUS

## 2023-10-06 MED ORDER — ROCURONIUM BROMIDE 10 MG/ML (PF) SYRINGE
PREFILLED_SYRINGE | INTRAVENOUS | Status: AC
  Filled 2023-10-06: qty 10

## 2023-10-06 MED ORDER — BISACODYL 10 MG RE SUPP
10.0000 mg | Freq: Every day | RECTAL | Status: DC | PRN
Start: 2023-10-06 — End: 2023-10-07

## 2023-10-06 MED ORDER — FLUTICASONE PROPIONATE 50 MCG/ACT NA SUSP: 2.0000 | Freq: Every day | NASAL | Status: DC | PRN

## 2023-10-06 MED ORDER — LISINOPRIL 10 MG PO TABS
10.0000 mg | ORAL_TABLET | Freq: Every day | ORAL | Status: DC
  Administered 2023-10-06: 10 mg via ORAL
  Filled 2023-10-06: qty 1

## 2023-10-06 MED ORDER — ACETAMINOPHEN 500 MG PO TABS
1000.0000 mg | ORAL_TABLET | Freq: Four times a day (QID) | ORAL | Status: AC
  Administered 2023-10-06 – 2023-10-07 (×3): 1000 mg via ORAL
  Filled 2023-10-06 (×4): qty 2

## 2023-10-06 MED ORDER — DOCUSATE SODIUM 100 MG PO CAPS
100.0000 mg | ORAL_CAPSULE | Freq: Two times a day (BID) | ORAL | Status: DC
  Administered 2023-10-06 – 2023-10-07 (×2): 100 mg via ORAL
  Filled 2023-10-06 (×2): qty 1

## 2023-10-06 MED ORDER — 0.9 % SODIUM CHLORIDE (POUR BTL) OPTIME
TOPICAL | Status: DC | PRN
  Administered 2023-10-06: 1000 mL

## 2023-10-06 MED ORDER — ROCURONIUM BROMIDE 10 MG/ML (PF) SYRINGE
PREFILLED_SYRINGE | INTRAVENOUS | Status: DC | PRN
  Administered 2023-10-06: 50 mg via INTRAVENOUS
  Administered 2023-10-06: 20 mg via INTRAVENOUS
  Administered 2023-10-06: 10 mg via INTRAVENOUS

## 2023-10-06 MED ORDER — PHENOL 1.4 % MT LIQD: 1.0000 | OROMUCOSAL | Status: DC | PRN

## 2023-10-06 MED ORDER — METOPROLOL TARTRATE 12.5 MG HALF TABLET
12.5000 mg | ORAL_TABLET | Freq: Two times a day (BID) | ORAL | Status: DC
  Administered 2023-10-06 – 2023-10-07 (×2): 12.5 mg via ORAL
  Filled 2023-10-06 (×2): qty 1

## 2023-10-06 MED ORDER — OXYCODONE-ACETAMINOPHEN 5-325 MG PO TABS: 1.0000 | ORAL_TABLET | ORAL | Status: DC | PRN

## 2023-10-06 MED ORDER — SODIUM CHLORIDE 0.9 % IV SOLN: 12.5000 mg | INTRAVENOUS | Status: DC | PRN

## 2023-10-06 MED ORDER — ZOLPIDEM TARTRATE 5 MG PO TABS
5.0000 mg | ORAL_TABLET | Freq: Every evening | ORAL | Status: DC | PRN
  Administered 2023-10-06: 5 mg via ORAL
  Filled 2023-10-06: qty 1

## 2023-10-06 SURGICAL SUPPLY — 66 items
APL SKNCLS STERI-STRIP NONHPOA (GAUZE/BANDAGES/DRESSINGS) ×1
BAG COUNTER SPONGE SURGICOUNT (BAG) ×1 IMPLANT
BAG SPNG CNTER NS LX DISP (BAG) ×1
BASKET BONE COLLECTION (BASKET) ×1 IMPLANT
BENZOIN TINCTURE PRP APPL 2/3 (GAUZE/BANDAGES/DRESSINGS) ×1 IMPLANT
BLADE CLIPPER SURG (BLADE) IMPLANT
BUR MATCHSTICK NEURO 3.0 LAGG (BURR) ×1 IMPLANT
BUR PRECISION FLUTE 6.0 (BURR) ×1 IMPLANT
CAGE ALTERA 10X31X9-13 15D (Cage) IMPLANT
CANISTER SUCT 3000ML PPV (MISCELLANEOUS) ×1 IMPLANT
CAP LOCK DLX THRD (Cap) IMPLANT
CLEANSER WND VASHE INSTL 34OZ (WOUND CARE) ×1 IMPLANT
CNTNR URN SCR LID CUP LEK RST (MISCELLANEOUS) ×1 IMPLANT
CONT SPEC 4OZ STRL OR WHT (MISCELLANEOUS) ×1
COVER BACK TABLE 60X90IN (DRAPES) ×1 IMPLANT
DRAPE C-ARM 42X72 X-RAY (DRAPES) ×2 IMPLANT
DRAPE HALF SHEET 40X57 (DRAPES) ×1 IMPLANT
DRAPE LAPAROTOMY 100X72X124 (DRAPES) ×1 IMPLANT
DRAPE SURG 17X23 STRL (DRAPES) ×4 IMPLANT
DRSG OPSITE POSTOP 4X6 (GAUZE/BANDAGES/DRESSINGS) ×1 IMPLANT
ELECT BLADE 4.0 EZ CLEAN MEGAD (MISCELLANEOUS) ×1
ELECT REM PT RETURN 9FT ADLT (ELECTROSURGICAL) ×1
ELECTRODE BLDE 4.0 EZ CLN MEGD (MISCELLANEOUS) ×1 IMPLANT
ELECTRODE REM PT RTRN 9FT ADLT (ELECTROSURGICAL) ×1 IMPLANT
EVACUATOR 1/8 PVC DRAIN (DRAIN) IMPLANT
GAUZE 4X4 16PLY ~~LOC~~+RFID DBL (SPONGE) ×1 IMPLANT
GLOVE BIO SURGEON STRL SZ 6 (GLOVE) ×1 IMPLANT
GLOVE BIO SURGEON STRL SZ8 (GLOVE) ×2 IMPLANT
GLOVE BIO SURGEON STRL SZ8.5 (GLOVE) ×2 IMPLANT
GLOVE BIOGEL PI IND STRL 6.5 (GLOVE) ×1 IMPLANT
GLOVE EXAM NITRILE XL STR (GLOVE) IMPLANT
GOWN STRL REUS W/ TWL LRG LVL3 (GOWN DISPOSABLE) ×1 IMPLANT
GOWN STRL REUS W/ TWL XL LVL3 (GOWN DISPOSABLE) ×2 IMPLANT
GOWN STRL REUS W/TWL 2XL LVL3 (GOWN DISPOSABLE) IMPLANT
GOWN STRL REUS W/TWL LRG LVL3 (GOWN DISPOSABLE) ×1
GOWN STRL REUS W/TWL XL LVL3 (GOWN DISPOSABLE) ×2
HEMOSTAT POWDER KIT SURGIFOAM (HEMOSTASIS) ×1 IMPLANT
KIT BASIN OR (CUSTOM PROCEDURE TRAY) ×1 IMPLANT
KIT GRAFTMAG DEL NEURO DISP (NEUROSURGERY SUPPLIES) IMPLANT
KIT POSITION SURG JACKSON T1 (MISCELLANEOUS) ×1 IMPLANT
KIT TURNOVER KIT B (KITS) ×1 IMPLANT
NDL HYPO 21X1.5 SAFETY (NEEDLE) IMPLANT
NDL HYPO 22X1.5 SAFETY MO (MISCELLANEOUS) ×1 IMPLANT
NEEDLE HYPO 21X1.5 SAFETY (NEEDLE) IMPLANT
NEEDLE HYPO 22X1.5 SAFETY MO (MISCELLANEOUS) ×1 IMPLANT
NS IRRIG 1000ML POUR BTL (IV SOLUTION) ×1 IMPLANT
PACK LAMINECTOMY NEURO (CUSTOM PROCEDURE TRAY) ×1 IMPLANT
PAD ARMBOARD 7.5X6 YLW CONV (MISCELLANEOUS) ×3 IMPLANT
PATTIES SURGICAL .5 X1 (DISPOSABLE) IMPLANT
PUTTY DBM 5CC CALC GRAN (Putty) IMPLANT
ROD CURVED TI 6.35X45 (Rod) IMPLANT
SCREW PA DLX CREO 7.5X45 (Screw) IMPLANT
SPIKE FLUID TRANSFER (MISCELLANEOUS) ×1 IMPLANT
SPONGE NEURO XRAY DETECT 1X3 (DISPOSABLE) IMPLANT
SPONGE SURGIFOAM ABS GEL 100 (HEMOSTASIS) IMPLANT
SPONGE T-LAP 4X18 ~~LOC~~+RFID (SPONGE) IMPLANT
STRIP CLOSURE SKIN 1/2X4 (GAUZE/BANDAGES/DRESSINGS) ×1 IMPLANT
SUT PROLENE 6 0 BV (SUTURE) IMPLANT
SUT VIC AB 1 CT1 18XBRD ANBCTR (SUTURE) ×2 IMPLANT
SUT VIC AB 1 CT1 8-18 (SUTURE) ×2
SUT VIC AB 2-0 CP2 18 (SUTURE) ×2 IMPLANT
SYR 20ML LL LF (SYRINGE) IMPLANT
TOWEL GREEN STERILE (TOWEL DISPOSABLE) ×1 IMPLANT
TOWEL GREEN STERILE FF (TOWEL DISPOSABLE) ×1 IMPLANT
TRAY FOLEY MTR SLVR 16FR STAT (SET/KITS/TRAYS/PACK) ×1 IMPLANT
WATER STERILE IRR 1000ML POUR (IV SOLUTION) ×1 IMPLANT

## 2023-10-06 NOTE — Anesthesia Procedure Notes (Signed)
Procedure Name: Intubation Date/Time: 10/06/2023 12:42 PM  Performed by: Ivin Poot, CRNAPre-anesthesia Checklist: Patient identified, Emergency Drugs available, Suction available and Patient being monitored Patient Re-evaluated:Patient Re-evaluated prior to induction Oxygen Delivery Method: Circle System Utilized Preoxygenation: Pre-oxygenation with 100% oxygen Induction Type: IV induction Ventilation: Mask ventilation without difficulty Laryngoscope Size: Mac and 3 Grade View: Grade I Tube type: Oral Tube size: 7.0 mm Number of attempts: 1 Airway Equipment and Method: Stylet and Oral airway Placement Confirmation: ETT inserted through vocal cords under direct vision, positive ETCO2 and breath sounds checked- equal and bilateral Secured at: 21 cm Tube secured with: Tape Dental Injury: Teeth and Oropharynx as per pre-operative assessment
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
## 2023-10-06 NOTE — Anesthesia Postprocedure Evaluation (Signed)
Anesthesia Post Note  Patient: Jessica Ramsey  Procedure(s) Performed: POSTERIOR LUMBAR INTERBODY FUSION,INTERBODY PROSTHESIS,POSTERIOR INSTRUMENTATION, LUMBAR FOUR-FIVE     Patient location during evaluation: PACU Anesthesia Type: General Level of consciousness: awake and alert, oriented and patient cooperative Pain management: pain level controlled Vital Signs Assessment: post-procedure vital signs reviewed and stable Respiratory status: spontaneous breathing, nonlabored ventilation and respiratory function stable Cardiovascular status: blood pressure returned to baseline and stable Postop Assessment: no apparent nausea or vomiting Anesthetic complications: no   No notable events documented.  Last Vitals:  Vitals:   10/06/23 1645 10/06/23 1702  BP: 128/61 115/69  Pulse: 98 97  Resp: 12 16  Temp:    SpO2: 91% 98%    Last Pain:  Vitals:   10/06/23 1655  TempSrc:   PainSc: 7                  Lannie Fields

## 2023-10-06 NOTE — Op Note (Addendum)
Brief history: The patient is a 67 year old white female was complained of back and left greater right leg pain consistent with neurogenic claudication/lumbar radiculopathy.  She has failed medical treatment and was worked up with a lumbar MRI and lumbar x-rays which demonstrated an L4-5 spondylolisthesis with spinal stenosis.  I discussed the various treatment options with her.  She has decided proceed with surgery.  Preoperative diagnosis: Lumbar spondylolisthesis, degenerative disc disease, spinal stenosis compressing both the L4 and the L5 nerve roots; lumbago; lumbar radiculopathy; neurogenic claudication  Postoperative diagnosis: The same  Procedure: Bilateral L4-5 laminotomy/foraminotomies/medial facetectomy to decompress the bilateral L4 and L5 nerve roots(the work required to do this was in addition to the work required to do the posterior lumbar interbody fusion because of the patient's spinal stenosis, facet arthropathy. Etc. requiring a wide decompression of the nerve roots.); left L4-5 transforaminal lumbar interbody fusion with local morselized autograft bone and Zimmer DBM; insertion of interbody prosthesis at L4-5 (globus peek expandable interbody prosthesis); posterior nonsegmental instrumentation from L4 to L5 with globus titanium pedicle screws and rods; posterior lateral arthrodesis at L4-5 with local morselized autograft bone and Zimmer DBM.  Surgeon: Dr. Delma Officer  Asst.: Dr. Donalee Citrin  Anesthesia: Gen. endotracheal  Estimated blood loss: 200 cc  Drains: None  Complications: None  Description of procedure: The patient was brought to the operating room by the anesthesia team. General endotracheal anesthesia was induced. The patient was turned to the prone position on the Wilson frame. The patient's lumbosacral region was then prepared with Betadine scrub and Betadine solution. Sterile drapes were applied.  I then injected the area to be incised with Marcaine with  epinephrine solution. I then used the scalpel to make a linear midline incision over the L4-5 interspace. I then used electrocautery to perform a bilateral subperiosteal dissection exposing the spinous process and lamina of L4-5. We then obtained intraoperative radiograph to confirm our location. We then inserted the Verstrac retractor to provide exposure.  I began the decompression by using the high speed drill to perform laminotomies at L4-5 bilaterally. We then used the Kerrison punches to widen the laminotomy and removed the ligamentum flavum at L4-5 bilaterally. We used the Kerrison punches to remove the medial facets at L4-5 bilaterally, we removed the left L4-5 facet. We performed wide foraminotomies about the bilateral L4 and L5 nerve roots completing the decompression.  We now turned our attention to the posterior lumbar interbody fusion. I used a scalpel to incise the intervertebral disc at L4-5 bilaterally. I then performed a partial intervertebral discectomy at L4-5 bilaterally using the pituitary forceps. We prepared the vertebral endplates at L4-5 bilaterally for the fusion by removing the soft tissues with the curettes. We then used the trial spacers to pick the appropriate sized interbody prosthesis. We prefilled his prosthesis with a combination of local morselized autograft bone that we obtained during the decompression as well as Zimmer DBM. We inserted the prefilled prosthesis into the interspace at L4-5 from the left, we then turned and expanded the prosthesis. There was a good snug fit of the prosthesis in the interspace. We then filled and the remainder of the intervertebral disc space with local morselized autograft bone and Zimmer DBM. This completed the posterior lumbar interbody arthrodesis.  During the decompression and insertion of the prosthesis the assistant protected the thecal sac and nerve roots with the D'Errico retractor.  We now turned attention to the instrumentation.  Under fluoroscopic guidance we cannulated the bilateral L4 and L5  pedicles with the bone probe. We then removed the bone probe. We then tapped the pedicle with a 6.5 millimeter tap. We then removed the tap. We probed inside the tapped pedicle with a ball probe to rule out cortical breaches. We then inserted a 7.5 x 55 millimeter pedicle screw into the L4 and L5 pedicles bilaterally under fluoroscopic guidance. We then palpated along the medial aspect of the pedicles to rule out cortical breaches. There were none. The nerve roots were not injured. We then connected the unilateral pedicle screws with a lordotic rod. We compressed the construct and secured the rod in place with the caps. We then tightened the caps appropriately. This completed the instrumentation from L4-5 bilaterally.  We now turned our attention to the posterior lateral arthrodesis at L4-5. We used the high-speed drill to decorticate the remainder of the facets, pars, transverse process at L4-5. We then applied a combination of local morselized autograft bone and Zimmer DBM over these decorticated posterior lateral structures. This completed the posterior lateral arthrodesis.  We then obtained hemostasis using bipolar electrocautery. We irrigated the wound out with vashe solution. We inspected the thecal sac and nerve roots and noted they were well decompressed. We then removed the retractor.  We reapproximated patient's thoracolumbar fascia with interrupted #1 Vicryl suture. We reapproximated patient's subcutaneous tissue with interrupted 2-0 Vicryl suture. The reapproximated patient's skin with Steri-Strips and benzoin. The wound was then coated with bacitracin ointment. A sterile dressing was applied. The drapes were removed. The patient was subsequently returned to the supine position where they were extubated by the anesthesia team. He was then transported to the post anesthesia care unit in stable condition. All sponge instrument and needle  counts were reportedly correct at the end of this case.

## 2023-10-06 NOTE — Transfer of Care (Addendum)
Immediate Anesthesia Transfer of Care Note  Patient: Jessica Ramsey  Procedure(s) Performed: POSTERIOR LUMBAR INTERBODY FUSION,INTERBODY PROSTHESIS,POSTERIOR INSTRUMENTATION, LUMBAR FOUR-FIVE  Patient Location: PACU  Anesthesia Type:General  Level of Consciousness: awake, alert , and oriented  Airway & Oxygen Therapy: Patient Spontanous Breathing and Patient connected to nasal cannula oxygen  Post-op Assessment: Report given to RN and Post -op Vital signs reviewed and stable  Post vital signs: Reviewed and stable  Last Vitals:  Vitals Value Taken Time  BP 128/77 10/06/23 1615  Temp    Pulse 115 10/06/23 1621  Resp 19 10/06/23 1621  SpO2 97 % 10/06/23 1621  Vitals shown include unfiled device data.  Last Pain:  Vitals:   10/06/23 1021  TempSrc:   PainSc: 1       Patients Stated Pain Goal: 0 (10/06/23 1021)  Complications: No notable events documented.

## 2023-10-07 DIAGNOSIS — M4316 Spondylolisthesis, lumbar region: Secondary | ICD-10-CM | POA: Diagnosis not present

## 2023-10-07 MED ORDER — OXYCODONE HCL 5 MG PO TABS
5.0000 mg | ORAL_TABLET | ORAL | Status: DC | PRN
  Administered 2023-10-07: 5 mg via ORAL

## 2023-10-07 MED ORDER — DOCUSATE SODIUM 100 MG PO CAPS: 100.0000 mg | ORAL_CAPSULE | Freq: Two times a day (BID) | ORAL | 0 refills | Status: DC

## 2023-10-07 MED ORDER — HYDROMORPHONE HCL 2 MG PO TABS
2.0000 mg | ORAL_TABLET | ORAL | Status: DC | PRN
  Administered 2023-10-07: 2 mg via ORAL
  Filled 2023-10-07: qty 1

## 2023-10-07 MED ORDER — METHOCARBAMOL 500 MG PO TABS: ORAL_TABLET | ORAL | 1 refills | Status: AC

## 2023-10-07 MED ORDER — HYDROMORPHONE HCL 2 MG PO TABS: 2.0000 mg | ORAL_TABLET | ORAL | 0 refills | Status: AC | PRN

## 2023-10-07 NOTE — Progress Notes (Signed)
 Patient alert and oriented, void, ambulate. Surgical site clean and dry no sign of infection. D/c instructions explain and given to the patient all questions answered.
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
## 2023-10-07 NOTE — Evaluation (Signed)
Physical Therapy Evaluation  Patient Details Name: Jessica Ramsey MRN: [id number] DOB: 1955/12/27 Today's Date: 10/07/2023  History of Present Illness  Pt is a 67 y/o female who presents s/p L4-L5 TLIF on 10/06/2023. PMH significant for Aortic insufficiency, concussion, COPD, fibromyalgia, Hangman's fracture 1984, HSV-2, HTN, hypothyroidism, melanoma, pelvic fracture, CVA, aortic valve replacement, cervical fusion, femur fracture surgery, L TKA.   Clinical Impression  Pt admitted with above diagnosis. At the time of PT eval, pt was able to demonstrate transfers and ambulation with gross CGA to supervision for safety and RW for support. Pt was educated on precautions, brace application/wearing schedule, appropriate activity progression, and car transfer. Pt currently with functional limitations due to the deficits listed below (see PT Problem List). Pt will benefit from skilled PT to increase their independence and safety with mobility to allow discharge to the venue listed below.          If plan is discharge home, recommend the following: A little help with walking and/or transfers;A little help with bathing/dressing/bathroom;Assistance with cooking/housework;Assist for transportation;Help with stairs or ramp for entrance   Can travel by private vehicle        Equipment Recommendations None recommended by PT  Recommendations for Other Services       Functional Status Assessment Patient has had a recent decline in their functional status and demonstrates the ability to make significant improvements in function in a reasonable and predictable amount of time.     Precautions / Restrictions Precautions Precautions: Fall;Back Precaution Booklet Issued: Yes (comment) Precaution Comments: Reviewed handout and pt was cued for precautions during functional mobility. Required Braces or Orthoses: Spinal Brace Spinal Brace: Lumbar corset;Applied in sitting position Restrictions Weight  Bearing Restrictions: No      Mobility  Bed Mobility Overal bed mobility: Needs Assistance Bed Mobility: Rolling, Sidelying to Sit Rolling: Supervision Sidelying to sit: Supervision       General bed mobility comments: VC's for optimal log roll technique. HOB flat and rails lowered to simulate home environment.    Transfers Overall transfer level: Needs assistance Equipment used: None Transfers: Sit to/from Stand Sit to Stand: Contact guard assist, Supervision           General transfer comment: Pt demonstrated good hand placement on seated surface for safety. No unsteadiness or LOB noted. Supervision by end of session.    Ambulation/Gait Ambulation/Gait assistance: Contact guard assist, Supervision Gait Distance (Feet): 250 Feet Assistive device: Rolling walker (2 wheels) Gait Pattern/deviations: Step-through pattern, Decreased stride length, Trunk flexed Gait velocity: Decreased Gait velocity interpretation: 1.31 - 2.62 ft/sec, indicative of limited community ambulator   General Gait Details: Slow but generally steady with RW for support. Pt without overt LOB. VC's throughout for improved posture, closer walker proximity and forward gaze.  Stairs Stairs: Yes Stairs assistance: Contact guard assist Stair Management: One rail Left, Step to pattern Number of Stairs: 2 General stair comments: VC's for sequencing and general safety. No assist required but hands on guarding for safety.  Wheelchair Mobility     Tilt Bed    Modified Rankin (Stroke Patients Only)       Balance Overall balance assessment: Needs assistance Sitting-balance support: Feet supported, No upper extremity supported Sitting balance-Leahy Scale: Fair     Standing balance support: No upper extremity supported, During functional activity Standing balance-Leahy Scale: Fair Standing balance comment: Pt able to walk around the room without assist or AD but feels more comfortable with the RW  Pertinent Vitals/Pain Pain Assessment Pain Assessment: Faces Faces Pain Scale: Hurts even more Pain Location: incision site Pain Descriptors / Indicators: Operative site guarding, Sore Pain Intervention(s): Limited activity within patient's tolerance, Monitored during session, Repositioned    Home Living Family/patient expects to be discharged to:: Private residence Living Arrangements: Alone Available Help at Discharge: Friend(s);Available 24 hours/day (First few days) Type of Home: House Home Access: Stairs to enter Entrance Stairs-Rails: Left Entrance Stairs-Number of Steps: 2 Alternate Level Stairs-Number of Steps: Flight Home Layout: Two level;Able to live on main level with bedroom/bathroom Home Equipment: Rollator (4 wheels);Cane - single point;BSC/3in1;Grab bars - tub/shower Additional Comments: Pt utilizes a stool to get into high bed.    Prior Function Prior Level of Function : History of Falls (last six months)             Mobility Comments: Pt reports she falls ~6x/year. Pt states she has a cane in every room in case she needs it but cannot use the rollator in the house.       Extremity/Trunk Assessment   Upper Extremity Assessment Upper Extremity Assessment: Defer to OT evaluation    Lower Extremity Assessment Lower Extremity Assessment: Generalized weakness (Mild; consistent with pre-op diagnosis)    Cervical / Trunk Assessment Cervical / Trunk Assessment: Back Surgery  Communication   Communication Communication: No apparent difficulties Cueing Techniques: Verbal cues;Gestural cues  Cognition Arousal: Alert Behavior During Therapy: WFL for tasks assessed/performed Overall Cognitive Status: Within Functional Limits for tasks assessed                                          General Comments      Exercises     Assessment/Plan    PT Assessment Patient needs continued PT services  PT  Problem List Decreased strength;Decreased activity tolerance;Decreased balance;Decreased mobility;Decreased knowledge of use of DME;Decreased safety awareness;Decreased knowledge of precautions;Pain       PT Treatment Interventions DME instruction;Gait training;Stair training;Functional mobility training;Therapeutic activities;Therapeutic exercise;Balance training;Patient/family education    PT Goals (Current goals can be found in the Care Plan section)  Acute Rehab PT Goals Patient Stated Goal: Home today PT Goal Formulation: With patient Time For Goal Achievement: 10/14/23 Potential to Achieve Goals: Good    Frequency Min 5X/week     Co-evaluation               AM-PAC PT "6 Clicks" Mobility  Outcome Measure Help needed turning from your back to your side while in a flat bed without using bedrails?: A Little Help needed moving from lying on your back to sitting on the side of a flat bed without using bedrails?: A Little Help needed moving to and from a bed to a chair (including a wheelchair)?: A Little Help needed standing up from a chair using your arms (e.g., wheelchair or bedside chair)?: A Little Help needed to walk in hospital room?: A Little Help needed climbing 3-5 steps with a railing? : A Little 6 Click Score: 18    End of Session Equipment Utilized During Treatment: Gait belt Activity Tolerance: Patient tolerated treatment well Patient left: in bed;with call bell/phone within reach Nurse Communication: Mobility status PT Visit Diagnosis: Unsteadiness on feet (R26.81);Pain Pain - part of body:  (back)    Time: 7829-5621 PT Time Calculation (min) (ACUTE ONLY): 26 min   Charges:   PT Evaluation $PT Eval Low  Complexity: 1 Low PT Treatments $Gait Training: 8-22 mins PT General Charges $$ ACUTE PT VISIT: 1 Visit         Conni Slipper, PT, DPT Acute Rehabilitation Services Secure Chat Preferred Office: [phone]   Jessica Ramsey 10/07/2023,  9:46 AM

## 2023-10-07 NOTE — Evaluation (Signed)
Occupational Therapy Evaluation Patient Details Name: Jessica Ramsey MRN: [id number] DOB: 11/29/1955 Today's Date: 10/07/2023   History of Present Illness Pt is a 67 y/o female who presents s/p L4-L5 TLIF on 10/06/2023. PMH significant for Aortic insufficiency, concussion, COPD, fibromyalgia, Hangman's fracture 1984, HSV-2, HTN, hypothyroidism, melanoma, pelvic fracture, CVA, aortic valve replacement, cervical fusion, femur fracture surgery, L TKA.   Clinical Impression   Jessica Ramsey was evaluated s/p the above spine surgery. She is mod I and lives alone at baseline. Upon evaluation pt was limited by surgical pain, spinal precautions and limited activity tolerance. Overall she demonstrated mod I ability to complete ADLs and functional mobility both with and without RW. Provided cues and education on spinal precautions and compensatory techniques throughout, handout provided and pt demonstrated great recall during ADLs and mobility. Pt does not require further acute OT services. Recommend d/c home with support of family.         If plan is discharge home, recommend the following: Assistance with cooking/housework;Assist for transportation    Functional Status Assessment  Patient has had a recent decline in their functional status and demonstrates the ability to make significant improvements in function in a reasonable and predictable amount of time.  Equipment Recommendations  None recommended by OT       Precautions / Restrictions Precautions Precautions: Fall;Back Precaution Booklet Issued: Yes (comment) Precaution Comments: Reviewed handout and pt was cued for precautions during functional mobility. Required Braces or Orthoses: Spinal Brace Spinal Brace: Lumbar corset;Applied in sitting position Restrictions Weight Bearing Restrictions: No      Mobility Bed Mobility Overal bed mobility: Needs Assistance             General bed mobility comments: OOB upon arrival     Transfers Overall transfer level: Needs assistance Equipment used: Rolling walker (2 wheels) Transfers: Sit to/from Stand Sit to Stand: Modified independent (Device/Increase time)                  Balance Overall balance assessment: Needs assistance Sitting-balance support: Feet supported, No upper extremity supported Sitting balance-Leahy Scale: Fair     Standing balance support: No upper extremity supported, During functional activity Standing balance-Leahy Scale: Fair                             ADL either performed or assessed with clinical judgement   ADL Overall ADL's : Modified independent         Functional mobility during ADLs: Modified independent;Rolling walker (2 wheels) General ADL Comments: after review of compensatory techniques for spinal precautions; pt demonstrated mod I ability to complete all ADLs. pt does have tub shower at home, pt does not think tub bench would fit, discussed shower stool options for out of pocket purchase.     Vision Baseline Vision/History: 0 No visual deficits Vision Assessment?: No apparent visual deficits     Perception Perception: Within Functional Limits       Praxis Praxis: WFL       Pertinent Vitals/Pain Pain Assessment Pain Assessment: Faces Faces Pain Scale: Hurts even more Pain Location: incision site Pain Descriptors / Indicators: Operative site guarding, Sore Pain Intervention(s): Limited activity within patient's tolerance, Monitored during session     Extremity/Trunk Assessment Upper Extremity Assessment Upper Extremity Assessment: Overall WFL for tasks assessed   Lower Extremity Assessment Lower Extremity Assessment: Defer to PT evaluation   Cervical / Trunk Assessment Cervical / Trunk Assessment: Back Surgery  Communication Communication Communication: No apparent difficulties Cueing Techniques: Verbal cues;Gestural cues   Cognition Arousal: Alert Behavior During Therapy:  WFL for tasks assessed/performed Overall Cognitive Status: Within Functional Limits for tasks assessed           General Comments: good recall and carryover of spinal precautions     General Comments  VSS on RA     Home Living Family/patient expects to be discharged to:: Private residence Living Arrangements: Alone Available Help at Discharge: Friend(s);Available 24 hours/day Type of Home: House Home Access: Stairs to enter [employer] of Steps: 2 Entrance Stairs-Rails: Left Home Layout: Two level;Able to live on main level with bedroom/bathroom Alternate Level Stairs-Number of Steps: Flight   Bathroom Shower/Tub: Tub/shower unit         Home Equipment: Rollator (4 wheels);Cane - single point;BSC/3in1;Grab bars - tub/shower   Additional Comments: Pt utilizes a stool to get into high bed.      Prior Functioning/Environment Prior Level of Function : History of Falls (last six months)             Mobility Comments: Pt reports she falls ~6x/year. Pt states she has a cane in every room in case she needs it but cannot use the rollator in the house. ADLs Comments: mod I - often cares for 4 yo grandchild with CP        OT Problem List: Decreased range of motion;Decreased knowledge of precautions         OT Goals(Current goals can be found in the care plan section) Acute Rehab OT Goals Patient Stated Goal: home OT Goal Formulation: With patient Time For Goal Achievement: 10/07/23 Potential to Achieve Goals: Good   AM-PAC OT "6 Clicks" Daily Activity     Outcome Measure Help from another person eating meals?: None Help from another person taking care of personal grooming?: None Help from another person toileting, which includes using toliet, bedpan, or urinal?: None Help from another person bathing (including washing, rinsing, drying)?: None Help from another person to put on and taking off regular upper body clothing?: None Help from another person to  put on and taking off regular lower body clothing?: None 6 Click Score: 24   End of Session Equipment Utilized During Treatment: Back brace Nurse Communication: Mobility status  Activity Tolerance: Patient tolerated treatment well Patient left: in chair;with call bell/phone within reach  OT Visit Diagnosis: Other abnormalities of gait and mobility (R26.89)                Time: 1610-9604 OT Time Calculation (min): 22 min Charges:  OT General Charges $OT Visit: 1 Visit OT Evaluation $OT Eval Moderate Complexity: 1 Mod  Derenda Mis, OTR/L Acute Rehabilitation Services Office [phone] Secure Chat Communication Preferred   Donia Pounds 10/07/2023, 9:55 AM

## 2023-10-15 MED FILL — Heparin Sodium (Porcine) Inj 1000 Unit/ML: INTRAMUSCULAR | Qty: 30 | Status: AC

## 2023-10-27 ENCOUNTER — Ambulatory Visit (HOSPITAL_BASED_OUTPATIENT_CLINIC_OR_DEPARTMENT_OTHER): Payer: Medicare Other | Admitting: Obstetrics & Gynecology

## 2023-12-02 ENCOUNTER — Encounter (HOSPITAL_BASED_OUTPATIENT_CLINIC_OR_DEPARTMENT_OTHER): Payer: Self-pay | Admitting: Obstetrics & Gynecology

## 2023-12-02 ENCOUNTER — Ambulatory Visit (INDEPENDENT_AMBULATORY_CARE_PROVIDER_SITE_OTHER): Payer: Medicare Other | Admitting: Obstetrics & Gynecology

## 2023-12-02 ENCOUNTER — Other Ambulatory Visit (HOSPITAL_COMMUNITY)
Admission: RE | Admit: 2023-12-02 | Discharge: 2023-12-02 | Disposition: A | Discharge status: Home / Self Care | Payer: Medicare Other | Source: Ambulatory Visit | Attending: Obstetrics & Gynecology | Admitting: Obstetrics & Gynecology

## 2023-12-02 VITALS — BP 146/66 | HR 61 | Ht 59.5 in | Wt 110.4 lb

## 2023-12-02 DIAGNOSIS — Z8659 Personal history of other mental and behavioral disorders: Secondary | ICD-10-CM

## 2023-12-02 DIAGNOSIS — F17290 Nicotine dependence, other tobacco product, uncomplicated: Secondary | ICD-10-CM | POA: Diagnosis not present

## 2023-12-02 DIAGNOSIS — Z01419 Encounter for gynecological examination (general) (routine) without abnormal findings: Secondary | ICD-10-CM | POA: Insufficient documentation

## 2023-12-02 DIAGNOSIS — B977 Papillomavirus as the cause of diseases classified elsewhere: Secondary | ICD-10-CM | POA: Diagnosis not present

## 2023-12-02 DIAGNOSIS — Z1151 Encounter for screening for human papillomavirus (HPV): Secondary | ICD-10-CM | POA: Diagnosis not present

## 2023-12-02 DIAGNOSIS — A609 Anogenital herpesviral infection, unspecified: Secondary | ICD-10-CM

## 2023-12-02 DIAGNOSIS — Z124 Encounter for screening for malignant neoplasm of cervix: Secondary | ICD-10-CM

## 2023-12-02 DIAGNOSIS — Z9189 Other specified personal risk factors, not elsewhere classified: Secondary | ICD-10-CM

## 2023-12-02 DIAGNOSIS — N898 Other specified noninflammatory disorders of vagina: Secondary | ICD-10-CM

## 2023-12-02 DIAGNOSIS — R87810 Cervical high risk human papillomavirus (HPV) DNA test positive: Secondary | ICD-10-CM | POA: Diagnosis not present

## 2023-12-02 DIAGNOSIS — Z780 Asymptomatic menopausal state: Secondary | ICD-10-CM

## 2023-12-02 MED ORDER — ACYCLOVIR 400 MG PO TABS
400.0000 mg | ORAL_TABLET | Freq: Two times a day (BID) | ORAL | 3 refills | Status: AC
  Filled 2024-02-27: qty 180, 90d supply, fill #0
  Filled 2024-09-27: qty 180, 90d supply, fill #1

## 2023-12-02 MED ORDER — METRONIDAZOLE 500 MG PO TABS: 500.0000 mg | ORAL_TABLET | Freq: Two times a day (BID) | ORAL | 0 refills | Status: DC
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
## 2023-12-05 LAB — CYTOLOGY - PAP
Comment: NEGATIVE
Comment: NEGATIVE
Comment: NEGATIVE
Diagnosis: NEGATIVE
Diagnosis: REACTIVE
HPV 16: NEGATIVE
HPV 18 / 45: NEGATIVE
High risk HPV: POSITIVE — AB

## 2024-02-04 IMAGING — DX DG CHEST 2V
2 series · 2 of 2 positions shown · non-contrast
Comparison: Previous studies including CT chest done on 01/25/2020

CLINICAL DATA: Preop evaluation for cardiac surgery

EXAM:
CHEST - 2 VIEW

[w chest pa]
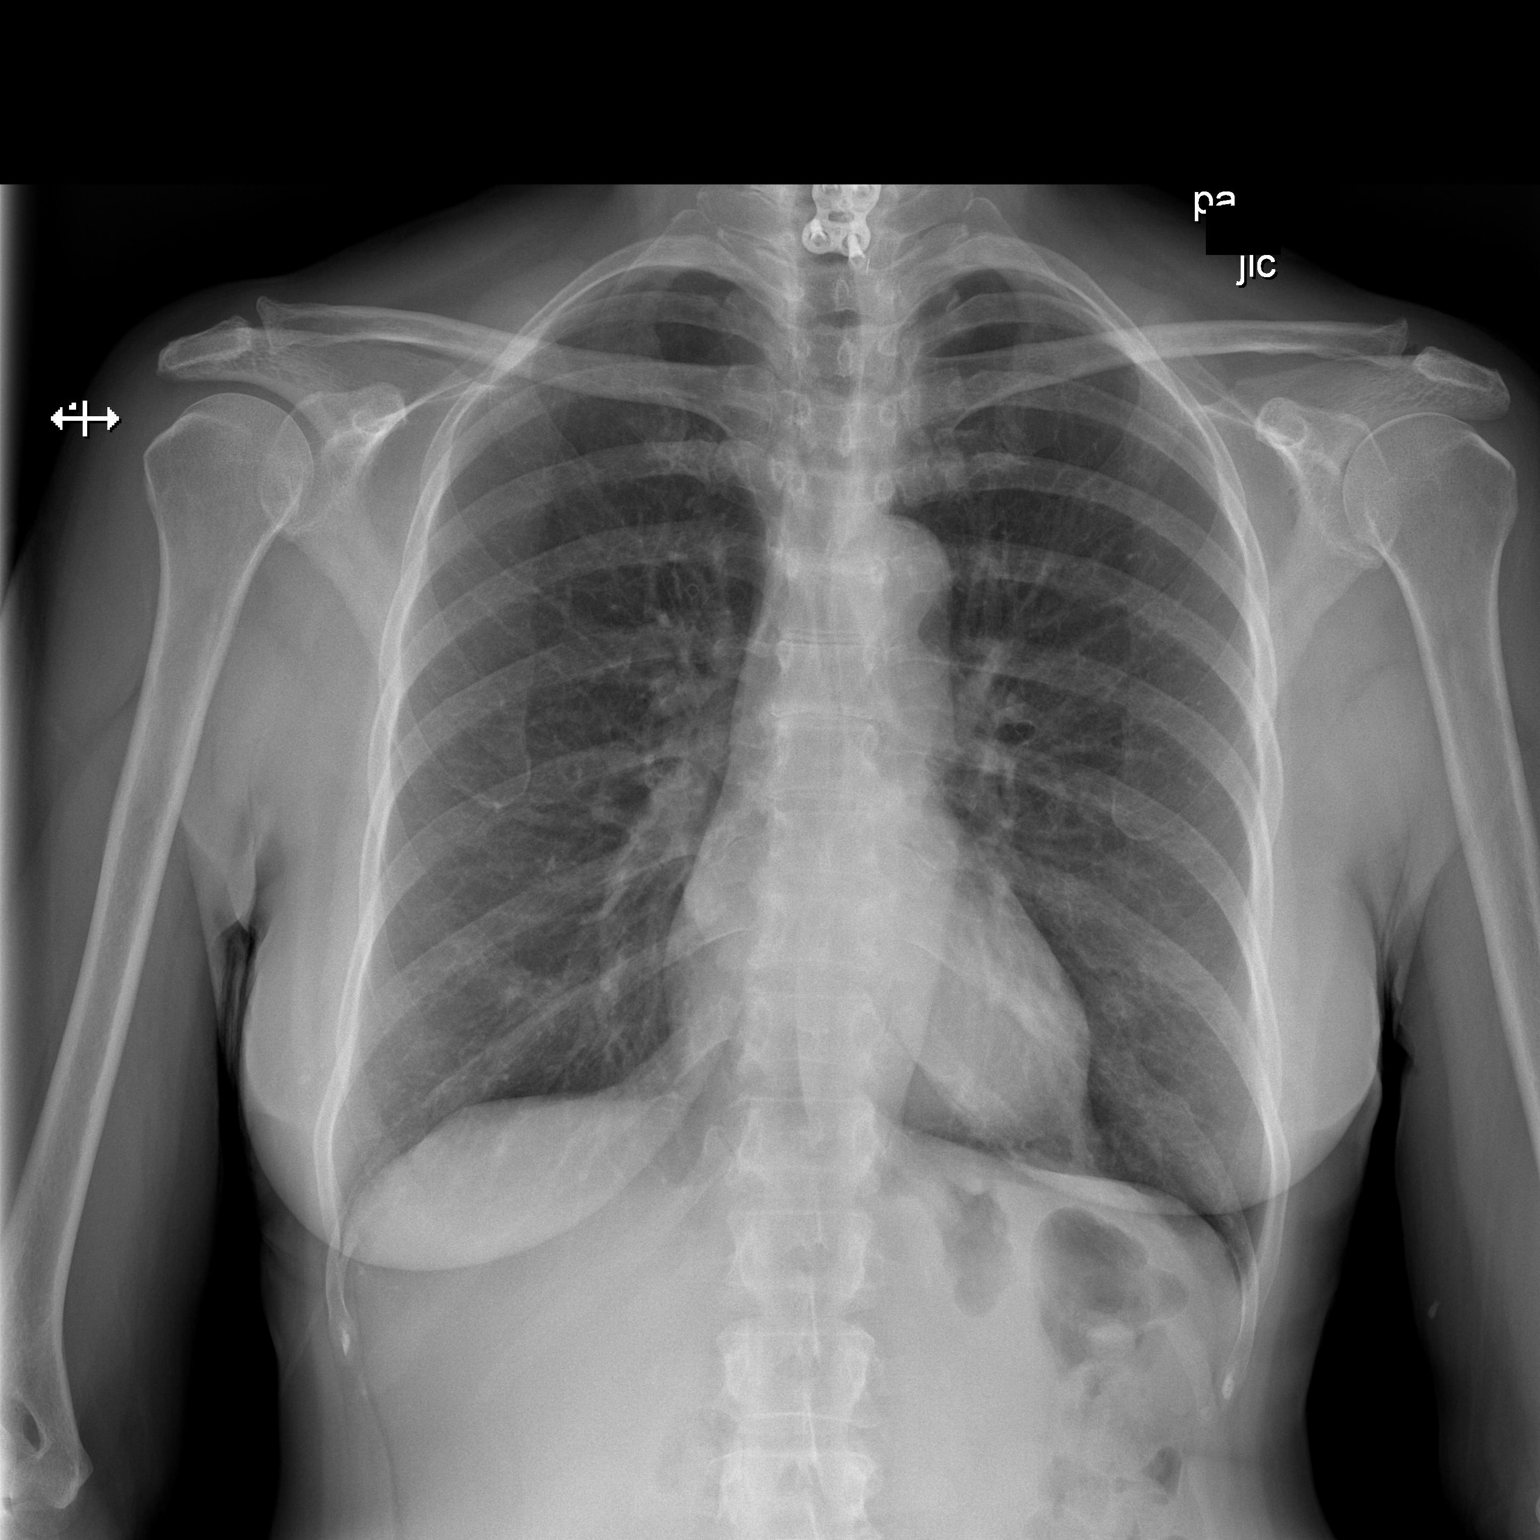

[w chest lat]
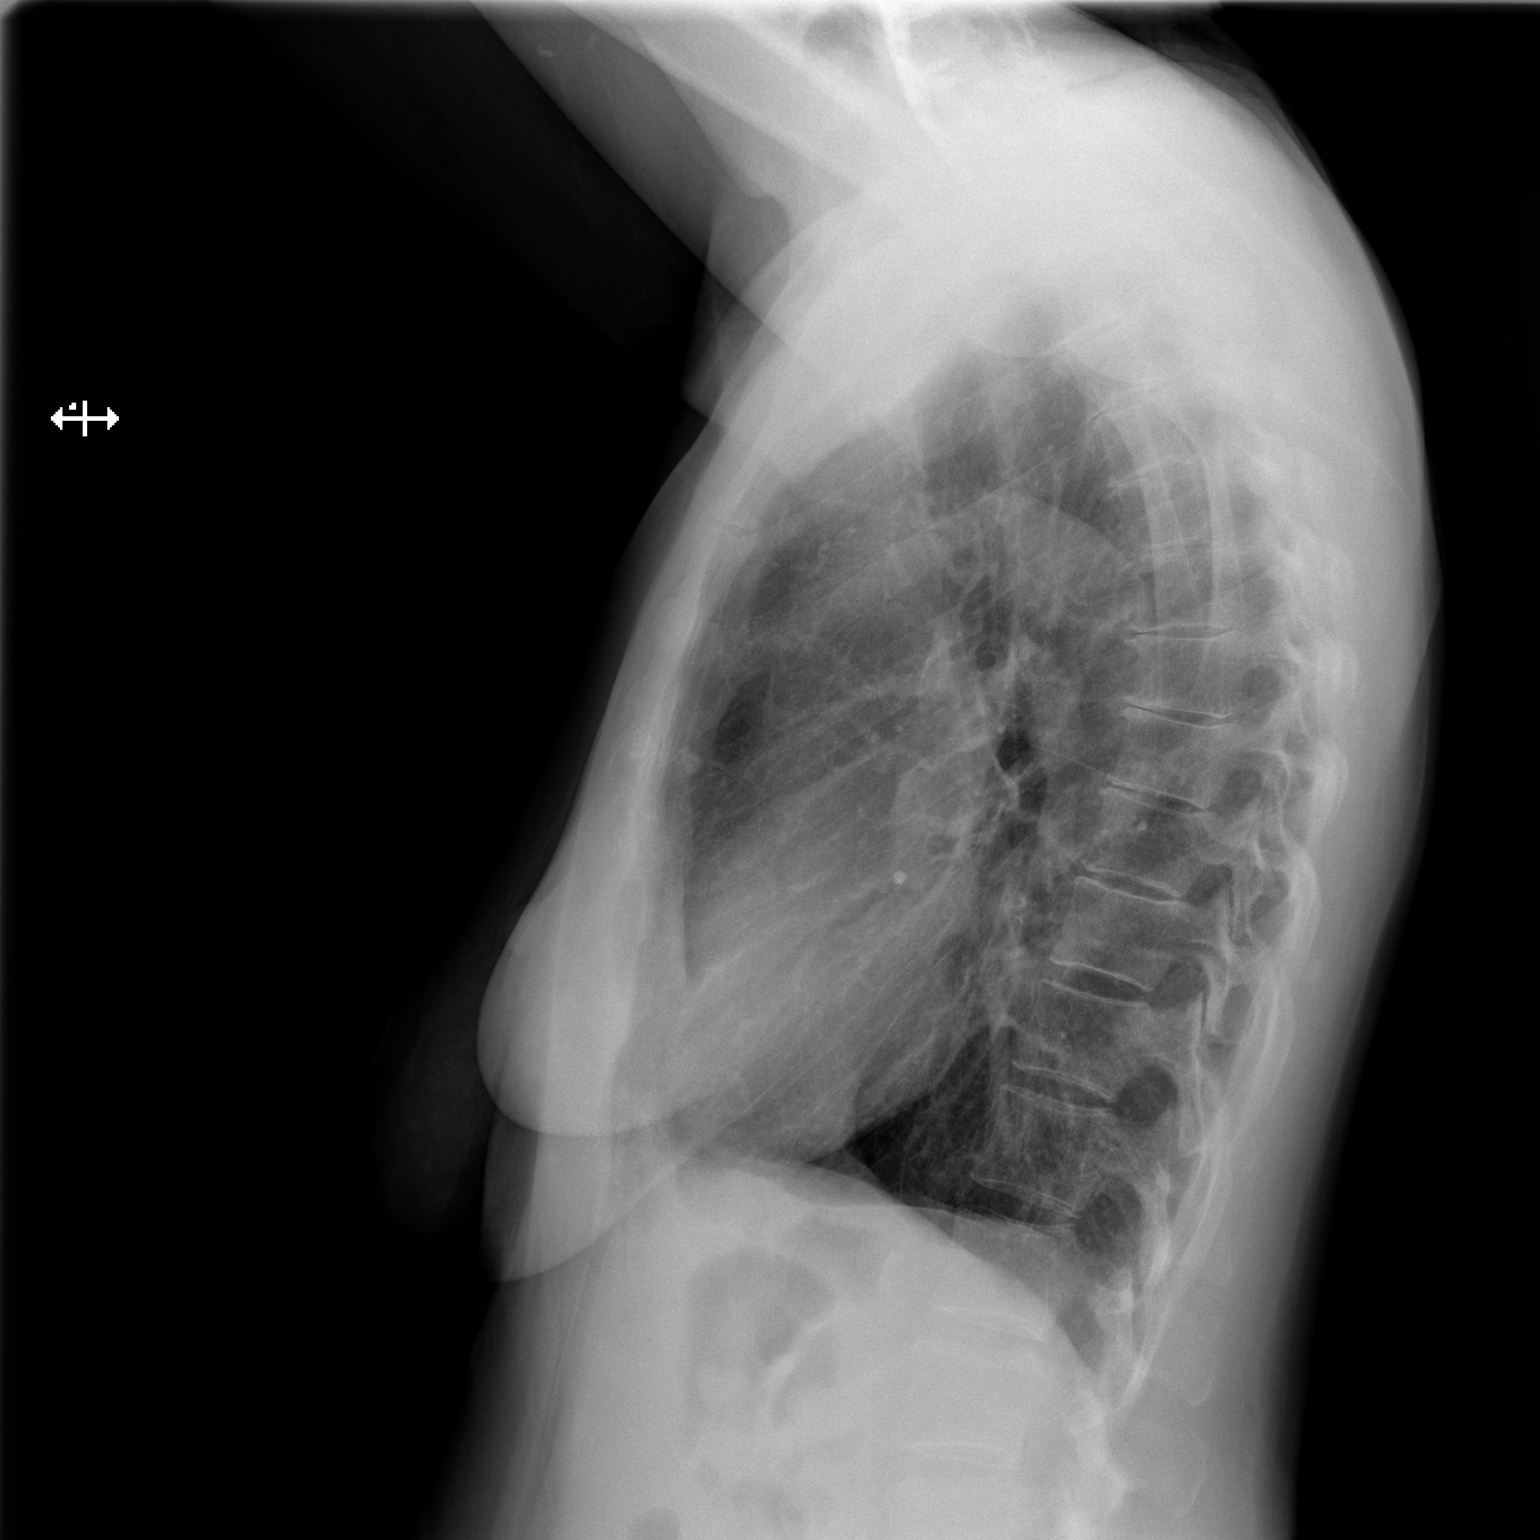

[2 of 2 positions shown; findings below may reference images not displayed]

FINDINGS: Cardiac size is within normal limits. There are no signs of
pulmonary edema or focal pulmonary consolidation. There is no
pleural effusion or pneumothorax. Low position of diaphragms may be
normal variation due to patient's body habitus or suggest COPD.
There is surgical fusion in the lower cervical spine.
IMPRESSION: No active cardiopulmonary disease.

## 2024-02-27 ENCOUNTER — Other Ambulatory Visit (HOSPITAL_COMMUNITY): Payer: Self-pay

## 2024-02-27 ENCOUNTER — Other Ambulatory Visit: Payer: Self-pay

## 2024-02-27 MED ORDER — DULOXETINE HCL 60 MG PO CPEP
60.0000 mg | ORAL_CAPSULE | Freq: Every day | ORAL | 3 refills | Status: DC
  Filled 2024-02-27 (×2): qty 90, 90d supply, fill #0

## 2024-02-27 MED ORDER — MELOXICAM 15 MG PO TABS
15.0000 mg | ORAL_TABLET | Freq: Every day | ORAL | 2 refills | Status: AC
  Filled 2024-02-27 (×2): qty 90, 90d supply, fill #0

## 2024-02-27 MED ORDER — GABAPENTIN 100 MG PO CAPS
100.0000 mg | ORAL_CAPSULE | Freq: Three times a day (TID) | ORAL | 1 refills | Status: DC
  Filled 2024-02-27 (×2): qty 270, 90d supply, fill #0

## 2024-02-27 MED ORDER — PANTOPRAZOLE SODIUM 40 MG PO TBEC
40.0000 mg | DELAYED_RELEASE_TABLET | Freq: Every day | ORAL | 0 refills | Status: DC
  Filled 2024-02-27 (×2): qty 90, 90d supply, fill #0

## 2024-02-27 MED ORDER — LISINOPRIL 10 MG PO TABS
10.0000 mg | ORAL_TABLET | Freq: Every day | ORAL | 1 refills | Status: DC
  Filled 2024-02-27 (×2): qty 90, 90d supply, fill #0

## 2024-03-01 ENCOUNTER — Other Ambulatory Visit (HOSPITAL_COMMUNITY): Payer: Self-pay

## 2024-03-01 ENCOUNTER — Other Ambulatory Visit (HOSPITAL_COMMUNITY): Payer: Self-pay | Admitting: Family Medicine

## 2024-03-01 ENCOUNTER — Ambulatory Visit (HOSPITAL_COMMUNITY)
Admission: RE | Admit: 2024-03-01 | Discharge: 2024-03-01 | Disposition: A | Discharge status: Home / Self Care | Source: Ambulatory Visit | Attending: Family Medicine | Admitting: Family Medicine

## 2024-03-01 DIAGNOSIS — M25552 Pain in left hip: Secondary | ICD-10-CM | POA: Insufficient documentation

## 2024-03-04 ENCOUNTER — Other Ambulatory Visit (HOSPITAL_BASED_OUTPATIENT_CLINIC_OR_DEPARTMENT_OTHER): Payer: Self-pay

## 2024-03-05 ENCOUNTER — Other Ambulatory Visit (HOSPITAL_COMMUNITY): Payer: Self-pay

## 2024-03-05 MED ORDER — METHOCARBAMOL 500 MG PO TABS
500.0000 mg | ORAL_TABLET | Freq: Three times a day (TID) | ORAL | 1 refills | Status: AC | PRN
  Filled 2024-03-05: qty 540, 90d supply, fill #0
  Filled 2024-12-20: qty 540, 90d supply, fill #1

## 2024-03-10 ENCOUNTER — Encounter (HOSPITAL_COMMUNITY): Payer: Self-pay | Admitting: Obstetrics & Gynecology

## 2024-03-10 ENCOUNTER — Other Ambulatory Visit (HOSPITAL_COMMUNITY): Payer: Self-pay | Admitting: Obstetrics & Gynecology

## 2024-03-10 DIAGNOSIS — Z1231 Encounter for screening mammogram for malignant neoplasm of breast: Secondary | ICD-10-CM

## 2024-04-05 ENCOUNTER — Ambulatory Visit (HOSPITAL_COMMUNITY)
Admission: RE | Admit: 2024-04-05 | Discharge: 2024-04-05 | Disposition: A | Discharge status: Home / Self Care | Source: Ambulatory Visit | Attending: Obstetrics & Gynecology | Admitting: Obstetrics & Gynecology

## 2024-04-05 ENCOUNTER — Encounter (HOSPITAL_COMMUNITY): Payer: Self-pay

## 2024-04-05 DIAGNOSIS — Z1231 Encounter for screening mammogram for malignant neoplasm of breast: Secondary | ICD-10-CM | POA: Insufficient documentation

## 2024-04-12 ENCOUNTER — Other Ambulatory Visit (HOSPITAL_COMMUNITY): Payer: Self-pay

## 2024-04-12 ENCOUNTER — Other Ambulatory Visit: Payer: Self-pay

## 2024-04-12 ENCOUNTER — Other Ambulatory Visit: Payer: Self-pay | Admitting: Family Medicine

## 2024-04-12 DIAGNOSIS — F17210 Nicotine dependence, cigarettes, uncomplicated: Secondary | ICD-10-CM

## 2024-04-12 MED ORDER — PANTOPRAZOLE SODIUM 40 MG PO TBEC
40.0000 mg | DELAYED_RELEASE_TABLET | Freq: Every day | ORAL | 3 refills | Status: AC
  Filled 2024-09-27: qty 90, 90d supply, fill #0
  Filled 2024-12-20: qty 90, 90d supply, fill #1

## 2024-04-12 MED ORDER — MONTELUKAST SODIUM 10 MG PO TABS
10.0000 mg | ORAL_TABLET | Freq: Every day | ORAL | 3 refills | Status: AC
  Filled 2024-09-27: qty 90, 90d supply, fill #0
  Filled 2024-12-20: qty 90, 90d supply, fill #1

## 2024-04-12 MED ORDER — LISINOPRIL 10 MG PO TABS
10.0000 mg | ORAL_TABLET | Freq: Every day | ORAL | 3 refills | Status: AC
  Filled 2024-04-12: qty 90, 90d supply, fill #0
  Filled 2024-05-31 – 2024-09-27 (×2): qty 90, 90d supply, fill #1

## 2024-04-12 MED ORDER — DULOXETINE HCL 60 MG PO CPEP
60.0000 mg | ORAL_CAPSULE | Freq: Every day | ORAL | 3 refills | Status: DC
  Filled 2024-09-27: qty 90, 90d supply, fill #0

## 2024-04-12 MED ORDER — MELOXICAM 15 MG PO TABS
15.0000 mg | ORAL_TABLET | Freq: Every day | ORAL | 3 refills | Status: AC
  Filled 2024-04-12: qty 90, 90d supply, fill #0
  Filled 2024-07-07 (×2): qty 90, 90d supply, fill #1
  Filled 2024-09-27: qty 90, 90d supply, fill #2
  Filled 2024-12-20: qty 90, 90d supply, fill #3

## 2024-04-13 ENCOUNTER — Other Ambulatory Visit (HOSPITAL_COMMUNITY): Payer: Self-pay

## 2024-04-13 MED ORDER — ESZOPICLONE 3 MG PO TABS
3.0000 mg | ORAL_TABLET | Freq: Every day | ORAL | 0 refills | Status: AC | PRN
  Filled 2024-04-13: qty 90, 90d supply, fill #0

## 2024-04-13 MED ORDER — HYDROMORPHONE HCL 2 MG PO TABS
2.0000 mg | ORAL_TABLET | Freq: Three times a day (TID) | ORAL | 0 refills | Status: AC
  Filled 2024-04-13: qty 15, 5d supply, fill #0

## 2024-04-13 MED ORDER — GABAPENTIN 100 MG PO CAPS
100.0000 mg | ORAL_CAPSULE | Freq: Three times a day (TID) | ORAL | 2 refills | Status: DC
  Filled 2024-04-13: qty 270, 90d supply, fill #0
  Filled 2024-05-31 – 2024-07-07 (×3): qty 270, 90d supply, fill #1
  Filled 2024-09-27: qty 270, 90d supply, fill #2

## 2024-04-26 ENCOUNTER — Ambulatory Visit
Admission: RE | Admit: 2024-04-26 | Discharge: 2024-04-26 | Disposition: A | Discharge status: Home / Self Care | Source: Ambulatory Visit | Attending: Family Medicine | Admitting: Family Medicine

## 2024-04-26 DIAGNOSIS — Z122 Encounter for screening for malignant neoplasm of respiratory organs: Secondary | ICD-10-CM

## 2024-05-31 ENCOUNTER — Other Ambulatory Visit (HOSPITAL_COMMUNITY): Payer: Self-pay

## 2024-07-07 ENCOUNTER — Other Ambulatory Visit (HOSPITAL_COMMUNITY): Payer: Self-pay

## 2024-08-11 ENCOUNTER — Encounter (HOSPITAL_BASED_OUTPATIENT_CLINIC_OR_DEPARTMENT_OTHER): Payer: Self-pay | Admitting: Cardiology

## 2024-08-11 ENCOUNTER — Ambulatory Visit (HOSPITAL_BASED_OUTPATIENT_CLINIC_OR_DEPARTMENT_OTHER): Admitting: Cardiology

## 2024-08-11 VITALS — BP 130/60 | HR 54 | Resp 17 | Ht 59.0 in | Wt 112.0 lb

## 2024-08-11 DIAGNOSIS — I5032 Chronic diastolic (congestive) heart failure: Secondary | ICD-10-CM | POA: Diagnosis not present

## 2024-08-11 DIAGNOSIS — I10 Essential (primary) hypertension: Secondary | ICD-10-CM

## 2024-08-11 DIAGNOSIS — Z952 Presence of prosthetic heart valve: Secondary | ICD-10-CM

## 2024-08-11 DIAGNOSIS — Z7189 Other specified counseling: Secondary | ICD-10-CM
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
## 2024-08-11 NOTE — Patient Instructions (Signed)
 Medication Instructions:   Your physician recommends that you continue on your current medications as directed. Please refer to the Current Medication list given to you today.  *If you need a refill on your cardiac medications before your next appointment, please call your pharmacy*    Follow-Up: At [facility], you and your health needs are our priority.  As part of our continuing mission to provide you with exceptional heart care, our providers are all part of one team.  This team includes your primary Cardiologist (physician) and Advanced Practice Providers or APPs (Physician Assistants and Nurse Practitioners) who all work together to provide you with the care you need, when you need it.  Your next appointment:   1 year(s)  Provider:   Shelda Bruckner, MD, Rosaline Bane, NP, or Reche Finder, NP

## 2024-08-17 ENCOUNTER — Other Ambulatory Visit (HOSPITAL_COMMUNITY): Payer: Self-pay

## 2024-08-17 ENCOUNTER — Other Ambulatory Visit: Payer: Self-pay | Admitting: Nurse Practitioner

## 2024-08-18 ENCOUNTER — Other Ambulatory Visit (HOSPITAL_COMMUNITY): Payer: Self-pay

## 2024-08-18 ENCOUNTER — Other Ambulatory Visit: Payer: Self-pay

## 2024-08-18 MED ORDER — METOPROLOL TARTRATE 25 MG PO TABS
12.5000 mg | ORAL_TABLET | Freq: Two times a day (BID) | ORAL | 3 refills | Status: AC
  Filled 2024-08-18: qty 90, 90d supply, fill #0
  Filled 2024-11-22: qty 90, 90d supply, fill #1
  Filled 2024-12-20: qty 90, 90d supply, fill #2

## 2024-09-27 ENCOUNTER — Other Ambulatory Visit (HOSPITAL_COMMUNITY): Payer: Self-pay

## 2024-09-28 ENCOUNTER — Other Ambulatory Visit (HOSPITAL_COMMUNITY): Payer: Self-pay

## 2024-09-28 ENCOUNTER — Other Ambulatory Visit: Payer: Self-pay

## 2024-09-28 MED ORDER — DULOXETINE HCL 60 MG PO CPEP
60.0000 mg | ORAL_CAPSULE | Freq: Every day | ORAL | 0 refills | Status: DC
  Filled 2024-09-28: qty 90, 90d supply, fill #0

## 2024-09-28 MED ORDER — LISINOPRIL 10 MG PO TABS
10.0000 mg | ORAL_TABLET | Freq: Every day | ORAL | 0 refills | Status: AC
  Filled 2024-09-28: qty 90, 90d supply, fill #0

## 2024-10-13 ENCOUNTER — Other Ambulatory Visit (HOSPITAL_COMMUNITY): Payer: Self-pay

## 2024-10-13 MED ORDER — DULOXETINE HCL 60 MG PO CPEP
60.0000 mg | ORAL_CAPSULE | Freq: Every day | ORAL | 3 refills | Status: AC
  Filled 2024-10-13: qty 90, 90d supply, fill #0

## 2024-10-13 MED ORDER — LISINOPRIL 10 MG PO TABS
10.0000 mg | ORAL_TABLET | Freq: Every day | ORAL | 3 refills | Status: AC
  Filled 2024-10-13 – 2024-12-20 (×2): qty 90, 90d supply, fill #0

## 2024-11-02 ENCOUNTER — Encounter (HOSPITAL_BASED_OUTPATIENT_CLINIC_OR_DEPARTMENT_OTHER): Payer: Self-pay | Admitting: Obstetrics and Gynecology

## 2024-11-02 ENCOUNTER — Ambulatory Visit (HOSPITAL_BASED_OUTPATIENT_CLINIC_OR_DEPARTMENT_OTHER): Admitting: Obstetrics and Gynecology

## 2024-11-02 VITALS — BP 159/80 | HR 57 | Ht 59.5 in | Wt 116.2 lb

## 2024-11-02 DIAGNOSIS — N3941 Urge incontinence: Secondary | ICD-10-CM

## 2024-11-02 DIAGNOSIS — Z8619 Personal history of other infectious and parasitic diseases: Secondary | ICD-10-CM

## 2024-11-02 DIAGNOSIS — N393 Stress incontinence (female) (male): Secondary | ICD-10-CM

## 2024-11-02 DIAGNOSIS — N3946 Mixed incontinence: Secondary | ICD-10-CM

## 2024-11-02 DIAGNOSIS — A609 Anogenital herpesviral infection, unspecified: Secondary | ICD-10-CM

## 2024-11-02 NOTE — Progress Notes (Signed)
   GYNECOLOGY PROGRESS NOTE  History:  68 y.o. G1P1001 presents to [facility] for growth around rectum. Denies pain or burning. Denies constipation Has history of herpes since the 70s, has acyclovir , takes off and on daily   Also reports incontinence, cannot hold urine, sometimes she can make it to the bathroom, sometimes she cannot.    Review of Systems:  Pertinent items are noted in HPI.   Objective:  Physical Exam Blood pressure (!) 159/80, pulse (!) 57, height 4' 11.5 (1.511 m), weight 116 lb 3.2 oz (52.7 kg), last menstrual period 11/25/2001. VS reviewed, nursing note reviewed,  Constitutional: well developed, well nourished, no distress HEENT: normocephalic CV: normal rate Pulm/chest wall: normal effort Breast Exam: deferred Abdomen: soft Neuro: alert and oriented x 3 Skin: warm, dry Psych: affect normal Pelvic exam:  external genitalia normal, no lesions present  Rectum: no wart present currently, no lesion present    Assessment & Plan:  1. Urge incontinence (Primary) 2. Stress incontinence Does not desire medication, interested in pelvic PT, referral placed today   3. History of HPV infection 4. HSV (herpes simplex virus) anogenital infection No lesion or wart present on exam, monitor symptoms and follow up if worsens. Continue daily acyclovir  for HSV suppression  - Ambulatory referral to Physical Therapy   Future Appointments  Date Time Provider Department Center  12/06/2024 10:55 AM Cleotilde Ronal RAMAN, MD [facility] [phone] [place]     Nidia Daring, FNP

## 2024-11-22 ENCOUNTER — Other Ambulatory Visit (HOSPITAL_COMMUNITY): Payer: Self-pay

## 2024-12-06 ENCOUNTER — Ambulatory Visit (HOSPITAL_BASED_OUTPATIENT_CLINIC_OR_DEPARTMENT_OTHER): Payer: Medicare Other | Admitting: Obstetrics & Gynecology

## 2024-12-06 NOTE — Progress Notes (Unsigned)
" ° °  ANNUAL EXAM Patient name: Jessica Ramsey MRN [id number]  Date of birth: 1956-10-27 Chief Complaint:   No chief complaint on file.  History of Present Illness:   Jessica Ramsey is a 69 y.o. G62P1001 Caucasian female being seen today for a routine annual exam.  Current complaints: ***  Patient's last menstrual period was 11/25/2001.   The pregnancy intention screening data noted above was reviewed. Potential methods of contraception were discussed. The patient elected to proceed with No data recorded.   Last pap 12/02/2023. Results were: NILM w/ HRHPV positive: type not specified. H/O abnormal pap: {yes/yes***/no:23866} Last mammogram: 04/05/2024. Results were: normal. Family h/o breast cancer: {yes***/no:23838} Last colonoscopy: 08/01/2017. Results were: abnormal 1 polyp. Family h/o colorectal cancer: {yes***/no:23838}     12/02/2023    2:58 PM 10/22/2022    9:52 AM 09/25/2022    9:17 AM 09/13/2022   11:15 AM 05/10/2022   11:52 AM  Depression screen PHQ 2/9  Decreased Interest 0 0 0 0 0  Down, Depressed, Hopeless 0 0 0 0 0  PHQ - 2 Score 0 0 0 0 0         No data to display           Review of Systems:   Pertinent items are noted in HPI Denies any headaches, blurred vision, fatigue, shortness of breath, chest pain, abdominal pain, abnormal vaginal discharge/itching/odor/irritation, problems with periods, bowel movements, urination, or intercourse unless otherwise stated above. Pertinent History Reviewed:  Reviewed past medical,surgical, social and family history.  Reviewed problem list, medications and allergies. Physical Assessment:  There were no vitals filed for this visit.There is no height or weight on file to calculate BMI.        Physical Examination:   General appearance - well appearing, and in no distress  Mental status - alert, oriented to person, place, and time  Psych:  She has a normal mood and affect  Skin - warm and dry, normal color, no suspicious  lesions noted  Chest - effort normal, all lung fields clear to auscultation bilaterally  Heart - normal rate and regular rhythm  Neck:  midline trachea, no thyromegaly or nodules  Breasts - breasts appear normal, no suspicious masses, no skin or nipple changes or  axillary nodes  Abdomen - soft, nontender, nondistended, no masses or organomegaly  Pelvic - VULVA: normal appearing vulva with no masses, tenderness or lesions  VAGINA: normal appearing vagina with normal color and discharge, no lesions  CERVIX: normal appearing cervix without discharge or lesions, no CMT  Thin prep pap is {Desc; done/not:10129} *** HR HPV cotesting  UTERUS: uterus is felt to be normal size, shape, consistency and nontender   ADNEXA: No adnexal masses or tenderness noted.  Rectal - normal rectal, good sphincter tone, no masses felt. Hemoccult: ***  Extremities:  No swelling or varicosities noted  Chaperone present for exam  No results found for this or any previous visit (from the past 24 hours).  Assessment & Plan:  1) Well-Woman Exam  2) ***  Labs/procedures today: ***  Mammogram: {Mammo f/u:25212::@ 69yo}, or sooner if problems Colonoscopy: {TCS f/u:25213::@ 69yo}, or sooner if problems  No orders of the defined types were placed in this encounter.   Meds: No orders of the defined types were placed in this encounter.   Follow-up: No follow-ups on file.  Morna LOISE Quale, RN 12/06/2024 8:11 AM "

## 2024-12-16 ENCOUNTER — Other Ambulatory Visit (HOSPITAL_COMMUNITY)
Admission: RE | Admit: 2024-12-16 | Discharge: 2024-12-16 | Disposition: A | Source: Ambulatory Visit | Attending: Obstetrics & Gynecology | Admitting: Obstetrics & Gynecology

## 2024-12-16 ENCOUNTER — Encounter (HOSPITAL_BASED_OUTPATIENT_CLINIC_OR_DEPARTMENT_OTHER): Payer: Self-pay | Admitting: Obstetrics & Gynecology

## 2024-12-16 ENCOUNTER — Ambulatory Visit (HOSPITAL_BASED_OUTPATIENT_CLINIC_OR_DEPARTMENT_OTHER): Payer: Self-pay | Admitting: Obstetrics & Gynecology

## 2024-12-16 VITALS — BP 138/61 | HR 48 | Ht 59.5 in | Wt 113.0 lb

## 2024-12-16 DIAGNOSIS — R87613 High grade squamous intraepithelial lesion on cytologic smear of cervix (HGSIL): Secondary | ICD-10-CM

## 2024-12-16 DIAGNOSIS — Z90710 Acquired absence of both cervix and uterus: Secondary | ICD-10-CM | POA: Diagnosis not present

## 2024-12-16 DIAGNOSIS — Z1151 Encounter for screening for human papillomavirus (HPV): Secondary | ICD-10-CM | POA: Insufficient documentation

## 2024-12-16 DIAGNOSIS — A609 Anogenital herpesviral infection, unspecified: Secondary | ICD-10-CM | POA: Diagnosis not present

## 2024-12-16 DIAGNOSIS — Z124 Encounter for screening for malignant neoplasm of cervix: Secondary | ICD-10-CM | POA: Insufficient documentation

## 2024-12-16 DIAGNOSIS — N3946 Mixed incontinence: Secondary | ICD-10-CM | POA: Diagnosis not present

## 2024-12-16 DIAGNOSIS — Z01419 Encounter for gynecological examination (general) (routine) without abnormal findings: Secondary | ICD-10-CM | POA: Diagnosis not present

## 2024-12-16 DIAGNOSIS — B977 Papillomavirus as the cause of diseases classified elsewhere: Secondary | ICD-10-CM | POA: Insufficient documentation

## 2024-12-16 DIAGNOSIS — Z9189 Other specified personal risk factors, not elsewhere classified: Secondary | ICD-10-CM
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
## 2024-12-16 NOTE — Progress Notes (Signed)
 "  Breast and Pelvic Exam Patient name: Jessica Ramsey MRN [id number]  Date of birth: [birth year] Chief Complaint:   H/o HR HPV  History of Present Illness:   Jessica Ramsey is a 69 y.o. G87P1001 Caucasian female being seen today for breast and pelvic exam.  H/o HR HPV.  S/p TLH.  Denies vaginal bleeding.  Was referred to pelvic PT due to urinary incontinence.  Had back surgery which really helped with pain.  Off pain medications for more than 3 months now.  Congratulated her regarding this.  Having incontinence at times when she doesn't even realize it.  Denies vaginal bleeding.  Has place on buttocks she thinks is HSV outbreak.  Taking antiviral daily.    Patient's last menstrual period was 11/25/2001.  Last pap 12/02/2023. Results were: NILM w/ HRHPV positive: type not specified. H/O abnormal pap: yes Last mammogram: 04/05/2024. Results were: normal. Family h/o breast cancer: no Last colonoscopy: 08/01/2017. Results were: abnormal 1 polyp. Family h/o colorectal cancer: no     12/02/2023    2:58 PM 10/22/2022    9:52 AM 09/25/2022    9:17 AM 09/13/2022   11:15 AM 05/10/2022   11:52 AM  Depression screen PHQ 2/9  Decreased Interest 0 0 0 0 0  Down, Depressed, Hopeless 0 0 0 0 0  PHQ - 2 Score 0 0 0 0 0     Review of Systems:   Pertinent items are noted in HPI Denies any bowel changes or pelvic pain Pertinent History Reviewed:  Reviewed past medical,surgical, social and family history.  Reviewed problem list, medications and allergies. Physical Assessment:   Vitals:   12/16/24 1434  BP: 138/61  Pulse: (!) 48  Weight: 113 lb (51.3 kg)  Height: 4' 11.5 (1.511 m)  Body mass index is 22.44 kg/m.        Physical Examination:   General appearance - well appearing, and in no distress  Mental status - alert, oriented to person, place, and time  Psych:  She has a normal mood and affect  Skin - warm and dry, normal color, no suspicious lesions noted  Chest - effort normal, all  lung fields clear to auscultation bilaterally  Heart - normal rate and regular rhythm  Neck:  midline trachea, no thyromegaly or nodules  Breasts - breasts appear normal, no suspicious masses, no skin or nipple changes or  axillary nodes  Abdomen - soft, nontender, nondistended, no masses or organomegaly  Pelvic - VULVA: normal appearing vulva with no masses, tenderness or lesions, buttocks lesion does not have vesicles and is not c/w HSV lesion  VAGINA: atrophic changes, no masses CERVIX: surgically absent  Thin prep pap is done with HR HPV cotesting  UTERUS: surgically absent  ADNEXA: No adnexal masses or tenderness noted.  Rectal - normal rectal, good sphincter tone, no masses felt  Extremities:  No swelling or varicosities noted  Chaperone present for exam  No results found for this or any previous visit (from the past 24 hours).  Assessment & Plan:  1. GYN exam for high-risk Medicare patient (Primary) - Pap smear updated today - Mammogram 04/05/2024 - Colonoscopy 08/01/2017.  Follow up 10 years per pt.  Have colonoscopy report but no pathology - Bone mineral density 08/2023 - lab work done with PCP, Monica Aho - vaccines reviewed/updated  2. HSV (herpes simplex virus) anogenital infection - on acyclovir  40mmg bid.  RX just done for pt earlier this month.  Does not need RF  at this time.   3. High risk HPV infection - Cytology - PAP( [place])  4. Mixed incontinence urge and stress - Ambulatory referral to Urogynecology  5. H/O: hysterectomy  Orders Placed This Encounter  Procedures   Ambulatory referral to Urogynecology    Meds: No orders of the defined types were placed in this encounter.   Follow-up: pending pap smear results  Ronal GORMAN Pinal, MD 12/19/2024 4:36 AM  "

## 2024-12-19 ENCOUNTER — Encounter (HOSPITAL_BASED_OUTPATIENT_CLINIC_OR_DEPARTMENT_OTHER): Payer: Self-pay | Admitting: Obstetrics & Gynecology

## 2024-12-19 DIAGNOSIS — Z90710 Acquired absence of both cervix and uterus: Secondary | ICD-10-CM | POA: Insufficient documentation

## 2024-12-19 DIAGNOSIS — N3946 Mixed incontinence: Secondary | ICD-10-CM | POA: Insufficient documentation

## 2024-12-20 ENCOUNTER — Other Ambulatory Visit (HOSPITAL_COMMUNITY): Payer: Self-pay

## 2024-12-21 ENCOUNTER — Other Ambulatory Visit (HOSPITAL_COMMUNITY): Payer: Self-pay

## 2024-12-21 ENCOUNTER — Other Ambulatory Visit: Payer: Self-pay

## 2024-12-21 MED ORDER — GABAPENTIN 100 MG PO CAPS
100.0000 mg | ORAL_CAPSULE | Freq: Three times a day (TID) | ORAL | 2 refills | Status: AC
  Filled 2024-12-21: qty 270, 90d supply, fill #0

## 2024-12-22 ENCOUNTER — Other Ambulatory Visit: Payer: Self-pay

## 2024-12-22 LAB — CYTOLOGY - PAP: Diagnosis: NEGATIVE
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
## 2024-12-31 ENCOUNTER — Ambulatory Visit: Admitting: Physical Therapy

## 2024-12-31 ENCOUNTER — Encounter: Payer: Self-pay | Admitting: Physical Therapy

## 2024-12-31 DIAGNOSIS — R278 Other lack of coordination: Secondary | ICD-10-CM

## 2024-12-31 DIAGNOSIS — M6281 Muscle weakness (generalized): Secondary | ICD-10-CM

## 2025-01-12 ENCOUNTER — Ambulatory Visit (HOSPITAL_BASED_OUTPATIENT_CLINIC_OR_DEPARTMENT_OTHER): Payer: Self-pay | Admitting: Obstetrics & Gynecology

## 2025-01-31 ENCOUNTER — Ambulatory Visit: Admitting: Physical Therapy

## 2025-02-09 ENCOUNTER — Ambulatory Visit: Admitting: Physical Therapy

## 2025-02-14 ENCOUNTER — Ambulatory Visit: Admitting: Physical Therapy
